# Patient Record
Sex: Female | Born: 1980 | Race: White | Hispanic: No | Marital: Married | Smoking: Former smoker
Health system: Southern US, Community
[De-identification: ages and names within clinical notes are randomized; demographics above are authoritative.]

## PROBLEM LIST (undated history)

## (undated) DIAGNOSIS — J4540 Moderate persistent asthma, uncomplicated: Secondary | ICD-10-CM

## (undated) DIAGNOSIS — J069 Acute upper respiratory infection, unspecified: Secondary | ICD-10-CM

## (undated) DIAGNOSIS — I10 Essential (primary) hypertension: Secondary | ICD-10-CM

## (undated) DIAGNOSIS — G4730 Sleep apnea, unspecified: Secondary | ICD-10-CM

## (undated) DIAGNOSIS — O039 Complete or unspecified spontaneous abortion without complication: Secondary | ICD-10-CM

## (undated) DIAGNOSIS — D649 Anemia, unspecified: Secondary | ICD-10-CM

## (undated) DIAGNOSIS — K529 Noninfective gastroenteritis and colitis, unspecified: Secondary | ICD-10-CM

## (undated) HISTORY — DX: Moderate persistent asthma, uncomplicated: J45.40

## (undated) HISTORY — DX: Acute upper respiratory infection, unspecified: J06.9

## (undated) HISTORY — DX: Complete or unspecified spontaneous abortion without complication: O03.9

---

## 2002-07-15 HISTORY — PX: COLONOSCOPY: SHX174

## 2016-02-02 ENCOUNTER — Emergency Department (HOSPITAL_COMMUNITY)
Admission: EM | Admit: 2016-02-02 | Discharge: 2016-02-02 | Disposition: A | Discharge status: Home / Self Care | Payer: 59 | Attending: Emergency Medicine | Admitting: Emergency Medicine

## 2016-02-02 ENCOUNTER — Encounter (HOSPITAL_COMMUNITY): Payer: Self-pay | Admitting: *Deleted

## 2016-02-02 ENCOUNTER — Emergency Department (HOSPITAL_COMMUNITY): Payer: 59

## 2016-02-02 DIAGNOSIS — Z87891 Personal history of nicotine dependence: Secondary | ICD-10-CM | POA: Diagnosis not present

## 2016-02-02 DIAGNOSIS — R110 Nausea: Secondary | ICD-10-CM

## 2016-02-02 DIAGNOSIS — R1013 Epigastric pain: Secondary | ICD-10-CM | POA: Insufficient documentation

## 2016-02-02 DIAGNOSIS — R1110 Vomiting, unspecified: Secondary | ICD-10-CM | POA: Diagnosis not present

## 2016-02-02 HISTORY — DX: Noninfective gastroenteritis and colitis, unspecified: K52.9

## 2016-02-02 LAB — COMPREHENSIVE METABOLIC PANEL
ALK PHOS: 76 U/L (ref 38–126)
ALT: 12 U/L — AB (ref 14–54)
AST: 19 U/L (ref 15–41)
Albumin: 3.9 g/dL (ref 3.5–5.0)
Anion gap: 11 (ref 5–15)
BUN: 6 mg/dL (ref 6–20)
CHLORIDE: 106 mmol/L (ref 101–111)
CO2: 21 mmol/L — AB (ref 22–32)
Calcium: 9.3 mg/dL (ref 8.9–10.3)
Creatinine, Ser: 0.73 mg/dL (ref 0.44–1.00)
GFR calc Af Amer: >60 mL/min (ref >60)
GFR calc non Af Amer: >60 mL/min (ref >60)
GLUCOSE: 103 mg/dL — AB (ref 65–99)
Potassium: 3.8 mmol/L (ref 3.5–5.1)
SODIUM: 138 mmol/L (ref 135–145)
Total Bilirubin: 0.6 mg/dL (ref 0.3–1.2)
Total Protein: 7.8 g/dL (ref 6.5–8.1)

## 2016-02-02 LAB — URINALYSIS, ROUTINE W REFLEX MICROSCOPIC
BILIRUBIN URINE: NEGATIVE
GLUCOSE, UA: NEGATIVE mg/dL
HGB URINE DIPSTICK: NEGATIVE
Ketones, ur: NEGATIVE mg/dL
Leukocytes, UA: NEGATIVE
Nitrite: NEGATIVE
PROTEIN: NEGATIVE mg/dL
Specific Gravity, Urine: 1.025 (ref 1.005–1.030)
pH: 5 (ref 5.0–8.0)

## 2016-02-02 LAB — LIPASE, BLOOD: LIPASE: 24 U/L (ref 11–51)

## 2016-02-02 LAB — CBC
HCT: 40.3 % (ref 36.0–46.0)
Hemoglobin: 13.6 g/dL (ref 12.0–15.0)
MCH: 27.3 pg (ref 26.0–34.0)
MCHC: 33.7 g/dL (ref 30.0–36.0)
MCV: 80.9 fL (ref 78.0–100.0)
PLATELETS: 359 10*3/uL (ref 150–400)
RBC: 4.98 MIL/uL (ref 3.87–5.11)
RDW: 14.1 % (ref 11.5–15.5)
WBC: 12.8 10*3/uL — ABNORMAL HIGH (ref 4.0–10.5)

## 2016-02-02 LAB — URINALYSIS, MICROSCOPIC (REFLEX): WBC, UA: NONE SEEN WBC/hpf (ref 0–5)

## 2016-02-02 MED ORDER — FAMOTIDINE 20 MG PO TABS
20.0000 mg | ORAL_TABLET | Freq: Once | ORAL | Status: AC
  Administered 2016-02-02: 20 mg via ORAL
  Filled 2016-02-02: qty 1

## 2016-02-02 MED ORDER — ONDANSETRON 4 MG PO TBDP: 4.0000 mg | ORAL_TABLET | ORAL | 0 refills | Status: DC | PRN

## 2016-02-02 MED ORDER — FAMOTIDINE 20 MG PO TABS: 20.0000 mg | ORAL_TABLET | Freq: Two times a day (BID) | ORAL | 0 refills | Status: DC

## 2016-02-02 MED ORDER — GI COCKTAIL ~~LOC~~
30.0000 mL | Freq: Once | ORAL | Status: AC
  Administered 2016-02-02: 30 mL via ORAL
  Filled 2016-02-02: qty 30

## 2016-02-02 MED FILL — ONDANSETRON ODT 4 MG TABLET: 4 | 4 days supply | Qty: 20 | Fill #0

## 2016-02-02 MED FILL — FAMOTIDINE 20 MG TABLET: 20 | 15 days supply | Qty: 30 | Fill #0

## 2016-02-02 NOTE — ED Triage Notes (Signed)
While at work in [place] pt report at midnight feeling gassy & belching that smelt & tasted like feces, pt then vomited x 1, pt reports having a small BM this morning, pt c/o bil upper abd cramping & bloated feeling, pt c/o pain radiating to R back, pt hx of colitis, ambulatory, A&O x4

## 2016-02-02 NOTE — ED Provider Notes (Signed)
[facility] DEPT Provider Note   CSN: [id number] Arrival date & time: 02/02/16  [id number]     History   Chief Complaint Chief Complaint  Patient presents with  . Abdominal Pain    HPI Molly [name] is a 36 y.o. female.  HPI At approximately midnight patient started to get a bloated and uncomfortable sensation in the upper abdomen and epigastrium. She reports that she was belching and that the smell and taste was that of feces. Patient became concerned for possible obstruction. She did go on to have one episode of vomiting. She reports she had just had some Coca-Cola so it was kind of brownish in appearance. She had a small bowel movement this morning. Patient reports at this time the pain has abated considerably relative to what it had been during the night. Pain was radiating into her back. Patient has a distant history of colitis but not ulcerative or Crohn's. No fever. No GU symptoms. Past Medical History:  Diagnosis Date  . Colitis     There are no active problems to display for this patient.   Past Surgical History:  Procedure Laterality Date  . CESAREAN SECTION      OB History    No data available       Home Medications    Prior to Admission medications   Medication Sig Start Date End Date Taking? Authorizing Provider  Ascorbic Acid (VITAMIN C) 100 MG tablet Take 100 mg by mouth daily.   Yes Historical Provider, MD  ELDERBERRY PO Take 1 tablet by mouth daily.   Yes Historical Provider, MD  ibuprofen (ADVIL,MOTRIN) 200 MG tablet Take 200 mg by mouth every 6 (six) hours as needed for moderate pain.   Yes Historical Provider, MD  Multiple Vitamin (MULTIVITAMIN) tablet Take 1 tablet by mouth daily.   Yes Historical Provider, MD  ondansetron (ZOFRAN) 4 MG tablet Take 4 mg by mouth every 8 (eight) hours as needed for nausea or vomiting.   Yes Historical Provider, MD  famotidine (PEPCID) 20 MG tablet Take 1 tablet (20 mg total) by mouth 2 (two) times daily. 02/02/16   Charlesetta Shanks, MD  ondansetron (ZOFRAN ODT) 4 MG disintegrating tablet Take 1 tablet (4 mg total) by mouth every 4 (four) hours as needed for nausea or vomiting. 02/02/16   Charlesetta Shanks, MD    Family History No family history on file.  Social History Social History  Substance Use Topics  . Smoking status: Former Smoker    Types: Cigarettes    Quit date: 02/15/2011  . Smokeless tobacco: Never Used  . Alcohol use 0.6 oz/week    1 Cans of beer per week     Allergies   Lavender oil   Review of Systems Review of Systems 10 Systems reviewed and are negative for acute change except as noted in the HPI.   Physical Exam Updated Vital Signs BP 106/84   Pulse 78   Temp 98 F (36.7 C) (Oral)   Resp 16   Ht 5\' 6"  (1.676 m)   Wt 250 lb (113.4 kg)   LMP 01/12/2016 (Exact Date)   SpO2 98%   BMI 40.35 kg/m   Physical Exam  Constitutional: She is oriented to person, place, and time. She appears well-developed and well-nourished. No distress.  HENT:  Head: Normocephalic and atraumatic.  Eyes: Conjunctivae and EOM are normal.  Neck: Neck supple.  Cardiovascular: Normal rate and regular rhythm.   No murmur heard. Pulmonary/Chest: Effort normal and breath sounds  normal. No respiratory distress.  Abdominal: Soft. She exhibits no distension. There is tenderness. There is no guarding.  Mild epigastric tenderness without guarding. Lower abdomen is nontender.  Musculoskeletal: She exhibits no edema or tenderness.  Neurological: She is alert and oriented to person, place, and time. She exhibits normal muscle tone. Coordination normal.  Skin: Skin is warm and dry.  Psychiatric: She has a normal mood and affect.  Nursing note and vitals reviewed.    ED Treatments / Results  Labs (all labs ordered are listed, but only abnormal results are displayed) Labs Reviewed  COMPREHENSIVE METABOLIC PANEL - Abnormal; Notable for the following:       Result Value   CO2 21 (*)    Glucose, Bld 103  (*)    ALT 12 (*)    All other components within normal limits  CBC - Abnormal; Notable for the following:    WBC 12.8 (*)    All other components within normal limits  URINALYSIS, ROUTINE W REFLEX MICROSCOPIC - Abnormal; Notable for the following:    APPearance TURBID (*)    All other components within normal limits  URINALYSIS, MICROSCOPIC (REFLEX) - Abnormal; Notable for the following:    Bacteria, UA RARE (*)    Squamous Epithelial / LPF 0-5 (*)    All other components within normal limits  LIPASE, BLOOD    EKG  EKG Interpretation None       Radiology Dg Abd Acute W/chest  Result Date: 02/02/2016 CLINICAL DATA:  Acute epigastric abdominal pain, nausea. EXAM: DG ABDOMEN ACUTE W/ 1V CHEST COMPARISON:  None. FINDINGS: There is no evidence of dilated bowel loops or free intraperitoneal air. Phleboliths are noted in the pelvis. Heart size and mediastinal contours are within normal limits. Both lungs are clear. IMPRESSION: There is no evidence of bowel obstruction or ileus. No acute cardiopulmonary disease. Electronically Signed   By: Marijo Conception, M.D.   On: 02/02/2016 10:06    Procedures Procedures (including critical care time)  Medications Ordered in ED Medications  gi cocktail (Maalox,Lidocaine,Donnatal) (30 mLs Oral Given 02/02/16 0946)  famotidine (PEPCID) tablet 20 mg (20 mg Oral Given 02/02/16 0946)     Initial Impression / Assessment and Plan / ED Course  I have reviewed the triage vital signs and the nursing notes.  Pertinent labs & imaging results that were available during my care of the patient were reviewed by me and considered in my medical decision making (see chart for details).  Clinical Course      Final Clinical Impressions(s) / ED Diagnoses   Final diagnoses:  Epigastric pain  Nausea  Pain and nausea have improved significantly. Acute abdominal series shows no signs of obstructive pattern. At this time, abdominal pain precautions are reviewed.  Patient will continue Pepcid for the next week and has Zofran to use when necessary. Return precautions are reviewed.  New Prescriptions New Prescriptions   FAMOTIDINE (PEPCID) 20 MG TABLET    Take 1 tablet (20 mg total) by mouth 2 (two) times daily.   ONDANSETRON (ZOFRAN ODT) 4 MG DISINTEGRATING TABLET    Take 1 tablet (4 mg total) by mouth every 4 (four) hours as needed for nausea or vomiting.     Charlesetta Shanks, MD 02/02/16 1025

## 2016-04-22 DIAGNOSIS — J019 Acute sinusitis, unspecified: Secondary | ICD-10-CM | POA: Diagnosis not present

## 2016-05-04 ENCOUNTER — Encounter (HOSPITAL_COMMUNITY): Payer: Self-pay | Admitting: Emergency Medicine

## 2016-05-04 ENCOUNTER — Emergency Department (HOSPITAL_COMMUNITY)
Admission: EM | Admit: 2016-05-04 | Discharge: 2016-05-04 | Disposition: A | Discharge status: Home / Self Care | Payer: 59 | Attending: Emergency Medicine | Admitting: Emergency Medicine

## 2016-05-04 DIAGNOSIS — Z87891 Personal history of nicotine dependence: Secondary | ICD-10-CM | POA: Diagnosis not present

## 2016-05-04 DIAGNOSIS — R112 Nausea with vomiting, unspecified: Secondary | ICD-10-CM | POA: Diagnosis not present

## 2016-05-04 DIAGNOSIS — R197 Diarrhea, unspecified: Secondary | ICD-10-CM | POA: Insufficient documentation

## 2016-05-04 HISTORY — DX: Anemia, unspecified: D64.9

## 2016-05-04 LAB — CBC
HEMATOCRIT: 42.3 % (ref 36.0–46.0)
HEMOGLOBIN: 13.7 g/dL (ref 12.0–15.0)
MCH: 26.2 pg (ref 26.0–34.0)
MCHC: 32.4 g/dL (ref 30.0–36.0)
MCV: 80.9 fL (ref 78.0–100.0)
Platelets: 366 10*3/uL (ref 150–400)
RBC: 5.23 MIL/uL — ABNORMAL HIGH (ref 3.87–5.11)
RDW: 14.2 % (ref 11.5–15.5)
WBC: 14.3 10*3/uL — ABNORMAL HIGH (ref 4.0–10.5)

## 2016-05-04 LAB — URINALYSIS, ROUTINE W REFLEX MICROSCOPIC
Bilirubin Urine: NEGATIVE
GLUCOSE, UA: NEGATIVE mg/dL
Hgb urine dipstick: NEGATIVE
KETONES UR: NEGATIVE mg/dL
LEUKOCYTES UA: NEGATIVE
NITRITE: NEGATIVE
PH: 5 (ref 5.0–8.0)
Protein, ur: NEGATIVE mg/dL
SPECIFIC GRAVITY, URINE: 1.023 (ref 1.005–1.030)

## 2016-05-04 LAB — COMPREHENSIVE METABOLIC PANEL
ALBUMIN: 3.9 g/dL (ref 3.5–5.0)
ALT: 16 U/L (ref 14–54)
AST: 19 U/L (ref 15–41)
Alkaline Phosphatase: 79 U/L (ref 38–126)
Anion gap: 14 (ref 5–15)
BILIRUBIN TOTAL: 0.7 mg/dL (ref 0.3–1.2)
BUN: 5 mg/dL — AB (ref 6–20)
CHLORIDE: 106 mmol/L (ref 101–111)
CO2: 18 mmol/L — ABNORMAL LOW (ref 22–32)
CREATININE: 0.67 mg/dL (ref 0.44–1.00)
Calcium: 9.5 mg/dL (ref 8.9–10.3)
GFR calc Af Amer: >60 mL/min (ref >60)
GFR calc non Af Amer: >60 mL/min (ref >60)
GLUCOSE: 109 mg/dL — AB (ref 65–99)
POTASSIUM: 4.1 mmol/L (ref 3.5–5.1)
Sodium: 138 mmol/L (ref 135–145)
TOTAL PROTEIN: 7.4 g/dL (ref 6.5–8.1)

## 2016-05-04 LAB — LIPASE, BLOOD: Lipase: 19 U/L (ref 11–51)

## 2016-05-04 LAB — POC URINE PREG, ED: Preg Test, Ur: NEGATIVE

## 2016-05-04 MED ORDER — ONDANSETRON HCL 4 MG/2ML IJ SOLN
4.0000 mg | Freq: Once | INTRAMUSCULAR | Status: AC
  Administered 2016-05-04: 4 mg via INTRAVENOUS
  Filled 2016-05-04: qty 2

## 2016-05-04 MED ORDER — ONDANSETRON 4 MG PO TBDP: 4.0000 mg | ORAL_TABLET | Freq: Three times a day (TID) | ORAL | 0 refills | Status: DC | PRN

## 2016-05-04 MED ORDER — SODIUM CHLORIDE 0.9 % IV BOLUS (SEPSIS)
1000.0000 mL | Freq: Once | INTRAVENOUS | Status: AC
  Administered 2016-05-04: 1000 mL via INTRAVENOUS

## 2016-05-04 NOTE — ED Triage Notes (Signed)
Started having n/v/d around 2:30 today.  Vomited 3 times and had 6-7 diarrhea stools so far.  C/O abdominal cramping.

## 2016-05-04 NOTE — ED Provider Notes (Signed)
[facility] DEPT Provider Note   CSN: [id number] Arrival date & time: 05/04/16  1916     History   Chief Complaint Chief Complaint  Patient presents with  . Nausea  . Emesis    HPI Molly [name] is a 36 y.o. female.  36 year old obese female who presents with acute onset of nausea vomiting and diarrhea slight abdominal crampy pain denies any fever or dysuria. Patient has history of anemia during pregnancy and colitis. She's had no issues with her colitis in quite a while denies any blood or mucus in her stool. She works in the medical field/hospital.      Past Medical History:  Diagnosis Date  . Anemia   . Colitis     There are no active problems to display for this patient.   Past Surgical History:  Procedure Laterality Date  . CESAREAN SECTION      OB History    No data available       Home Medications    Prior to Admission medications   Medication Sig Start Date End Date Taking? Authorizing Provider  Bioflavonoid Products (VITAMIN C) CHEW Chew 2-3 tablets by mouth daily.   Yes Historical Provider, MD  brompheniramine-pseudoephedrine-DM 30-2-10 MG/5ML syrup Take 30 mLs by mouth every 4 (four) hours as needed (cough).  04/22/16  Yes Historical Provider, MD  cetirizine (ZYRTEC) 10 MG tablet Take 10 mg by mouth daily as needed (seasonal allergies).   Yes Historical Provider, MD  ELDERBERRY PO Take 1 tablet by mouth daily.   Yes Historical Provider, MD  ibuprofen (ADVIL,MOTRIN) 200 MG tablet Take 600 mg by mouth every 6 (six) hours as needed for moderate pain.    Yes Historical Provider, MD  Multiple Vitamin (MULTIVITAMIN WITH MINERALS) TABS tablet Take 1 tablet by mouth daily.   Yes Historical Provider, MD  ondansetron (ZOFRAN ODT) 4 MG disintegrating tablet Take 1 tablet (4 mg total) by mouth every 4 (four) hours as needed for nausea or vomiting. 02/02/16  Yes Charlesetta Shanks, MD  RaNITidine HCl (ZANTAC PO) Take 1 tablet by mouth daily as needed (acid reflux).    Yes Historical Provider, MD  famotidine (PEPCID) 20 MG tablet Take 1 tablet (20 mg total) by mouth 2 (two) times daily. Patient not taking: Reported on 05/04/2016 02/02/16   Charlesetta Shanks, MD  ondansetron (ZOFRAN ODT) 4 MG disintegrating tablet Take 1 tablet (4 mg total) by mouth every 8 (eight) hours as needed for nausea or vomiting. 05/04/16   Junius Creamer, NP    Family History No family history on file.  Social History Social History  Substance Use Topics  . Smoking status: Former Smoker    Types: Cigarettes    Quit date: 02/15/2011  . Smokeless tobacco: Never Used  . Alcohol use 0.6 oz/week    1 Cans of beer per week     Allergies   Other and Lavender oil   Review of Systems Review of Systems  Constitutional: Negative for chills and fever.  Respiratory: Negative for cough and shortness of breath.   Gastrointestinal: Positive for abdominal pain, diarrhea, nausea and vomiting.  Genitourinary: Negative for dysuria.  All other systems reviewed and are negative.    Physical Exam Updated Vital Signs BP 112/72   Pulse 92   Temp 99 F (37.2 C) (Oral)   Resp 16   Ht 5\' 6"  (1.676 m)   Wt 113.4 kg   LMP 04/03/2016 (Approximate)   SpO2 99%   BMI 40.35 kg/m  Physical Exam  Constitutional: She appears well-developed.  HENT:  Head: Normocephalic.  Eyes: Pupils are equal, round, and reactive to light.  Neck: Normal range of motion.  Cardiovascular: Normal rate.   Pulmonary/Chest: Effort normal.  Abdominal: Soft. She exhibits no distension. There is no tenderness.  Neurological: She is alert.  Skin: Skin is warm and dry.  Vitals reviewed.    ED Treatments / Results  Labs (all labs ordered are listed, but only abnormal results are displayed) Labs Reviewed  COMPREHENSIVE METABOLIC PANEL - Abnormal; Notable for the following:       Result Value   CO2 18 (*)    Glucose, Bld 109 (*)    BUN 5 (*)    All other components within normal limits  CBC - Abnormal; Notable  for the following:    WBC 14.3 (*)    RBC 5.23 (*)    All other components within normal limits  URINALYSIS, ROUTINE W REFLEX MICROSCOPIC - Abnormal; Notable for the following:    APPearance HAZY (*)    All other components within normal limits  LIPASE, BLOOD  POC URINE PREG, ED    EKG  EKG Interpretation None       Radiology No results found.  Procedures Procedures (including critical care time)  Medications Ordered in ED Medications  sodium chloride 0.9 % bolus 1,000 mL (0 mLs Intravenous Stopped 05/04/16 2150)  ondansetron (ZOFRAN) injection 4 mg (4 mg Intravenous Given 05/04/16 2052)     Initial Impression / Assessment and Plan / ED Course  I have reviewed the triage vital signs and the nursing notes.  Pertinent labs & imaging results that were available during my care of the patient were reviewed by me and considered in my medical decision making (see chart for details).      Labs have been reviewed other than a slightly elevated white count of 14.3 within parameters she'll be given fluids and Zofran are reassessed Patient is tolerating fluids she'll be given a prescription for Zofran and 1 day off from work  Final Clinical Impressions(s) / ED Diagnoses   Final diagnoses:  Nausea vomiting and diarrhea    New Prescriptions New Prescriptions   ONDANSETRON (ZOFRAN ODT) 4 MG DISINTEGRATING TABLET    Take 1 tablet (4 mg total) by mouth every 8 (eight) hours as needed for nausea or vomiting.     Junius Creamer, NP 05/04/16 1700    Tanna Furry, MD 05/11/16 [phone]

## 2016-05-04 NOTE — ED Notes (Signed)
NS IV bolus infusing , IV site intact , unable to give urine specimen at this time , denies pain , respirations unlabored.

## 2016-05-04 NOTE — Discharge Instructions (Signed)
They were evaluated and treated for nausea vomiting and diarrhea and her labs are all within normal parameters her urine test shows no sign of infection nor U pregnant at this time given a prescription for Zofran that he continues for any further episodes of nausea and vomiting also been given dietary guidelines to help with food choices to relieve her diarrhea.

## 2017-01-29 ENCOUNTER — Ambulatory Visit (INDEPENDENT_AMBULATORY_CARE_PROVIDER_SITE_OTHER): Payer: No Typology Code available for payment source | Admitting: Family Medicine

## 2017-01-29 ENCOUNTER — Encounter: Payer: Self-pay | Admitting: Family Medicine

## 2017-01-29 VITALS — BP 136/85 | HR 82 | Temp 97.6°F | Ht 66.0 in | Wt 267.0 lb

## 2017-01-29 DIAGNOSIS — Z13220 Encounter for screening for lipoid disorders: Secondary | ICD-10-CM | POA: Diagnosis not present

## 2017-01-29 DIAGNOSIS — Z131 Encounter for screening for diabetes mellitus: Secondary | ICD-10-CM | POA: Diagnosis not present

## 2017-01-29 DIAGNOSIS — Z9189 Other specified personal risk factors, not elsewhere classified: Secondary | ICD-10-CM

## 2017-01-29 NOTE — Progress Notes (Signed)
BP 136/85   Pulse 82   Temp 97.6 F (36.4 C) (Oral)   Ht _0  (1.676 m)   Wt 267 lb (121.1 kg)   BMI 43.09 kg/m    Subjective:    Patient ID: Molly [name], female    DOB: 05-03-1980, 37 y.o.   MRN: [id number]  HPI: Molly [name] is a 37 y.o. female presenting on 01/29/2017 for Establish Care   HPI Fertility issues Patient is coming in as a new patient to discuss fertility issues.  Her and her husband have been trying to get pregnant or at least not trying to not get pregnant.  This is been going on over the past couple years since her last child was born 3 years ago.  She says she has been having regular menstrual cycles and they have not been heavy or excessive.  She denies any abdominal pain or pelvic pain.  She is also coming in today for labs because she has not had them in a while.  Relevant past medical, surgical, family and social history reviewed and updated as indicated. Interim medical history since our last visit reviewed. Allergies and medications reviewed and updated.  Review of Systems  Constitutional: Negative for chills and fever.  Eyes: Negative for visual disturbance.  Respiratory: Negative for chest tightness and shortness of breath.   Cardiovascular: Negative for chest pain and leg swelling.  Gastrointestinal: Negative for abdominal pain.  Genitourinary: Negative for difficulty urinating, dysuria, menstrual problem, vaginal bleeding, vaginal discharge and vaginal pain.  Musculoskeletal: Negative for back pain and gait problem.  Skin: Negative for rash.  Neurological: Negative for light-headedness and headaches.  Psychiatric/Behavioral: Negative for agitation and behavioral problems.  All other systems reviewed and are negative.   Per HPI unless specifically indicated above  Social History   Socioeconomic History  . Marital status: Married    Spouse name: Not on file  . Number of children: Not on file  . Years of education: Not on file  . Highest  education level: Not on file  Social Needs  . Financial resource strain: Not on file  . Food insecurity - worry: Not on file  . Food insecurity - inability: Not on file  . Transportation needs - medical: Not on file  . Transportation needs - non-medical: Not on file  Occupational History  . Not on file  Tobacco Use  . Smoking status: Former Smoker    Types: Cigarettes    Last attempt to quit: 02/15/2011    Years since quitting: 5.9  . Smokeless tobacco: Never Used  Substance and Sexual Activity  . Alcohol use: Yes    Alcohol/week: 0.6 oz    Types: 1 Cans of beer per week    Comment: occasional  . Drug use: No  . Sexual activity: Yes    Birth control/protection: None    Comment: married 6 years, trying to get pregnant  Other Topics Concern  . Not on file  Social History Narrative  . Not on file    Past Surgical History:  Procedure Laterality Date  . CESAREAN SECTION      Family History  Problem Relation Age of Onset  . Diabetes Mother   . Hypertension Mother   . Hyperlipidemia Mother   . Diabetes Maternal Grandmother   . Heart disease Maternal Grandmother   . Cancer Maternal Grandmother        sarcoma in arm  . Kidney disease Maternal Grandmother   . Heart disease Maternal Grandfather  congestive heart failure  . Kidney disease Maternal Grandfather   . Cancer Maternal Grandfather        bladder  . Hypertension Maternal Grandfather   . Heart disease Paternal Grandmother   . Heart disease Paternal Grandfather   . COPD Paternal Grandfather   . Mental illness Paternal Grandfather        PTSD    Allergies as of 01/29/2017      Reactions   Other Anaphylaxis   Reaction to hay (something in [place] pig's cage)   Lavender Oil Hives      Medication List        Accurate as of 01/29/17  3:53 PM. Always use your most recent med list.          cetirizine 10 MG tablet Commonly known as:  ZYRTEC Take 10 mg by mouth daily as needed (seasonal allergies).     ELDERBERRY PO Take 1 tablet by mouth daily.   famotidine 20 MG tablet Commonly known as:  PEPCID Take 1 tablet (20 mg total) by mouth 2 (two) times daily.   ibuprofen 200 MG tablet Commonly known as:  ADVIL,MOTRIN Take 600 mg by mouth every 6 (six) hours as needed for moderate pain.   Melatonin 5 MG Tabs Take by mouth as needed.   multivitamin with minerals Tabs tablet Take 1 tablet by mouth daily.   Vitamin C Chew Chew 2-3 tablets by mouth daily.   ZANTAC PO Take 1 tablet by mouth daily as needed (acid reflux).          Objective:    BP 136/85   Pulse 82   Temp 97.6 F (36.4 C) (Oral)   Ht _0  (1.676 m)   Wt 267 lb (121.1 kg)   BMI 43.09 kg/m   Wt Readings from Last 3 Encounters:  01/29/17 267 lb (121.1 kg)  05/04/16 250 lb (113.4 kg)  02/02/16 250 lb (113.4 kg)    Physical Exam  Constitutional: She is oriented to person, place, and time. She appears well-developed and well-nourished. No distress.  HENT:  Right Ear: External ear normal.  Left Ear: External ear normal.  Mouth/Throat: Oropharynx is clear and moist. No oropharyngeal exudate.  Eyes: Conjunctivae are normal.  Neck: Neck supple. No thyromegaly present.  Cardiovascular: Normal rate, regular rhythm, normal heart sounds and intact distal pulses.  No murmur heard. Pulmonary/Chest: Effort normal and breath sounds normal. No respiratory distress. She has no wheezes. She has no rales.  Abdominal: Soft. Bowel sounds are normal. She exhibits no distension. There is no tenderness. There is no rebound and no guarding.  Musculoskeletal: Normal range of motion. She exhibits no edema or tenderness.  Lymphadenopathy:    She has no cervical adenopathy.  Neurological: She is alert and oriented to person, place, and time. Coordination normal.  Skin: Skin is warm and dry. No rash noted. She is not diaphoretic.  Psychiatric: She has a normal mood and affect. Her behavior is normal.  Nursing note and vitals  reviewed.       Assessment & Plan:   Problem List Items Addressed This Visit    None    Visit Diagnoses    At risk for fertility problems    -  Primary   Has been trying to get pregnant for 2 years, has one previous pregnancy and one previous miscarriage   Relevant Orders   CBC with Differential/Platelet   TSH   Testosterone,Free and Total   Estrogens, Total   Lipid  screening       Relevant Orders   Lipid panel   Diabetes mellitus screening       Relevant Orders   CMP14+EGFR       Follow up plan: Return in about 3 months (around 04/29/2017), or if symptoms worsen or fail to improve, for Recheck on fertility problems and well woman exam and Pap.  Caryl Pina, MD [facility] 01/29/2017, 3:53 PM

## 2017-01-31 LAB — CBC WITH DIFFERENTIAL/PLATELET
BASOS: 0 %
Basophils Absolute: 0 10*3/uL (ref 0.0–0.2)
EOS (ABSOLUTE): 0.2 10*3/uL (ref 0.0–0.4)
EOS: 2 %
HEMATOCRIT: 40.1 % (ref 34.0–46.6)
Hemoglobin: 13.4 g/dL (ref 11.1–15.9)
IMMATURE GRANULOCYTES: 0 %
Immature Grans (Abs): 0 10*3/uL (ref 0.0–0.1)
LYMPHS ABS: 2.6 10*3/uL (ref 0.7–3.1)
Lymphs: 25 %
MCH: 27.3 pg (ref 26.6–33.0)
MCHC: 33.4 g/dL (ref 31.5–35.7)
MCV: 82 fL (ref 79–97)
MONOS ABS: 0.6 10*3/uL (ref 0.1–0.9)
Monocytes: 6 %
NEUTROS PCT: 67 %
Neutrophils Absolute: 7.1 10*3/uL — ABNORMAL HIGH (ref 1.4–7.0)
Platelets: 428 10*3/uL — ABNORMAL HIGH (ref 150–379)
RBC: 4.9 x10E6/uL (ref 3.77–5.28)
RDW: 14.5 % (ref 12.3–15.4)
WBC: 10.6 10*3/uL (ref 3.4–10.8)

## 2017-01-31 LAB — CMP14+EGFR
A/G RATIO: 1.4 (ref 1.2–2.2)
ALT: 10 IU/L (ref 0–32)
AST: 11 IU/L (ref 0–40)
Albumin: 4.2 g/dL (ref 3.5–5.5)
Alkaline Phosphatase: 87 IU/L (ref 39–117)
BUN / CREAT RATIO: 13 (ref 9–23)
BUN: 8 mg/dL (ref 6–20)
Bilirubin Total: 0.2 mg/dL (ref 0.0–1.2)
CALCIUM: 9.4 mg/dL (ref 8.7–10.2)
CO2: 25 mmol/L (ref 20–29)
Chloride: 104 mmol/L (ref 96–106)
Creatinine, Ser: 0.63 mg/dL (ref 0.57–1.00)
GFR, EST AFRICAN AMERICAN: 134 mL/min/{1.73_m2} (ref >59)
GFR, EST NON AFRICAN AMERICAN: 117 mL/min/{1.73_m2} (ref >59)
GLOBULIN, TOTAL: 3 g/dL (ref 1.5–4.5)
Glucose: 93 mg/dL (ref 65–99)
Potassium: 4.3 mmol/L (ref 3.5–5.2)
SODIUM: 141 mmol/L (ref 134–144)
Total Protein: 7.2 g/dL (ref 6.0–8.5)

## 2017-01-31 LAB — LIPID PANEL
CHOLESTEROL TOTAL: 131 mg/dL (ref 100–199)
Chol/HDL Ratio: 3.2 ratio (ref 0.0–4.4)
HDL: 41 mg/dL (ref >39)
LDL Calculated: 74 mg/dL (ref 0–99)
Triglycerides: 81 mg/dL (ref 0–149)
VLDL CHOLESTEROL CAL: 16 mg/dL (ref 5–40)

## 2017-01-31 LAB — TESTOSTERONE,FREE AND TOTAL
Testosterone, Free: 0.8 pg/mL (ref 0.0–4.2)
Testosterone: 18 ng/dL (ref 8–48)

## 2017-01-31 LAB — TSH: TSH: 2.47 u[IU]/mL (ref 0.450–4.500)

## 2017-01-31 LAB — ESTROGENS, TOTAL: Estrogen: 196 pg/mL

## 2017-02-16 ENCOUNTER — Telehealth: Payer: No Typology Code available for payment source | Admitting: Family

## 2017-02-16 DIAGNOSIS — J111 Influenza due to unidentified influenza virus with other respiratory manifestations: Secondary | ICD-10-CM | POA: Diagnosis not present

## 2017-02-16 MED ORDER — OSELTAMIVIR PHOSPHATE 75 MG PO CAPS: 75.0000 mg | ORAL_CAPSULE | Freq: Two times a day (BID) | ORAL | 0 refills | Status: DC

## 2017-02-16 NOTE — Progress Notes (Signed)
Thank you for the details you included in the comment boxes. Those details are very helpful in determining the best course of treatment for you and help us to provide the best care.  E visit for Flu like symptoms   We are sorry that you are not feeling well.  Here is how we plan to help! Based on what you have shared with me it looks like you may have a respiratory virus that may be influenza.  Influenza or "the flu" is   an infection caused by a respiratory virus. The flu virus is highly contagious and persons who did not receive their yearly flu vaccination may "catch" the flu from close contact.  We have anti-viral medications to treat the viruses that cause this infection. They are not a "cure" and only shorten the course of the infection. These prescriptions are most effective when they are given within the first 2 days of "flu" symptoms. Antiviral medication are indicated if you have a high risk of complications from the flu. You should  also consider an antiviral medication if you are in close contact with someone who is at risk. These medications can help patients avoid complications from the flu  but have side effects that you should know. Possible side effects from Tamiflu or oseltamivir include nausea, vomiting, diarrhea, dizziness, headaches, eye redness, sleep problems or other respiratory symptoms. You should not take Tamiflu if you have an allergy to oseltamivir or any to the ingredients in Tamiflu.  Based upon your symptoms and potential risk factors I have prescribed Oseltamivir (Tamiflu).  It has been sent to your designated pharmacy.  You will take one 75 mg capsule orally twice a day for the next 5 days.  ANYONE WHO HAS FLU SYMPTOMS SHOULD: . Stay home. The flu is highly contagious and going out or to work exposes others! . Be sure to drink plenty of fluids. Water is fine as well as fruit juices, sodas and electrolyte beverages. You may want to stay away from caffeine or alcohol.  If you are nauseated, try taking small sips of liquids. How do you know if you are getting enough fluid? Your urine should be a pale yellow or almost colorless. . Get rest. . Taking a steamy shower or using a humidifier may help nasal congestion and ease sore throat pain. Using a saline nasal spray works much the same way. . Cough drops, hard candies and sore throat lozenges may ease your cough. . Line up a caregiver. Have someone check on you regularly.   GET HELP RIGHT AWAY IF: . You cannot keep down liquids or your medications. . You become short of breath . Your fell like you are going to pass out or loose consciousness. . Your symptoms persist after you have completed your treatment plan MAKE SURE YOU   Understand these instructions.  Will watch your condition.  Will get help right away if you are not doing well or get worse.  Your e-visit answers were reviewed by a board certified advanced clinical practitioner to complete your personal care plan.  Depending on the condition, your plan could have included both over the counter or prescription medications.  If there is a problem please reply  once you have received a response from your provider.  Your safety is important to us.  If you have drug allergies check your prescription carefully.    You can use MyChart to ask questions about today's visit, request a non-urgent call back, or ask for   a work or school excuse for 24 hours related to this e-Visit. If it has been greater than 24 hours you will need to follow up with your provider, or enter a new e-Visit to address those concerns.  You will get an e-mail in the next two days asking about your experience.  I hope that your e-visit has been valuable and will speed your recovery. Thank you for using e-visits.   

## 2017-04-30 ENCOUNTER — Ambulatory Visit (INDEPENDENT_AMBULATORY_CARE_PROVIDER_SITE_OTHER): Payer: No Typology Code available for payment source | Admitting: Family Medicine

## 2017-04-30 ENCOUNTER — Encounter: Payer: Self-pay | Admitting: Family Medicine

## 2017-04-30 VITALS — BP 131/81 | HR 96 | Temp 98.6°F | Ht 66.0 in | Wt 261.0 lb

## 2017-04-30 DIAGNOSIS — Z23 Encounter for immunization: Secondary | ICD-10-CM

## 2017-04-30 DIAGNOSIS — D229 Melanocytic nevi, unspecified: Secondary | ICD-10-CM

## 2017-04-30 DIAGNOSIS — Z01419 Encounter for gynecological examination (general) (routine) without abnormal findings: Secondary | ICD-10-CM

## 2017-04-30 NOTE — Addendum Note (Signed)
Addended by: Michaela Corner on: 04/30/2017 11:41 AM   Modules accepted: Orders

## 2017-04-30 NOTE — Progress Notes (Signed)
BP 131/81   Pulse 96   Temp 98.6 F (37 C) (Oral)   Ht 5' 6"  (1.676 m)   Wt 261 lb (118.4 kg)   BMI 42.13 kg/m    Subjective:    Patient ID: Molly [name], female    DOB: [birth year], 37 y.o.   MRN: [id number]  HPI: Molly [name] is a 37 y.o. female presenting on 04/30/2017 for Gynecologic Exam and Mole on back   HPI Well woman exam and gynecological exam Patient is coming in for routine gynecological exam and Pap smear.  She is currently trying to get pregnant has been trying for the past 3 months following schedule.  She denies any issues with her breasts except there are dense.  She denies any vaginal discharge or issues or bleeding.  She has been having cycles every month and has been trying to follow them and having intercourse between 7 and 14 days.  She says her cycles have been lasting closer to 30 days so she will increase it now to 7-21 days but still in for the peak around 14 days.  She does have one mole on her left upper back near her axilla that catches on her bra bleeds sometimes and she wanted to get it checked out.  Relevant past medical, surgical, family and social history reviewed and updated as indicated. Interim medical history since our last visit reviewed. Allergies and medications reviewed and updated.  Review of Systems  Constitutional: Negative for chills and fever.  HENT: Negative for ear pain and tinnitus.   Eyes: Negative for pain.  Respiratory: Negative for cough, shortness of breath and wheezing.   Cardiovascular: Negative for chest pain, palpitations and leg swelling.  Gastrointestinal: Negative for abdominal pain, blood in stool, constipation and diarrhea.  Genitourinary: Negative for dysuria, hematuria and menstrual problem.  Musculoskeletal: Negative for back pain and myalgias.  Skin: Negative for rash.  Neurological: Negative for dizziness, weakness and headaches.  Psychiatric/Behavioral: Negative for suicidal ideas.    Per HPI unless  specifically indicated above   Allergies as of 04/30/2017      Reactions   Other Anaphylaxis   Reaction to hay (something in [place] pig's cage)   Lavender Oil Hives      Medication List        Accurate as of 04/30/17 11:03 AM. Always use your most recent med list.          cetirizine 10 MG tablet Commonly known as:  ZYRTEC Take 10 mg by mouth daily as needed (seasonal allergies).   ELDERBERRY PO Take 1 tablet by mouth daily.   ibuprofen 200 MG tablet Commonly known as:  ADVIL,MOTRIN Take 600 mg by mouth every 6 (six) hours as needed for moderate pain.   Melatonin 5 MG Tabs Take by mouth as needed.   multivitamin with minerals Tabs tablet Take 1 tablet by mouth daily.   Vitamin C Chew Chew 2-3 tablets by mouth daily.   ZANTAC PO Take 1 tablet by mouth daily as needed (acid reflux).          Objective:    BP 131/81   Pulse 96   Temp 98.6 F (37 C) (Oral)   Ht 5' 6"  (1.676 m)   Wt 261 lb (118.4 kg)   BMI 42.13 kg/m   Wt Readings from Last 3 Encounters:  04/30/17 261 lb (118.4 kg)  01/29/17 267 lb (121.1 kg)  05/04/16 250 lb (113.4 kg)    Physical Exam  Constitutional: She  is oriented to person, place, and time. She appears well-developed and well-nourished. No distress.  Eyes: Conjunctivae are normal.  Neck: Neck supple. No thyromegaly present.  Cardiovascular: Normal rate, regular rhythm, normal heart sounds and intact distal pulses.  No murmur heard. Pulmonary/Chest: Effort normal and breath sounds normal. No respiratory distress. She has no wheezes. She has no rales. Right breast exhibits no inverted nipple, no mass, no nipple discharge, no skin change and no tenderness. Left breast exhibits no inverted nipple, no mass, no nipple discharge, no skin change and no tenderness. No breast swelling, tenderness, discharge or bleeding. Breasts are symmetrical.  She has dense breast tissue bilaterally but no palpable nodules  Abdominal: Soft. Bowel sounds are  normal. She exhibits no distension. There is no tenderness. There is no rebound and no guarding.  Genitourinary: Vagina normal and uterus normal. Pelvic exam was performed with patient supine. There is no rash or lesion on the right labia. There is no rash or lesion on the left labia. Uterus is not deviated, not enlarged, not fixed and not tender. Cervix exhibits no motion tenderness, no discharge and no friability. Right adnexum displays no mass and no tenderness. Left adnexum displays no mass and no tenderness.  Musculoskeletal: Normal range of motion. She exhibits no edema or tenderness.  Lymphadenopathy:    She has no cervical adenopathy.    She has no axillary adenopathy.  Neurological: She is alert and oriented to person, place, and time. Coordination normal.  Skin: Skin is warm and dry. No rash noted. She is not diaphoretic.  Psychiatric: She has a normal mood and affect. Her behavior is normal.  Nursing note and vitals reviewed.   Results for orders placed or performed in visit on 01/29/17  Estrogens, Total  Result Value Ref Range   Estrogen 196 pg/mL  Testosterone,Free and Total  Result Value Ref Range   Testosterone 18 8 - 48 ng/dL   Testosterone, Free 0.8 0.0 - 4.2 pg/mL  TSH  Result Value Ref Range   TSH 2.470 0.450 - 4.500 uIU/mL  Lipid panel  Result Value Ref Range   Cholesterol, Total 131 100 - 199 mg/dL   Triglycerides 81 0 - 149 mg/dL   HDL 41 >39 mg/dL   VLDL Cholesterol Cal 16 5 - 40 mg/dL   LDL Calculated 74 0 - 99 mg/dL   Chol/HDL Ratio 3.2 0.0 - 4.4 ratio  CBC with Differential/Platelet  Result Value Ref Range   WBC 10.6 3.4 - 10.8 x10E3/uL   RBC 4.90 3.77 - 5.28 x10E6/uL   Hemoglobin 13.4 11.1 - 15.9 g/dL   Hematocrit 40.1 34.0 - 46.6 %   MCV 82 79 - 97 fL   MCH 27.3 26.6 - 33.0 pg   MCHC 33.4 31.5 - 35.7 g/dL   RDW 14.5 12.3 - 15.4 %   Platelets 428 (H) 150 - 379 x10E3/uL   Neutrophils 67 Not Estab. %   Lymphs 25 Not Estab. %   Monocytes 6 Not  Estab. %   Eos 2 Not Estab. %   Basos 0 Not Estab. %   Neutrophils Absolute 7.1 (H) 1.4 - 7.0 x10E3/uL   Lymphocytes Absolute 2.6 0.7 - 3.1 x10E3/uL   Monocytes Absolute 0.6 0.1 - 0.9 x10E3/uL   EOS (ABSOLUTE) 0.2 0.0 - 0.4 x10E3/uL   Basophils Absolute 0.0 0.0 - 0.2 x10E3/uL   Immature Granulocytes 0 Not Estab. %   Immature Grans (Abs) 0.0 0.0 - 0.1 x10E3/uL  CMP14+EGFR  Result Value Ref  Range   Glucose 93 65 - 99 mg/dL   BUN 8 6 - 20 mg/dL   Creatinine, Ser 0.63 0.57 - 1.00 mg/dL   GFR calc non Af Amer 117 >59 mL/min/1.73   GFR calc Af Amer 134 >59 mL/min/1.73   BUN/Creatinine Ratio 13 9 - 23   Sodium 141 134 - 144 mmol/L   Potassium 4.3 3.5 - 5.2 mmol/L   Chloride 104 96 - 106 mmol/L   CO2 25 20 - 29 mmol/L   Calcium 9.4 8.7 - 10.2 mg/dL   Total Protein 7.2 6.0 - 8.5 g/dL   Albumin 4.2 3.5 - 5.5 g/dL   Globulin, Total 3.0 1.5 - 4.5 g/dL   Albumin/Globulin Ratio 1.4 1.2 - 2.2   Bilirubin Total 0.2 0.0 - 1.2 mg/dL   Alkaline Phosphatase 87 39 - 117 IU/L   AST 11 0 - 40 IU/L   ALT 10 0 - 32 IU/L      Assessment & Plan:   Problem List Items Addressed This Visit    None    Visit Diagnoses    Well woman exam with routine gynecological exam    -  Primary   Relevant Orders   Pap IG, rfx HPV all pth   Atypical mole       Mole has become irritated and catches on her bra and will bleed sometimes       Follow up plan: Return in about 3 months (around 07/30/2017), or if symptoms worsen or fail to improve, for Return in 3 months for mole removal and fertility check.  Counseling provided for all of the vaccine components No orders of the defined types were placed in this encounter.   Caryl Pina, MD [facility] 04/30/2017, 11:03 AM

## 2017-05-02 ENCOUNTER — Encounter: Payer: Self-pay | Admitting: *Deleted

## 2017-05-02 LAB — PAP IG, RFX HPV ALL PTH: PAP SMEAR COMMENT: 0

## 2017-05-07 ENCOUNTER — Ambulatory Visit (INDEPENDENT_AMBULATORY_CARE_PROVIDER_SITE_OTHER): Payer: No Typology Code available for payment source | Admitting: Family Medicine

## 2017-05-07 ENCOUNTER — Encounter: Payer: Self-pay | Admitting: Family Medicine

## 2017-05-07 VITALS — BP 127/87 | HR 90 | Temp 98.0°F | Ht 66.0 in | Wt 261.0 lb

## 2017-05-07 DIAGNOSIS — D229 Melanocytic nevi, unspecified: Secondary | ICD-10-CM

## 2017-05-07 DIAGNOSIS — L918 Other hypertrophic disorders of the skin: Secondary | ICD-10-CM

## 2017-05-07 NOTE — Progress Notes (Signed)
BP 127/87   Pulse 90   Temp 98 F (36.7 C) (Oral)   Ht 5\' 6"  (1.676 m)   Wt 261 lb (118.4 kg)   BMI 42.13 kg/m    Subjective:    Patient ID: Molly [name], female    DOB: [birth year], 37 y.o.   MRN: [id number]  HPI: Molly [name] is a 37 y.o. female presenting on 05/07/2017 for Mole removal   HPI Moles and skin tag removal Patient has a mole on left upper back near left axilla.  The mole has been increasing in size and gets caught on her bra and snags and then bleeds sometimes.  She also has skin tags under both axilla that she would like to get removed because they are irritated.  Relevant past medical, surgical, family and social history reviewed and updated as indicated. Interim medical history since our last visit reviewed. Allergies and medications reviewed and updated.  Review of Systems  Constitutional: Negative for chills and fever.  Respiratory: Negative for chest tightness and shortness of breath.   Cardiovascular: Negative for chest pain and leg swelling.  Skin: Negative for rash.  Psychiatric/Behavioral: Negative for agitation and behavioral problems.  All other systems reviewed and are negative.   Per HPI unless specifically indicated above   Allergies as of 05/07/2017      Reactions   Other Anaphylaxis   Reaction to hay (something in [place] pig's cage)   Lavender Oil Hives      Medication List        Accurate as of 05/07/17 11:11 AM. Always use your most recent med list.          cetirizine 10 MG tablet Commonly known as:  ZYRTEC Take 10 mg by mouth daily as needed (seasonal allergies).   ELDERBERRY PO Take 1 tablet by mouth daily.   ibuprofen 200 MG tablet Commonly known as:  ADVIL,MOTRIN Take 600 mg by mouth every 6 (six) hours as needed for moderate pain.   Melatonin 5 MG Tabs Take by mouth as needed.   multivitamin with minerals Tabs tablet Take 1 tablet by mouth daily.   Vitamin C Chew Chew 2-3 tablets by mouth daily.   ZANTAC  PO Take 1 tablet by mouth daily as needed (acid reflux).          Objective:    BP 127/87   Pulse 90   Temp 98 F (36.7 C) (Oral)   Ht 5\' 6"  (1.676 m)   Wt 261 lb (118.4 kg)   BMI 42.13 kg/m   Wt Readings from Last 3 Encounters:  05/07/17 261 lb (118.4 kg)  04/30/17 261 lb (118.4 kg)  01/29/17 267 lb (121.1 kg)    Physical Exam  Constitutional: She is oriented to person, place, and time. She appears well-developed and well-nourished. No distress.  Eyes: Conjunctivae are normal.  Musculoskeletal: Normal range of motion.  Neurological: She is alert and oriented to person, place, and time. Coordination normal.  Skin: Skin is warm and dry. Lesion (3 small skin tags) noted. No rash noted. She is not diaphoretic.     Psychiatric: She has a normal mood and affect. Her behavior is normal.  Nursing note and vitals reviewed.   Skin tag removal: Using forceps and scissors excised 3skin tag. Used silver nitrate sticks for hemostasis and pressure dressing with topical antibiotic. Patient tolerated well and had minimal bleeding.  Skin lesion removal: % lidocaine with epinephrine was used for local anesthesia, 62mL.  Topical Betadine used  for cleansing.  Shave excision was made and silver nitrate used for hemostasis and topical antibiotic was used and then it was covered by 4 x 4 and tape told in place. Procedure was tolerated well     Assessment & Plan:   Problem List Items Addressed This Visit    None    Visit Diagnoses    Atypical mole    -  Primary   Left posterior axilla, raised 0.2 x 0.2 x 0.2 cm lesion, patient says has changed and grown in the past year   Relevant Orders   Pathology (Completed)   Skin tag       Removed 1 skin tag from right axilla and 2 from left       Follow up plan: Return if symptoms worsen or fail to improve.  Counseling provided for all of the vaccine components No orders of the defined types were placed in this encounter.   Caryl Pina,  MD [facility] 05/07/2017, 11:11 AM

## 2017-05-09 LAB — PATHOLOGY

## 2017-06-09 ENCOUNTER — Encounter: Payer: Self-pay | Admitting: Family Medicine

## 2017-06-17 ENCOUNTER — Encounter: Payer: Self-pay | Admitting: Family Medicine

## 2017-06-17 ENCOUNTER — Ambulatory Visit (INDEPENDENT_AMBULATORY_CARE_PROVIDER_SITE_OTHER): Payer: No Typology Code available for payment source | Admitting: Family Medicine

## 2017-06-17 DIAGNOSIS — F411 Generalized anxiety disorder: Secondary | ICD-10-CM

## 2017-06-17 MED ORDER — BUPROPION HCL ER (XL) 150 MG PO TB24: 150.0000 mg | ORAL_TABLET | Freq: Every day | ORAL | 1 refills | Status: DC

## 2017-06-17 NOTE — Progress Notes (Signed)
BP 132/90 (BP Location: Left Arm)   Pulse 85   Temp (!) 97.4 F (36.3 C) (Oral)   Ht 5\' 6"  (1.676 m)   Wt 266 lb (120.7 kg)   LMP 06/03/2017   BMI 42.93 kg/m    Subjective:    Patient ID: Molly [name], female    DOB: 1980-09-23, 37 y.o.   MRN: [id number]  HPI: Molly [name] is a 37 y.o. female presenting on 06/17/2017 for Stress (stress - caring for someone else - mentally drained )   HPI Anxiety and stress Patient is coming in today to be seen for anxiety and stress.  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  She says is been building up to the point where she has been irritable and snapping at her own child and she does not want to be like that would be that person.  She denies any major depression or suicidal ideations or thoughts of hurting herself.  She just does not want to have the mood swings and the irritability like she is having currently.  She says she actually looks forward to the stresses of work because it gets her out of home and not having to deal with the stresses at home currently. Depression screen [facility] 2/9 06/17/2017 05/07/2017 04/30/2017 01/29/2017  Decreased Interest 0 0 0 0  Down, Depressed, Hopeless 1 0 - 0  PHQ - 2 Score 1 0 0 0     Relevant past medical, surgical, family and social history reviewed and updated as indicated. Interim medical history since our last visit reviewed. Allergies and medications reviewed and updated.  Review of Systems  Constitutional: Negative for chills and fever.  Eyes: Negative for visual disturbance.  Respiratory: Negative for chest tightness and shortness of breath.   Cardiovascular: Negative for chest pain and leg swelling.  Musculoskeletal: Negative for back pain and gait problem.  Skin: Negative for rash.  Neurological: Negative for light-headedness and headaches.  Psychiatric/Behavioral: Negative for  agitation, behavioral problems, dysphoric mood, self-injury, sleep disturbance and suicidal ideas. The patient is nervous/anxious.   All other systems reviewed and are negative.   Per HPI unless specifically indicated above   Allergies as of 06/17/2017      Reactions   Other Anaphylaxis   Reaction to hay (something in [place] pig's cage)   Lavender Oil Hives      Medication List        Accurate as of 06/17/17  1:34 PM. Always use your most recent med list.          buPROPion 150 MG 24 hr tablet Commonly known as:  WELLBUTRIN XL Take 1 tablet (150 mg total) by mouth daily.   cetirizine 10 MG tablet Commonly known as:  ZYRTEC Take 10 mg by mouth daily as needed (seasonal allergies).   ELDERBERRY PO Take 1 tablet by mouth daily.   ibuprofen 200 MG tablet Commonly known as:  ADVIL,MOTRIN Take 600 mg by mouth every 6 (six) hours as needed for moderate pain.   Melatonin 5 MG Tabs Take by mouth as needed.   multivitamin with minerals Tabs tablet Take 1 tablet by mouth daily.   Vitamin C Chew Chew 2-3 tablets by mouth daily.   ZANTAC PO Take 1 tablet by mouth daily as needed (acid reflux).  Objective:    BP 132/90 (BP Location: Left Arm)   Pulse 85   Temp (!) 97.4 F (36.3 C) (Oral)   Ht 5\' 6"  (1.676 m)   Wt 266 lb (120.7 kg)   LMP 06/03/2017   BMI 42.93 kg/m   Wt Readings from Last 3 Encounters:  06/17/17 266 lb (120.7 kg)  05/07/17 261 lb (118.4 kg)  04/30/17 261 lb (118.4 kg)    Physical Exam  Constitutional: She is oriented to person, place, and time. She appears well-developed and well-nourished. No distress.  Eyes: Conjunctivae are normal.  Neck: Neck supple. No thyromegaly present.  Cardiovascular: Normal rate, regular rhythm, normal heart sounds and intact distal pulses.  No murmur heard. Pulmonary/Chest: Effort normal and breath sounds normal. No respiratory distress. She has no wheezes.  Lymphadenopathy:    She has no cervical  adenopathy.  Neurological: She is alert and oriented to person, place, and time. Coordination normal.  Skin: Skin is warm and dry. No rash noted. She is not diaphoretic.  Psychiatric: Her behavior is normal. Her mood appears anxious. She exhibits a depressed mood. She expresses no suicidal ideation. She expresses no suicidal plans.  Nursing note and vitals reviewed.       Assessment & Plan:   Problem List Items Addressed This Visit      Other   Generalized anxiety disorder   Relevant Medications   buPROPion (WELLBUTRIN XL) 150 MG 24 hr tablet     Start Wellbutrin and recommended for the patient to do at least 6 months on it, will see back in 4 weeks to see how she is doing.  Follow up plan: Return in about 1 month (around 07/15/2017), or if symptoms worsen or fail to improve, for Recheck anxiety.  Counseling provided for all of the vaccine components No orders of the defined types were placed in this encounter.   Caryl Pina, MD [facility] 06/17/2017, 1:34 PM

## 2017-07-05 ENCOUNTER — Ambulatory Visit (INDEPENDENT_AMBULATORY_CARE_PROVIDER_SITE_OTHER): Payer: No Typology Code available for payment source | Admitting: Family Medicine

## 2017-07-05 ENCOUNTER — Encounter: Payer: Self-pay | Admitting: Family Medicine

## 2017-07-05 VITALS — BP 123/89 | HR 79 | Temp 97.1°F | Ht 66.0 in | Wt 268.2 lb

## 2017-07-05 DIAGNOSIS — H00022 Hordeolum internum right lower eyelid: Secondary | ICD-10-CM | POA: Diagnosis not present

## 2017-07-05 MED ORDER — ERYTHROMYCIN 5 MG/GM OP OINT: 1.0000 "application " | TOPICAL_OINTMENT | Freq: Three times a day (TID) | OPHTHALMIC | 0 refills | Status: AC

## 2017-07-05 NOTE — Patient Instructions (Addendum)
Hordeolum A stye is a bump on your eyelid caused by a bacterial infection. A stye can form inside the eyelid (internal stye) or outside the eyelid (external stye). An internal stye may be caused by an infected oil-producing gland inside your eyelid. An external stye may be caused by an infection at the base of your eyelash (hair follicle). Styes are very common. Anyone can get them at any age. They usually occur in just one eye, but you may have more than one in either eye. What are the causes? The infection is almost always caused by bacteria called Staphylococcus aureus. This is a common type of bacteria that lives on your skin. What increases the risk? You may be at higher risk for a stye if you have had one before. You may also be at higher risk if you have:  Diabetes.  Long-term illness.  Long-term eye redness.  A skin condition called seborrhea.  High fat levels in your blood (lipids).  What are the signs or symptoms? Eyelid pain is the most common symptom of a stye. Internal styes are more painful than external styes. Other signs and symptoms may include:  Painful swelling of your eyelid.  A scratchy feeling in your eye.  Tearing and redness of your eye.  Pus draining from the stye.  How is this diagnosed? Your health care provider may be able to diagnose a stye just by examining your eye. The health care provider may also check to make sure:  You do not have a fever or other signs of a more serious infection.  The infection has not spread to other parts of your eye or areas around your eye.  How is this treated? Most styes will clear up in a few days without treatment. In some cases, you may need to use antibiotic drops or ointment to prevent infection. Your health care provider may have to drain the stye surgically if your stye is:  Large.  Causing a lot of pain.  Interfering with your vision.  This can be done using a thin blade or a needle. Follow these  instructions at home:  Take medicines only as directed by your health care provider.  Apply a clean, warm compress to your eye for 10 minutes, 4 times a day.  Do not wear contact lenses or eye makeup until your stye has healed.  Do not try to pop or drain the stye. Contact a health care provider if:  You have chills or a fever.  Your stye does not go away after several days.  Your stye affects your vision.  Your eyeball becomes swollen, red, or painful. This information is not intended to replace advice given to you by your health care provider. Make sure you discuss any questions you have with your health care provider. Document Released: 10/17/2004 Document Revised: 09/03/2015 Document Reviewed: 04/23/2013 Elsevier Interactive Patient Education  [employer].

## 2017-07-05 NOTE — Progress Notes (Signed)
Subjective: CC: Eye swollen PCP: Dettinger, Fransisca Kaufmann, MD POE:UMPNTIR Macbeth is a 37 y.o. female presenting to clinic today for:  1. Eye swollen Patient reports that she had onset of right lateral lower lid swelling and discomfort on Wednesday.  She notes he started using warm compresses but the following day which became more painful.  She increased the frequency of warm compresses and this seemed to improve yesterday.  However, when she woke up this morning she had a similar lesion along the medial aspect of the right lower leg.  She describes this as painful.  Denies any conjunctival redness.  No ocular discharge.  No pain with extraocular movement.  No visual disturbance.  Denies frequent styes or chalazion formation.  She is a noncontact lens user but does wear eye make-up.  No recent trauma to the eye.   ROS: Per HPI  Allergies  Allergen Reactions  . Other Anaphylaxis    Reaction to hay (something in [place] pig's cage)  . Lavender Oil Hives   Past Medical History:  Diagnosis Date  . Anemia   . Colitis   . Colitis   . Miscarriage     Current Outpatient Medications:  .  Bioflavonoid Products (VITAMIN C) CHEW, Chew 2-3 tablets by mouth daily., Disp: , Rfl:  .  buPROPion (WELLBUTRIN XL) 150 MG 24 hr tablet, Take 1 tablet (150 mg total) by mouth daily., Disp: 30 tablet, Rfl: 1 .  cetirizine (ZYRTEC) 10 MG tablet, Take 10 mg by mouth daily as needed (seasonal allergies)., Disp: , Rfl:  .  ELDERBERRY PO, Take 1 tablet by mouth daily., Disp: , Rfl:  .  ibuprofen (ADVIL,MOTRIN) 200 MG tablet, Take 600 mg by mouth every 6 (six) hours as needed for moderate pain. , Disp: , Rfl:  .  Melatonin 5 MG TABS, Take by mouth as needed., Disp: , Rfl:  .  Multiple Vitamin (MULTIVITAMIN WITH MINERALS) TABS tablet, Take 1 tablet by mouth daily., Disp: , Rfl:  .  RaNITidine HCl (ZANTAC PO), Take 1 tablet by mouth daily as needed (acid reflux)., Disp: , Rfl:  Social History   Socioeconomic History    . Marital status: Married    Spouse name: Not on file  . Number of children: Not on file  . Years of education: Not on file  . Highest education level: Not on file  Occupational History  . Not on file  Social Needs  . Financial resource strain: Not on file  . Food insecurity:    Worry: Not on file    Inability: Not on file  . Transportation needs:    Medical: Not on file    Non-medical: Not on file  Tobacco Use  . Smoking status: Former Smoker    Types: Cigarettes    Last attempt to quit: 02/15/2011    Years since quitting: 6.3  . Smokeless tobacco: Never Used  Substance and Sexual Activity  . Alcohol use: Yes    Alcohol/week: 0.6 oz    Types: 1 Cans of beer per week    Comment: occasional  . Drug use: No  . Sexual activity: Yes    Birth control/protection: None    Comment: married 6 years, trying to get pregnant  Lifestyle  . Physical activity:    Days per week: Not on file    Minutes per session: Not on file  . Stress: Not on file  Relationships  . Social connections:    Talks on phone: Not on file  Gets together: Not on file    Attends religious service: Not on file    Active member of club or organization: Not on file    Attends meetings of clubs or organizations: Not on file    Relationship status: Not on file  . Intimate partner violence:    Fear of current or ex partner: Not on file    Emotionally abused: Not on file    Physically abused: Not on file    Forced sexual activity: Not on file  Other Topics Concern  . Not on file  Social History Narrative  . Not on file   Family History  Problem Relation Age of Onset  . Diabetes Mother   . Hypertension Mother   . Hyperlipidemia Mother   . Diabetes Maternal Grandmother   . Heart disease Maternal Grandmother   . Cancer Maternal Grandmother        sarcoma in arm  . Kidney disease Maternal Grandmother   . Heart disease Maternal Grandfather        congestive heart failure  . Kidney disease Maternal  Grandfather   . Cancer Maternal Grandfather        bladder  . Hypertension Maternal Grandfather   . Heart disease Paternal Grandmother   . Heart disease Paternal Grandfather   . COPD Paternal Grandfather   . Mental illness Paternal Grandfather        PTSD    Objective: Office vital signs reviewed. There were no vitals taken for this visit.  Physical Examination:  General: Awake, alert, well nourished, nontoxic, No acute distress HEENT: Normal    Neck: No masses palpated. No lymphadenopathy    Eyes: PERRLA, extraocular membranes intact, no pain with extraocular movement.  Sclera white; no ocular discharge.  She has 2 small, tender, mildly erythematous swellings along the medial aspect of the lower right lid and the lateral aspect of the lower right lid.  No appreciable discharge.  No tenderness to palpation to the supra or infraorbital spaces.  Visual acuity: L 20/20; R 20/20; B 20/20  Assessment/ Plan: 37 y.o. female   1. Hordeolum internum of right lower eyelid Given associated discomfort more likely to be an internal hordeolum than chalazion.  Her physical exam was remarkable for 2 small tender mildly erythematous soft tissue swelling along the lower lid.  There is no appreciable purulent discharge or involvement of the sclera.  Her neurologic exam was unremarkable, with normal visual acuity.  I have prescribed her erythromycin ointment to apply 3 times a day for the next 7 days.  Instructions for use were demonstrated and discussed.  Home care instructions discussed.  Continue warm compresses frequently.  If symptoms are worsening or she develops any red flag signs, patient to seek immediate medical attention.  She was good understanding will follow-up as needed.    Meds ordered this encounter  Medications  . erythromycin [facility]) ophthalmic ointment    Sig: Place 1 application into the right eye 3 (three) times daily for 7 days.    Dispense:  3.5 g    Refill:  0      Laekyn Rayos Windell Moulding, DO [place] [phone]

## 2017-07-15 ENCOUNTER — Other Ambulatory Visit: Payer: Self-pay | Admitting: Family Medicine

## 2017-07-15 DIAGNOSIS — F411 Generalized anxiety disorder: Secondary | ICD-10-CM

## 2017-07-16 MED FILL — BUPROPION HCL XL 150 MG TAB: 150 | 30 days supply | Qty: 30 | Fill #0

## 2017-07-31 ENCOUNTER — Encounter: Payer: Self-pay | Admitting: Family Medicine

## 2017-07-31 ENCOUNTER — Ambulatory Visit (INDEPENDENT_AMBULATORY_CARE_PROVIDER_SITE_OTHER): Payer: No Typology Code available for payment source | Admitting: Family Medicine

## 2017-07-31 VITALS — BP 113/73 | HR 80 | Temp 97.6°F | Ht 66.0 in | Wt 267.0 lb

## 2017-07-31 DIAGNOSIS — F411 Generalized anxiety disorder: Secondary | ICD-10-CM

## 2017-07-31 MED ORDER — TRAZODONE HCL 50 MG PO TABS: 25.0000 mg | ORAL_TABLET | Freq: Every evening | ORAL | 3 refills | Status: DC | PRN

## 2017-07-31 MED ORDER — BUPROPION HCL ER (XL) 150 MG PO TB24: 150.0000 mg | ORAL_TABLET | Freq: Every day | ORAL | 3 refills | Status: DC

## 2017-07-31 MED FILL — traZODone HCL 50 MG TABS: 50 | 90 days supply | Qty: 90 | Fill #0

## 2017-07-31 NOTE — Progress Notes (Signed)
BP 113/73   Pulse 80   Temp 97.6 F (36.4 C) (Oral)   Ht 5\' 6"  (1.676 m)   Wt 267 lb (121.1 kg)   BMI 43.09 kg/m    Subjective:    Patient ID: Molly [name], female    DOB: [birth year], 37 y.o.   MRN: [id number]  HPI: Molly [name] is a 37 y.o. female presenting on 07/31/2017 for Anxiety follow up (has noticed Wellbutrin has caused some insomnia but feels as if she feels better)   HPI Anxiety Patient is coming in for anxiety recheck.  She says mood wise she is doing very well and denies any major issues with that.  She is very happy with the Wellbutrin except for it does keep her up a little bit more at night than she used to have issues with.  She denies any suicidal ideations or thoughts of hurting herself.  She says her work is going a lot better and she does not feel as stressed or overwhelmed as she had previously.  She is overall pretty happy  Relevant past medical, surgical, family and social history reviewed and updated as indicated. Interim medical history since our last visit reviewed. Allergies and medications reviewed and updated.  Review of Systems  Constitutional: Negative for chills and fever.  Eyes: Negative for visual disturbance.  Respiratory: Negative for chest tightness and shortness of breath.   Cardiovascular: Negative for chest pain and leg swelling.  Musculoskeletal: Negative for back pain and gait problem.  Skin: Negative for rash.  Neurological: Negative for light-headedness and headaches.  Psychiatric/Behavioral: Positive for sleep disturbance. Negative for agitation, behavioral problems, decreased concentration, dysphoric mood, self-injury and suicidal ideas. The patient is nervous/anxious.   All other systems reviewed and are negative.   Per HPI unless specifically indicated above   Allergies as of 07/31/2017      Reactions   Other Anaphylaxis   Reaction to hay (something in [place] pig's cage)   Lavender Oil Hives      Medication List        Accurate as of 07/31/17 10:39 AM. Always use your most recent med list.          buPROPion 150 MG 24 hr tablet Commonly known as:  WELLBUTRIN XL Take 1 tablet (150 mg total) by mouth daily.   cetirizine 10 MG tablet Commonly known as:  ZYRTEC Take 10 mg by mouth daily as needed (seasonal allergies).   ELDERBERRY PO Take 1 tablet by mouth daily.   ibuprofen 200 MG tablet Commonly known as:  ADVIL,MOTRIN Take 600 mg by mouth every 6 (six) hours as needed for moderate pain.   Melatonin 5 MG Tabs Take by mouth as needed.   multivitamin with minerals Tabs tablet Take 1 tablet by mouth daily.   traZODone 50 MG tablet Commonly known as:  DESYREL Take 0.5-1 tablets (25-50 mg total) by mouth at bedtime as needed for sleep.   Vitamin C Chew Chew 2-3 tablets by mouth daily.   ZANTAC PO Take 1 tablet by mouth daily as needed (acid reflux).          Objective:    BP 113/73   Pulse 80   Temp 97.6 F (36.4 C) (Oral)   Ht 5\' 6"  (1.676 m)   Wt 267 lb (121.1 kg)   BMI 43.09 kg/m   Wt Readings from Last 3 Encounters:  07/31/17 267 lb (121.1 kg)  07/05/17 268 lb 3.2 oz (121.7 kg)  06/17/17 266 lb (120.7  kg)    Physical Exam  Constitutional: She is oriented to person, place, and time. She appears well-developed and well-nourished. No distress.  Eyes: Conjunctivae are normal.  Cardiovascular: Normal rate, regular rhythm, normal heart sounds and intact distal pulses.  No murmur heard. Pulmonary/Chest: Effort normal and breath sounds normal. No respiratory distress. She has no wheezes.  Neurological: She is alert and oriented to person, place, and time. Coordination normal.  Skin: Skin is warm and dry. No rash noted. She is not diaphoretic.  Psychiatric: Her behavior is normal. Her mood appears anxious. She does not exhibit a depressed mood. She expresses no suicidal ideation. She expresses no suicidal plans.  Nursing note and vitals reviewed.       Assessment & Plan:    Problem List Items Addressed This Visit      Other   Generalized anxiety disorder - Primary   Relevant Medications   buPROPion (WELLBUTRIN XL) 150 MG 24 hr tablet   traZODone (DESYREL) 50 MG tablet     Continue current medication, will add trazodone because of sleep help.  Follow up plan: Return in about 6 months (around 01/31/2018), or if symptoms worsen or fail to improve, for Anxiety recheck.  Counseling provided for all of the vaccine components No orders of the defined types were placed in this encounter.   Caryl Pina, MD [facility] 07/31/2017, 10:39 AM

## 2017-08-14 MED FILL — buPROPion HCL ER (XL) 150 M: 150 | 90 days supply | Qty: 90 | Fill #0

## 2017-08-23 ENCOUNTER — Ambulatory Visit (INDEPENDENT_AMBULATORY_CARE_PROVIDER_SITE_OTHER): Payer: No Typology Code available for payment source | Admitting: Family Medicine

## 2017-08-23 ENCOUNTER — Encounter: Payer: Self-pay | Admitting: Family Medicine

## 2017-08-23 VITALS — BP 151/112 | HR 81 | Temp 97.2°F | Ht 66.0 in | Wt 262.0 lb

## 2017-08-23 DIAGNOSIS — J40 Bronchitis, not specified as acute or chronic: Secondary | ICD-10-CM | POA: Diagnosis not present

## 2017-08-23 MED ORDER — ALBUTEROL SULFATE 108 (90 BASE) MCG/ACT IN AEPB: 1.0000 | INHALATION_SPRAY | Freq: Four times a day (QID) | RESPIRATORY_TRACT | 0 refills | Status: DC | PRN

## 2017-08-23 MED ORDER — AZITHROMYCIN 250 MG PO TABS: ORAL_TABLET | ORAL | 0 refills | Status: DC

## 2017-08-23 NOTE — Progress Notes (Signed)
BP (!) 151/112 (BP Location: Right Wrist)   Pulse 81   Temp (!) 97.2 F (36.2 C) (Oral)   Ht 5\' 6"  (1.676 m)   Wt 262 lb (118.8 kg)   SpO2 97%   BMI 42.29 kg/m    Subjective:    Patient ID: Molly [name], female    DOB: [birth year], 37 y.o.   MRN: [id number]  HPI: Molly [name] is a 37 y.o. female presenting on 08/23/2017 for Cough (wheezing )   HPI Cough and congestion and wheezing Patient comes in complaining of cough and congestion is been going on for 1 week but then yesterday she started wheezing along with it.  She had been using Mucinex and Robitussin to try and help but does not feel like they have been helping and then she got worse when she started having the wheezing and increased coughing yesterday.  The cough also became productive for the first time yesterday.  She denies any fevers or chills or shortness of breath.  She said that her son was ill last week before she got this but he is better now but she just cannot seem to clear this.  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  Relevant past medical, surgical, family and social history reviewed and updated as indicated. Interim medical history since our last visit reviewed. Allergies and medications reviewed and updated.  Review of Systems  Constitutional: Negative for chills and fever.  HENT: Positive for congestion, postnasal drip, rhinorrhea, sinus pressure, sneezing and sore throat. Negative for ear discharge and ear pain.   Eyes: Negative for pain, redness and visual disturbance.  Respiratory: Positive for cough and wheezing. Negative for chest tightness and shortness of breath.   Cardiovascular: Negative for chest pain and leg swelling.  Genitourinary: Negative for difficulty urinating and dysuria.  Musculoskeletal: Negative for back pain and gait problem.  Skin: Negative for rash.  Neurological: Negative for light-headedness and headaches.  Psychiatric/Behavioral: Negative for  agitation and behavioral problems.  All other systems reviewed and are negative.   Per HPI unless specifically indicated above   Allergies as of 08/23/2017      Reactions   Other Anaphylaxis   Reaction to hay (something in [place] pig's cage)   Lavender Oil Hives      Medication List        Accurate as of 08/23/17 10:26 AM. Always use your most recent med list.          Albuterol Sulfate 108 (90 Base) MCG/ACT Aepb Commonly known as:  PROAIR RESPICLICK Inhale 1 puff into the lungs every 6 (six) hours as needed.   azithromycin 250 MG tablet Commonly known as:  ZITHROMAX Take 2 the first day and then one each day after.   buPROPion 150 MG 24 hr tablet Commonly known as:  WELLBUTRIN XL Take 1 tablet (150 mg total) by mouth daily.   cetirizine 10 MG tablet Commonly known as:  ZYRTEC Take 10 mg by mouth daily as needed (seasonal allergies).   ELDERBERRY PO Take 1 tablet by mouth daily.   ibuprofen 200 MG tablet Commonly known as:  ADVIL,MOTRIN Take 600 mg by mouth every 6 (six) hours as needed for moderate pain.   Melatonin 5 MG Tabs Take by mouth as needed.   multivitamin with minerals Tabs tablet Take 1 tablet by mouth daily.   traZODone 50 MG tablet Commonly known as:  DESYREL Take 0.5-1 tablets (25-50 mg total) by mouth at  bedtime as needed for sleep.   Vitamin C Chew Chew 2-3 tablets by mouth daily.   ZANTAC PO Take 1 tablet by mouth daily as needed (acid reflux).          Objective:    BP (!) 151/112 (BP Location: Right Wrist)   Pulse 81   Temp (!) 97.2 F (36.2 C) (Oral)   Ht 5\' 6"  (1.676 m)   Wt 262 lb (118.8 kg)   SpO2 97%   BMI 42.29 kg/m   Wt Readings from Last 3 Encounters:  08/23/17 262 lb (118.8 kg)  07/31/17 267 lb (121.1 kg)  07/05/17 268 lb 3.2 oz (121.7 kg)    Physical Exam  Constitutional: She is oriented to person, place, and time. She appears well-developed and well-nourished. No distress.  HENT:  Right Ear: Tympanic  membrane, external ear and ear canal normal.  Left Ear: Tympanic membrane, external ear and ear canal normal.  Nose: Mucosal edema and rhinorrhea present. No epistaxis. Right sinus exhibits no maxillary sinus tenderness and no frontal sinus tenderness. Left sinus exhibits no maxillary sinus tenderness and no frontal sinus tenderness.  Mouth/Throat: Uvula is midline and mucous membranes are normal. Posterior oropharyngeal edema and posterior oropharyngeal erythema present. No oropharyngeal exudate or tonsillar abscesses.  Eyes: Conjunctivae are normal.  Cardiovascular: Normal rate, regular rhythm, normal heart sounds and intact distal pulses.  No murmur heard. Pulmonary/Chest: Effort normal. No respiratory distress. She has no wheezes. She has no rales.  Musculoskeletal: Normal range of motion. She exhibits no edema or tenderness.  Neurological: She is alert and oriented to person, place, and time. Coordination normal.  Skin: Skin is warm and dry. No rash noted. She is not diaphoretic.  Psychiatric: She has a normal mood and affect. Her behavior is normal.  Vitals reviewed.     Assessment & Plan:   Problem List Items Addressed This Visit    None    Visit Diagnoses    Bronchitis    -  Primary   Relevant Medications   azithromycin (ZITHROMAX) 250 MG tablet   Albuterol Sulfate (PROAIR RESPICLICK) 277 (90 Base) MCG/ACT AEPB      Follow up plan: Return if symptoms worsen or fail to improve.  Counseling provided for all of the vaccine components No orders of the defined types were placed in this encounter.   Caryl Pina, MD [facility] 08/23/2017, 10:26 AM

## 2017-08-25 ENCOUNTER — Ambulatory Visit: Payer: No Typology Code available for payment source | Admitting: Nutrition

## 2017-09-02 ENCOUNTER — Encounter: Payer: No Typology Code available for payment source | Attending: Family Medicine | Admitting: Nutrition

## 2017-09-02 ENCOUNTER — Encounter: Payer: Self-pay | Admitting: Nutrition

## 2017-09-02 VITALS — Ht 67.0 in | Wt 264.0 lb

## 2017-09-02 DIAGNOSIS — E669 Obesity, unspecified: Secondary | ICD-10-CM

## 2017-09-02 NOTE — Progress Notes (Signed)
Medical Nutrition Therapy:  Appt start time: 1400 end time:  4287.   Assessment:  Primary concerns today: Overweight. Lives with her husband and son. She and her husband coook and shop. 2-3 times per day. 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 Eats 50% of meals. At home.  Has talked to MD about weight loss medications. Wt has been stables for the last 6 yrs.  Has  Cut out sweet tea and drinking more water. Wants to lose weight and prevent diabetes. Caring for her grandmother and under some stress right now. Stressful eater.  Works weekends. Engaged  to making lifestyle and behavior  changes Current diet is excessive in calories and inconsistent in balanced meals. Not exercising.  Wt Readings from Last 3 Encounters:  09/02/17 264 lb (119.7 kg)  08/23/17 262 lb (118.8 kg)  07/31/17 267 lb (121.1 kg)   Ht Readings from Last 3 Encounters:  09/02/17 5\' 7"  (1.702 m)  08/23/17 5\' 6"  (1.676 m)  07/31/17 5\' 6"  (1.676 m)   Body mass index is 41.35 kg/m. CMP Latest Ref Rng & Units 01/29/2017 05/04/2016 02/02/2016  Glucose 65 - 99 mg/dL 93 109(H) 103(H)  BUN 6 - 20 mg/dL 8 5(L) 6  Creatinine 0.57 - 1.00 mg/dL 0.63 0.67 0.73  Sodium 134 - 144 mmol/L 141 138 138  Potassium 3.5 - 5.2 mmol/L 4.3 4.1 3.8  Chloride 96 - 106 mmol/L 104 106 106  CO2 20 - 29 mmol/L 25 18(L) 21(L)  Calcium 8.7 - 10.2 mg/dL 9.4 9.5 9.3  Total Protein 6.0 - 8.5 g/dL 7.2 7.4 7.8  Total Bilirubin 0.0 - 1.2 mg/dL 0.2 0.7 0.6  Alkaline Phos 39 - 117 IU/L 87 79 76  AST 0 - 40 IU/L 11 19 19   ALT 0 - 32 IU/L 10 16 12(L)   Lipid Panel     Component Value Date/Time   CHOL 131 01/29/2017 1602   TRIG 81 01/29/2017 1602   HDL 41 01/29/2017 1602   CHOLHDL 3.2 01/29/2017 1602   LDLCALC 74 01/29/2017 1602      Preferred Learning Style:   No preference indicated   Learning Readiness:   Ready  Change in progress   MEDICATIONS:   DIETARY INTAKE:  24-hr recall:  B ( AM): skipped Snk ( AM):  L ( PM): chicken nuggets and baked potatoe  and lemonade Snk ( PM):  D ( PM): Chicken,  Rice, asparagus,  water Snk ( PM): chocolate ice cream Beverages: water, sweet tea  Usual physical activity: ADL  Estimated energy needs: 1400  calories 158 g carbohydrates 105 g protein 39 g fat  Progress Towards Goal(s):  In progress.   Nutritional Diagnosis:  NB-1.1 Food and nutrition-related knowledge deficit As related to Obesity.  As evidenced by BMI .    Intervention:  Nutrition and weight loss education provided on My Plate, CHO counting, meal planning, portion sizes, timing of meals, avoiding snacks between meals s, taking medications as prescribed, benefits of exercising 30 minutes per day and prevention of. DM. . Goals 1. Follow MY Plate 2. Eat more at home and plan meals better. 3.  Increase fresh fruits and vegetables 4. Don't skip meals. 5. Lose 1 lb per week   Teaching Method Utilized:  Visual Auditory Hands on  Handouts given during visit include:  The Plate Method   Weight Loss TIps   Barriers to learning/adherence to lifestyle change: none  Demonstrated degree of understanding via:  Teach Back   Monitoring/Evaluation:  Dietary intake, exercise,  meal planning, and body weight in 1 week(s).

## 2017-09-02 NOTE — Patient Instructions (Addendum)
Goals 1. Follow MY Plate 2. Eat more at home and plan meals better. 3.  Increase fresh fruits and vegetables 4. Don't skip meals. 5. Lose 1 lb per week

## 2017-09-03 ENCOUNTER — Encounter: Payer: Self-pay | Admitting: Family Medicine

## 2017-09-09 ENCOUNTER — Encounter: Payer: No Typology Code available for payment source | Attending: Family Medicine | Admitting: Nutrition

## 2017-09-09 ENCOUNTER — Encounter: Payer: Self-pay | Admitting: Nutrition

## 2017-09-09 VITALS — Ht 67.0 in | Wt 264.0 lb

## 2017-09-09 DIAGNOSIS — E669 Obesity, unspecified: Secondary | ICD-10-CM

## 2017-09-09 NOTE — Patient Instructions (Addendum)
Goals 1 Prep meals better 2. Increase more fresh fruits and veggies. 3. Try to get  8 hrs of sleep  nightly. 4 Lose 1-2 lbs per week.

## 2017-09-09 NOTE — Progress Notes (Signed)
  Medical Nutrition Therapy:  Appt start time: 1400 end time:  7846.   Assessment:  Primary concerns today: Second Visit.  Overweight.   Hasn't been able to make changes due to some family issues with her grandmother. No weight loss. Is more mindful about foods now but hasn't been able to grocery stop and prep meals for the week for better food choices. Not exercising yet but working on a plan to incorporate it into her daily schedule.    Wt Readings from Last 3 Encounters:  09/09/17 264 lb (119.7 kg)  09/02/17 264 lb (119.7 kg)  08/23/17 262 lb (118.8 kg)   Ht Readings from Last 3 Encounters:  09/09/17 5\' 7"  (1.702 m)  09/02/17 5\' 7"  (1.702 m)  08/23/17 5\' 6"  (1.676 m)   Body mass index is 41.35 kg/m. CMP Latest Ref Rng & Units 01/29/2017 05/04/2016 02/02/2016  Glucose 65 - 99 mg/dL 93 109(H) 103(H)  BUN 6 - 20 mg/dL 8 5(L) 6  Creatinine 0.57 - 1.00 mg/dL 0.63 0.67 0.73  Sodium 134 - 144 mmol/L 141 138 138  Potassium 3.5 - 5.2 mmol/L 4.3 4.1 3.8  Chloride 96 - 106 mmol/L 104 106 106  CO2 20 - 29 mmol/L 25 18(L) 21(L)  Calcium 8.7 - 10.2 mg/dL 9.4 9.5 9.3  Total Protein 6.0 - 8.5 g/dL 7.2 7.4 7.8  Total Bilirubin 0.0 - 1.2 mg/dL 0.2 0.7 0.6  Alkaline Phos 39 - 117 IU/L 87 79 76  AST 0 - 40 IU/L 11 19 19   ALT 0 - 32 IU/L 10 16 12(L)   Lipid Panel     Component Value Date/Time   CHOL 131 01/29/2017 1602   TRIG 81 01/29/2017 1602   HDL 41 01/29/2017 1602   CHOLHDL 3.2 01/29/2017 1602   LDLCALC 74 01/29/2017 1602      Preferred Learning Style:   No preference indicated   Learning Readiness:   Ready  Change in progress   MEDICATIONS:   DIETARY INTAKE:  24-hr recall:  B ( AM): Yogurt or eggs and toast Snk ( AM):  L ( PM):meat and some veggies or eating out. Snk ( PM):  D ( PM): Chicken,  Rice, asparagus,  water Snk ( PM): Beverages: water, sweet tea  Usual physical activity: ADL  Estimated energy needs: 1400  calories 158 g carbohydrates 105 g  protein 39 g fat  Progress Towards Goal(s):  In progress.   Nutritional Diagnosis:  NB-1.1 Food and nutrition-related knowledge deficit As related to Obesity.  As evidenced by BMI .    Intervention:  Nutrition and weight loss education provided on My Plate, CHO counting, meal planning, portion sizes, timing of meals, avoiding snacks between meals s, taking medications as prescribed, benefits of exercising 30 minutes per day and prevention of. DM.Education: Emotional eating, weight loss tips . Goals 1 Prep meals better 2. Increase more fresh fruits and veggies. 3. Try to get  8 hrs of sleep  nightly. 4 Lose 1-2 lbs per week.   Teaching Method Utilized:  Visual Auditory Hands on  Handouts given during visit include:  The Plate Method   Weight Loss TIps   Barriers to learning/adherence to lifestyle change: none  Demonstrated degree of understanding via:  Teach Back   Monitoring/Evaluation:  Dietary intake, exercise, meal planning, and body weight in 1 week(s).

## 2017-09-16 ENCOUNTER — Encounter: Payer: Self-pay | Admitting: Nutrition

## 2017-09-16 ENCOUNTER — Encounter: Payer: No Typology Code available for payment source | Attending: Family Medicine | Admitting: Nutrition

## 2017-09-16 NOTE — Progress Notes (Signed)
  Medical Nutrition Therapy:  Appt start time: 1500 end time:  1515  Assessment:  Primary concerns today: Third Visit.  Overweight.   Hasn't been able to make changes due to some family issues with her grandmother. Planned meals a little better this week but still not on schedule. More mindful. Working on eating slower and more balanced meals. No weight loss. Going grocery shopping soon. Getting better sleep now she feels better rested to start cooking and meal prepping. Wants to keep a food journal for increased awareness. Not exercising yet but working on a plan to incorporate it into her daily schedule. Contempling changes to make fr weight loss and improved health. Wants to deal with emotional eating.  Wt Readings from Last 3 Encounters:  09/16/17 264 lb 12.8 oz (120.1 kg)  09/09/17 264 lb (119.7 kg)  09/02/17 264 lb (119.7 kg)   Ht Readings from Last 3 Encounters:  09/16/17 5\' 7"  (1.702 m)  09/09/17 5\' 7"  (1.702 m)  09/02/17 5\' 7"  (1.702 m)   Body mass index is 41.47 kg/m. CMP Latest Ref Rng & Units 01/29/2017 05/04/2016 02/02/2016  Glucose 65 - 99 mg/dL 93 109(H) 103(H)  BUN 6 - 20 mg/dL 8 5(L) 6  Creatinine 0.57 - 1.00 mg/dL 0.63 0.67 0.73  Sodium 134 - 144 mmol/L 141 138 138  Potassium 3.5 - 5.2 mmol/L 4.3 4.1 3.8  Chloride 96 - 106 mmol/L 104 106 106  CO2 20 - 29 mmol/L 25 18(L) 21(L)  Calcium 8.7 - 10.2 mg/dL 9.4 9.5 9.3  Total Protein 6.0 - 8.5 g/dL 7.2 7.4 7.8  Total Bilirubin 0.0 - 1.2 mg/dL 0.2 0.7 0.6  Alkaline Phos 39 - 117 IU/L 87 79 76  AST 0 - 40 IU/L 11 19 19   ALT 0 - 32 IU/L 10 16 12(L)   Lipid Panel     Component Value Date/Time   CHOL 131 01/29/2017 1602   TRIG 81 01/29/2017 1602   HDL 41 01/29/2017 1602   CHOLHDL 3.2 01/29/2017 1602   LDLCALC 74 01/29/2017 1602      Preferred Learning Style:   No preference indicated   Learning Readiness:   Ready  Change in progress   MEDICATIONS:   DIETARY INTAKE:  24-hr recall:  B ( AM): Yogurt or  eggs and toast Snk ( AM):  L ( PM):meat and some veggies or eating out. Snk ( PM):  D ( PM): Chicken,  Rice, asparagus,  water Snk ( PM): Beverages: water, sweet tea  Usual physical activity: ADL  Estimated energy needs: 1400  calories 158 g carbohydrates 105 g protein 39 g fat  Progress Towards Goal(s):  In progress.   Nutritional Diagnosis:  NB-1.1 Food and nutrition-related knowledge deficit As related to Obesity.  As evidenced by BMI .    Intervention:  Nutrition and weight loss education provided on My Plate, CHO counting, meal planning, portion sizes, timing of meals, avoiding snacks between meals s, taking medications as prescribed, benefits of exercising 30 minutes per day and prevention of. DM.Education: Emotional eating, weight loss tips . Goals 1. Work on [employer] for meals. Lose 2-3 lbs per month.   Teaching Method Utilized:  Visual Auditory Hands on  Handouts given during visit include:  The Plate Method   Weight Loss TIps   Barriers to learning/adherence to lifestyle change: none  Demonstrated degree of understanding via:  Teach Back   Monitoring/Evaluation:  Dietary intake, exercise, meal planning, and body weight in PRN

## 2017-09-16 NOTE — Patient Instructions (Addendum)
Goals 1. Work on [employer] for meals. Lose 2-3 lbs per month.

## 2017-10-15 ENCOUNTER — Encounter: Payer: Self-pay | Admitting: Family

## 2017-10-15 ENCOUNTER — Ambulatory Visit (INDEPENDENT_AMBULATORY_CARE_PROVIDER_SITE_OTHER): Payer: No Typology Code available for payment source | Admitting: Family

## 2017-10-15 VITALS — BP 119/89 | HR 98 | Temp 97.3°F | Ht 67.0 in | Wt 266.6 lb

## 2017-10-15 DIAGNOSIS — J209 Acute bronchitis, unspecified: Secondary | ICD-10-CM

## 2017-10-15 MED ORDER — BENZONATATE 200 MG PO CAPS: 200.0000 mg | ORAL_CAPSULE | Freq: Three times a day (TID) | ORAL | 1 refills | Status: DC | PRN

## 2017-10-15 MED ORDER — HYDROCODONE-HOMATROPINE 5-1.5 MG/5ML PO SYRP: 5.0000 mL | ORAL_SOLUTION | Freq: Three times a day (TID) | ORAL | 0 refills | Status: DC | PRN

## 2017-10-15 MED ORDER — PREDNISONE 10 MG (21) PO TBPK: ORAL_TABLET | ORAL | 0 refills | Status: DC

## 2017-10-15 NOTE — Patient Instructions (Signed)

## 2017-10-15 NOTE — Progress Notes (Signed)
   Subjective:    Patient ID: Molly [name], female    DOB: [birth year], 37 y.o.   MRN: [id number]  Chief Complaint  Patient presents with  . Cough    worse at night    Cough  This is a recurrent problem. The current episode started more than 1 month ago. The problem has been waxing and waning. The problem occurs every few minutes. The cough is non-productive. Associated symptoms include chills, ear congestion, headaches, nasal congestion, postnasal drip, rhinorrhea and shortness of breath. Pertinent negatives include no ear pain, fever, myalgias, sore throat or wheezing. The symptoms are aggravated by pollens. She has tried rest (albuterol inhaler and zpak) for the symptoms. The treatment provided mild relief.      Review of Systems  Constitutional: Positive for chills. Negative for fever.  HENT: Positive for postnasal drip and rhinorrhea. Negative for ear pain and sore throat.   Respiratory: Positive for cough and shortness of breath. Negative for wheezing.   Musculoskeletal: Negative for myalgias.  Neurological: Positive for headaches.  All other systems reviewed and are negative.      Objective:   Physical Exam  Constitutional: She is oriented to person, place, and time. She appears well-developed and well-nourished. No distress.  HENT:  Head: Normocephalic and atraumatic.  Right Ear: External ear normal.  Left Ear: External ear normal. Tympanic membrane is erythematous (mildly).  Nose: Mucosal edema and rhinorrhea present.  Mouth/Throat: Posterior oropharyngeal erythema present.  Eyes: Pupils are equal, round, and reactive to light.  Neck: Normal range of motion. Neck supple. No thyromegaly present.  Cardiovascular: Normal rate, regular rhythm, normal heart sounds and intact distal pulses.  No murmur heard. Pulmonary/Chest: Effort normal and breath sounds normal. No respiratory distress. She has no wheezes.  Intermittent nonproductive cough  Abdominal: Soft. Bowel sounds  are normal. She exhibits no distension. There is no tenderness.  Musculoskeletal: Normal range of motion. She exhibits no edema or tenderness.  Neurological: She is alert and oriented to person, place, and time. She has normal reflexes. No cranial nerve deficit.  Skin: Skin is warm and dry.  Psychiatric: She has a normal mood and affect. Her behavior is normal. Judgment and thought content normal.  Vitals reviewed.    BP 119/89   Pulse 98   Temp (!) 97.3 F (36.3 C) (Oral)   Ht 5\' 7"  (1.702 m)   Wt 266 lb 9.6 oz (120.9 kg)   BMI 41.76 kg/m      Assessment & Plan:  Molly [name] comes in today with chief complaint of Cough (worse at night)   Diagnosis and orders addressed:  1. Acute bronchitis, unspecified organism - Take meds as prescribed - Use a cool mist humidifier  -Use saline nose sprays frequently -Force fluids -For any cough or congestion  Use plain Mucinex- regular strength or max strength is fine -For fever or aces or pains- take tylenol or ibuprofen. -Throat lozenges if help -New toothbrush in 3 days -RTO if symptoms worsen or do not improve  - predniSONE (STERAPRED UNI-PAK 21 TAB) 10 MG (21) TBPK tablet; Use as directed  Dispense: 21 tablet; Refill: 0 - HYDROcodone-homatropine (HYCODAN) 5-1.5 MG/5ML syrup; Take 5 mLs by mouth every 8 (eight) hours as needed for cough.  Dispense: 120 mL; Refill: 0 - benzonatate (TESSALON) 200 MG capsule; Take 1 capsule (200 mg total) by mouth 3 (three) times daily as needed.  Dispense: 30 capsule; Refill: [place], FNP

## 2017-10-29 ENCOUNTER — Encounter: Payer: Self-pay | Admitting: Family Medicine

## 2017-10-30 ENCOUNTER — Other Ambulatory Visit: Payer: Self-pay | Admitting: Family

## 2017-10-30 MED ORDER — DOXYCYCLINE HYCLATE 100 MG PO TABS: 100.0000 mg | ORAL_TABLET | Freq: Two times a day (BID) | ORAL | 0 refills | Status: DC

## 2017-11-07 ENCOUNTER — Encounter: Payer: Self-pay | Admitting: Family Medicine

## 2017-11-07 ENCOUNTER — Encounter: Payer: Self-pay | Admitting: *Deleted

## 2017-11-14 ENCOUNTER — Ambulatory Visit (HOSPITAL_COMMUNITY)
Admission: RE | Admit: 2017-11-14 | Discharge: 2017-11-14 | Disposition: A | Discharge status: Home / Self Care | Payer: No Typology Code available for payment source | Source: Ambulatory Visit | Attending: Nurse Practitioner | Admitting: Nurse Practitioner

## 2017-11-14 ENCOUNTER — Encounter: Payer: Self-pay | Admitting: Family Medicine

## 2017-11-14 ENCOUNTER — Other Ambulatory Visit: Payer: Self-pay | Admitting: Nurse Practitioner

## 2017-11-14 DIAGNOSIS — R059 Cough, unspecified: Secondary | ICD-10-CM

## 2017-11-14 DIAGNOSIS — R05 Cough: Secondary | ICD-10-CM

## 2017-11-14 NOTE — Telephone Encounter (Signed)
MMM, this is a pt of Dettinger. 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 Can you order for her???

## 2017-11-19 ENCOUNTER — Encounter: Payer: Self-pay | Admitting: Family Medicine

## 2017-11-19 ENCOUNTER — Ambulatory Visit (INDEPENDENT_AMBULATORY_CARE_PROVIDER_SITE_OTHER): Payer: No Typology Code available for payment source | Admitting: Family Medicine

## 2017-11-19 VITALS — BP 125/91 | HR 89 | Temp 97.4°F | Ht 67.0 in | Wt 266.4 lb

## 2017-11-19 DIAGNOSIS — J0110 Acute frontal sinusitis, unspecified: Secondary | ICD-10-CM | POA: Diagnosis not present

## 2017-11-19 LAB — VERITOR FLU A/B WAIVED
Influenza A: NEGATIVE
Influenza B: NEGATIVE

## 2017-11-19 MED ORDER — AZITHROMYCIN 250 MG PO TABS: ORAL_TABLET | ORAL | 0 refills | Status: DC

## 2017-11-19 MED FILL — buPROPion HCL ER (XL) 150 M: 150 | 90 days supply | Qty: 90 | Fill #1

## 2017-11-19 NOTE — Progress Notes (Signed)
BP (!) 125/91   Pulse 89   Temp (!) 97.4 F (36.3 C) (Oral)   Ht 5\' 7"  (1.702 m)   Wt 266 lb 6.4 oz (120.8 kg)   SpO2 95%   BMI 41.72 kg/m    Subjective:    Patient ID: Molly [name], female    DOB: 08/17/1980, 37 y.o.   MRN: [id number]  HPI: Molly [name] is a 37 y.o. female presenting on 11/19/2017 for Cough (x 2 days); Nasal Congestion; Headache; Generalized Body Aches; and Chills   HPI Cough and nasal congestion and headache some body aches that is been going on for 2 days.  Patient said she started with a cough but then everything has progressed into the congestion and body aches.  She denies any true fevers or shortness of breath or wheezing.  She is mostly had a lot of sinus congestion and ear pressure and drainage and sore throat and then the body aches today.  Relevant past medical, surgical, family and social history reviewed and updated as indicated. Interim medical history since our last visit reviewed. Allergies and medications reviewed and updated.  Review of Systems  Constitutional: Positive for chills. Negative for fever.  HENT: Positive for congestion, postnasal drip, rhinorrhea, sinus pressure, sneezing and sore throat. Negative for ear discharge and ear pain.   Eyes: Negative for visual disturbance.  Respiratory: Negative for chest tightness and shortness of breath.   Cardiovascular: Negative for chest pain and leg swelling.  Musculoskeletal: Negative for back pain and gait problem.  Skin: Negative for color change and rash.  Neurological: Negative for light-headedness and headaches.  Psychiatric/Behavioral: Negative for agitation and behavioral problems.  All other systems reviewed and are negative.   Per HPI unless specifically indicated above   Allergies as of 11/19/2017      Reactions   Other Anaphylaxis   Reaction to hay (something in [place] pig's cage)   Lavender Oil Hives      Medication List        Accurate as of 11/19/17 11:12 AM. Always  use your most recent med list.          Albuterol Sulfate 108 (90 Base) MCG/ACT Aepb Inhale 1 puff into the lungs every 6 (six) hours as needed.   azithromycin 250 MG tablet Commonly known as:  ZITHROMAX Take 2 the first day and then one each day after.   buPROPion 150 MG 24 hr tablet Commonly known as:  WELLBUTRIN XL Take 1 tablet (150 mg total) by mouth daily.   cetirizine 10 MG tablet Commonly known as:  ZYRTEC Take 10 mg by mouth daily as needed (seasonal allergies).   ELDERBERRY PO Take 1 tablet by mouth daily.   Melatonin 5 MG Tabs Take by mouth as needed.   multivitamin with minerals Tabs tablet Take 1 tablet by mouth daily.   traZODone 50 MG tablet Commonly known as:  DESYREL Take 0.5-1 tablets (25-50 mg total) by mouth at bedtime as needed for sleep.   Vitamin C Chew Chew 2-3 tablets by mouth daily.   ZANTAC PO Take 1 tablet by mouth daily as needed (acid reflux).          Objective:    BP (!) 125/91   Pulse 89   Temp (!) 97.4 F (36.3 C) (Oral)   Ht 5\' 7"  (1.702 m)   Wt 266 lb 6.4 oz (120.8 kg)   SpO2 95%   BMI 41.72 kg/m   Wt Readings from Last 3 Encounters:  11/19/17 266 lb 6.4 oz (120.8 kg)  10/15/17 266 lb 9.6 oz (120.9 kg)  09/16/17 264 lb 12.8 oz (120.1 kg)    Physical Exam  Constitutional: She is oriented to person, place, and time. She appears well-developed and well-nourished. No distress.  HENT:  Right Ear: Tympanic membrane, external ear and ear canal normal.  Left Ear: Tympanic membrane, external ear and ear canal normal.  Nose: Mucosal edema and rhinorrhea present. No epistaxis. Right sinus exhibits frontal sinus tenderness. Right sinus exhibits no maxillary sinus tenderness. Left sinus exhibits frontal sinus tenderness. Left sinus exhibits no maxillary sinus tenderness.  Mouth/Throat: Uvula is midline and mucous membranes are normal. Posterior oropharyngeal edema and posterior oropharyngeal erythema present. No oropharyngeal  exudate or tonsillar abscesses.  Eyes: Conjunctivae are normal.  Cardiovascular: Normal rate, regular rhythm, normal heart sounds and intact distal pulses.  No murmur heard. Pulmonary/Chest: Effort normal and breath sounds normal. No respiratory distress. She has no wheezes. She has no rales.  Musculoskeletal: Normal range of motion. She exhibits no edema or tenderness.  Neurological: She is alert and oriented to person, place, and time. Coordination normal.  Skin: Skin is warm and dry. No rash noted. She is not diaphoretic.  Psychiatric: She has a normal mood and affect. Her behavior is normal.  Vitals reviewed.       Assessment & Plan:   Problem List Items Addressed This Visit    None    Visit Diagnoses    Acute frontal sinusitis, recurrence not specified    -  Primary   Relevant Medications   azithromycin (ZITHROMAX) 250 MG tablet   Other Relevant Orders   Veritor Flu A/B Waived      Patient took Flonase and Mucinex and Tylenol Sinus and cold among other things yesterday to try to improve this but she is coming today because she is still having significant pressure and chills and aches...   Recommended continue the Mucinex and Flonase and nasal saline washes and if things continue to worsen and not improve then go pick up azithromycin Follow up plan: Return if symptoms worsen or fail to improve.  Counseling provided for all of the vaccine components Orders Placed This Encounter  Procedures  . Veritor Flu A/B [place], MD [facility] 11/19/2017, 11:12 AM

## 2017-11-24 ENCOUNTER — Encounter: Payer: Self-pay | Admitting: Family Medicine

## 2017-11-24 DIAGNOSIS — J329 Chronic sinusitis, unspecified: Secondary | ICD-10-CM

## 2017-11-24 MED ORDER — PREDNISONE 20 MG PO TABS: ORAL_TABLET | ORAL | 0 refills | Status: DC

## 2017-12-31 ENCOUNTER — Encounter: Payer: Self-pay | Admitting: Allergy & Immunology

## 2017-12-31 ENCOUNTER — Ambulatory Visit: Payer: No Typology Code available for payment source | Admitting: Allergy & Immunology

## 2017-12-31 VITALS — BP 122/80 | HR 91 | Temp 98.0°F | Resp 20 | Ht 66.1 in | Wt 266.4 lb

## 2017-12-31 DIAGNOSIS — J301 Allergic rhinitis due to pollen: Secondary | ICD-10-CM | POA: Diagnosis not present

## 2017-12-31 DIAGNOSIS — R05 Cough: Secondary | ICD-10-CM | POA: Diagnosis not present

## 2017-12-31 DIAGNOSIS — R059 Cough, unspecified: Secondary | ICD-10-CM

## 2017-12-31 DIAGNOSIS — J4540 Moderate persistent asthma, uncomplicated: Secondary | ICD-10-CM | POA: Diagnosis not present

## 2017-12-31 HISTORY — DX: Moderate persistent asthma, uncomplicated: J45.40

## 2017-12-31 MED ORDER — CARBINOXAMINE MALEATE 6 MG PO TABS: 6.0000 mg | ORAL_TABLET | Freq: Four times a day (QID) | ORAL | 5 refills | Status: DC | PRN

## 2017-12-31 MED ORDER — BUDESONIDE-FORMOTEROL FUMARATE 160-4.5 MCG/ACT IN AERO: 2.0000 | INHALATION_SPRAY | Freq: Two times a day (BID) | RESPIRATORY_TRACT | 5 refills | Status: DC

## 2017-12-31 MED ORDER — AZELASTINE-FLUTICASONE 137-50 MCG/ACT NA SUSP: 2.0000 | NASAL | 5 refills | Status: DC

## 2017-12-31 NOTE — Patient Instructions (Addendum)
1. Moderate persistent asthma, uncomplicated - Lung testing looked normal, but it did improve slightly with the nebulizer treatment. - This is suggestive of asthma, but not definite for it.  - I would like to put you on a somewhat prolonged prednisone burst to see if this can help: two tablets twice daily for four days, one tablet twice daily for four days, one tablet daily for four days, THEN STOP - We are also going to put you on Symbicort two puffs twice daily to see if this helps. - Spacer sample and demonstration provided. - Daily controller medication(s): Symbicort 160/4.14mcg two puffs twice daily with spacer - Prior to physical activity: ProAir 2 puffs 10-15 minutes before physical activity. - Rescue medications: ProAir 4 puffs every 4-6 hours as needed - Asthma control goals:  * Full participation in all desired activities (may need albuterol before activity) * Albuterol use two time or less a week on average (not counting use with activity) * Cough interfering with sleep two time or less a month * Oral steroids no more than once a year * No hospitalizations  2. Chronic rhinitis - Testing today showed: trees and grasses - Copy of test results provided, but it would likely have been more if you had not take cetirizine last night.  - We will get some blood work to confirm this.  - We will call you in 1-2 weeks with the results of the testing.  - Avoidance measures provided. - Start taking: Ryvent (carbinoxamine) 6mg  tablet 3-4 times daily as needed and Dymista (fluticasone/azelastine) two sprays per nostril 1-2 times daily as needed - You can use an extra dose of the antihistamine, if needed, for breakthrough symptoms.  - Consider nasal saline rinses 1-2 times daily to remove allergens from the nasal cavities as well as help with mucous clearance (this is especially helpful to do before the nasal sprays are given) - Consider allergy shots as a means of long-term control. - Allergy  shots "re-train" and "reset" the immune system to ignore environmental allergens and decrease the resulting immune response to those allergens (sneezing, itchy watery eyes, runny nose, nasal congestion, etc).    - Allergy shots improve symptoms in 75-85% of patients.  - We can discuss more at the next appointment if the medications are not working for you.  3. Return in about 4 weeks (around 01/28/2018).   Please inform us of any Emergency Department visits, hospitalizations, or changes in symptoms. Call us before going to the ED for breathing or allergy symptoms since we might be able to fit you in for a sick visit. Feel free to contact us anytime with any questions, problems, or concerns.  It was a pleasure to meet you and your family today!  Websites that have reliable patient information: 1. American Academy of Asthma, Allergy, and Immunology: www.aaaai.org 2. Food Allergy Research and Education (FARE): foodallergy.org 3. Mothers of Asthmatics: http://www.asthmacommunitynetwork.org 4. American College of Allergy, Asthma, and Immunology: MonthlyElectricBill.co.uk   Make sure you are registered to vote! If you have moved or changed any of your contact information, you will need to get this updated before voting!    Reducing Pollen Exposure  The American Academy of Allergy, Asthma and Immunology suggests the following steps to reduce your exposure to pollen during allergy seasons.    1. Do not hang sheets or clothing out to dry; pollen may collect on these items. 2. Do not mow lawns or spend time around freshly cut grass; mowing stirs up  pollen. 3. Keep windows closed at night.  Keep car windows closed while driving. 4. Minimize morning activities outdoors, a time when pollen counts are usually at their highest. 5. Stay indoors as much as possible when pollen counts or humidity is high and on windy days when pollen tends to remain in the air longer. 6. Use air conditioning when possible.  Many air  conditioners have filters that trap the pollen spores. 7. Use a HEPA room air filter to remove pollen form the indoor air you breathe.

## 2017-12-31 NOTE — Progress Notes (Signed)
NEW PATIENT  Date of Service/Encounter:  12/31/17  Referring provider: Dettinger, Fransisca Kaufmann, MD   Assessment:   Moderate persistent asthma, uncomplicated  Allergic rhinitis due to pollen  Cough   [name] is a delightful 37 y.o. female presenting for an evaluation of a cough for nearly five months. Spirometry is normal, but there is some improvement with a nebulizer treatment. She did have a couple of sensitizations on her skin testing, that is despite the use of antihistamines within 24 hours of testing (which we found out about after the testing was placed). In any case, we are going to get some labs to look for environmental allergies. We are also going to aggressively treat the her postnasal drip and start a combined ICS/LABA. We will see her back in four weeks to see how she is doing.    Plan/Recommendations:   21. Moderate persistent asthma, uncomplicated - Lung testing looked normal, but it did improve slightly with the nebulizer treatment. - This is suggestive of asthma, but not definite for it.  - I would like to put you on a somewhat prolonged prednisone burst to see if this can help: two tablets twice daily for four days, one tablet twice daily for four days, one tablet daily for four days, THEN STOP - We are also going to put you on Symbicort two puffs twice daily to see if this helps. - Spacer sample and demonstration provided. - Daily controller medication(s): Symbicort 160/4.65mcg two puffs twice daily with spacer - Prior to physical activity: ProAir 2 puffs 10-15 minutes before physical activity. - Rescue medications: ProAir 4 puffs every 4-6 hours as needed - Asthma control goals:  * Full participation in all desired activities (may need albuterol before activity) * Albuterol use two time or less a week on average (not counting use with activity) * Cough interfering with sleep two time or less a month * Oral steroids no more than once a year * No  hospitalizations  2. Chronic rhinitis - Testing today showed: trees and grasses - Copy of test results provided, but it would likely have been more if you had not take cetirizine last night.  - We will get some blood work to confirm this.  - We will call you in 1-2 weeks with the results of the testing.  - Avoidance measures provided. - Start taking: Ryvent (carbinoxamine) 6mg  tablet 3-4 times daily as needed and Dymista (fluticasone/azelastine) two sprays per nostril 1-2 times daily as needed - You can use an extra dose of the antihistamine, if needed, for breakthrough symptoms.  - Consider nasal saline rinses 1-2 times daily to remove allergens from the nasal cavities as well as help with mucous clearance (this is especially helpful to do before the nasal sprays are given) - Consider allergy shots as a means of long-term control. - Allergy shots "re-train" and "reset" the immune system to ignore environmental allergens and decrease the resulting immune response to those allergens (sneezing, itchy watery eyes, runny nose, nasal congestion, etc).    - Allergy shots improve symptoms in 75-85% of patients.  - We can discuss more at the next appointment if the medications are not working for you.  3. Return in about 4 weeks (around 01/28/2018).  Subjective:   Molly [name] is a 37 y.o. female presenting today for evaluation of  Chief Complaint  Patient presents with  . Cough    Since end of July bronchitis, sinus infections     Molly [name] has  a history of the following: Patient Active Problem List   Diagnosis Date Noted  . Moderate persistent asthma, uncomplicated 91/63/8466  . Non-seasonal allergic rhinitis due to pollen 12/31/2017  . Generalized anxiety disorder 06/17/2017    History obtained from: chart review and patient.  Molly [name] was referred by Dettinger, Fransisca Kaufmann, MD.     Molly [name] is a 37 y.o. female presenting for an evaluation of a cough. She started having problems in  July with intense coughing. She was diagnosed with bronchitis. She was given azithromycin which she completed. Then the cough persisted. She took a round of prednisone and then it continued to "Bounce" between the chest and the head. Then it would consolidate in her chest. She did have a normal CXR in October 2019.   She has in total done two rounds of antibiotics and one round of steroids. The steroids did help but she thinks that it was a Band-Aid. She did get a ProAir RespiClick which she does not think is working very well at all. Her cough is non productive at this point and she gets "into fits". She has heard wheezing and expiratory stridor (she has a stethoscope).   Of note, she did get on Wellbutrin in May 2019. She has a grandmother who was recently diagnosed with metastatic sarcoma. This has been very stressful for her. There is one new exposure including a rabbit which the family adopted around Easter 2019.   She did have allergies as a child and was on medications. Otherwise, there is no history of other atopic diseases, including food allergies, drug allergies, stinging insect allergies, eczema or urticaria. There is no significant infectious history. Vaccinations are up to date.    Past Medical History: Patient Active Problem List   Diagnosis Date Noted  . Moderate persistent asthma, uncomplicated 59/93/5701  . Non-seasonal allergic rhinitis due to pollen 12/31/2017  . Generalized anxiety disorder 06/17/2017    Medication List:  Allergies as of 12/31/2017      Reactions   Other Anaphylaxis   Reaction to hay (something in [place] pig's cage)   Lavender Oil Hives      Medication List        Accurate as of 12/31/17  8:42 PM. Always use your most recent med list.          Albuterol Sulfate 108 (90 Base) MCG/ACT Aepb Inhale 1 puff into the lungs every 6 (six) hours as needed.   Azelastine-Fluticasone 137-50 MCG/ACT Susp Place 2 sprays into both nostrils 1 day or 1 dose.    budesonide-formoterol 160-4.5 MCG/ACT inhaler Commonly known as:  SYMBICORT Inhale 2 puffs into the lungs 2 (two) times daily.   buPROPion 150 MG 24 hr tablet Commonly known as:  WELLBUTRIN XL Take 1 tablet (150 mg total) by mouth daily.   Carbinoxamine Maleate 6 MG Tabs Take 6 mg by mouth 4 (four) times daily as needed (3-4 times daily PRN).   cetirizine 10 MG tablet Commonly known as:  ZYRTEC Take 10 mg by mouth daily as needed (seasonal allergies).   ELDERBERRY PO Take 1 tablet by mouth daily.   Melatonin 5 MG Tabs Take by mouth as needed.   multivitamin with minerals Tabs tablet Take 1 tablet by mouth daily.   traZODone 50 MG tablet Commonly known as:  DESYREL Take 0.5-1 tablets (25-50 mg total) by mouth at bedtime as needed for sleep.   Vitamin C Chew Chew 2-3 tablets by mouth daily.   ZANTAC PO Take 1  tablet by mouth daily as needed (acid reflux).       Birth History: non-contributory  Developmental History: non-contributory.   Past Surgical History: Past Surgical History:  Procedure Laterality Date  . CESAREAN SECTION       Family History: Family History  Problem Relation Age of Onset  . Diabetes Mother   . Hypertension Mother   . Hyperlipidemia Mother   . Allergic rhinitis Mother   . Diabetes Maternal Grandmother   . Heart disease Maternal Grandmother   . Cancer Maternal Grandmother        sarcoma in arm  . Kidney disease Maternal Grandmother   . Allergic rhinitis Maternal Grandmother   . Heart disease Maternal Grandfather        congestive heart failure  . Kidney disease Maternal Grandfather   . Cancer Maternal Grandfather        bladder  . Hypertension Maternal Grandfather   . Heart disease Paternal Grandmother   . Heart disease Paternal Grandfather   . COPD Paternal Grandfather   . Mental illness Paternal Grandfather        PTSD  . Asthma Neg Hx   . Eczema Neg Hx   . Urticaria Neg Hx      Social History: Maria lives at home  with her family. She lives in a house that is 36 years old. There is hardwood throughout the home. They have electric heating and central cooling. There are dogs, cats, and rabbits in the home. There are dust mite coverings on the bedding and dust mite covers on the pillows. There are no tobacco exposure in the home. 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      Review of Systems: a 14-point review of systems is pertinent for what is mentioned in HPI.  Otherwise, all other systems were negative. Constitutional: negative other than that listed in the HPI Eyes: negative other than that listed in the HPI Ears, nose, mouth, throat, and face: negative other than that listed in the HPI Respiratory: negative other than that listed in the HPI Cardiovascular: negative other than that listed in the HPI Gastrointestinal: negative other than that listed in the HPI Genitourinary: negative other than that listed in the HPI Integument: negative other than that listed in the HPI Hematologic: negative other than that listed in the HPI Musculoskeletal: negative other than that listed in the HPI Neurological: negative other than that listed in the HPI Allergy/Immunologic: negative other than that listed in the HPI    Objective:   Blood pressure 122/80, pulse 91, temperature 98 F (36.7 C), temperature source Oral, resp. rate 20, height 5' 6.1" (1.679 m), weight 266 lb 6.4 oz (120.8 kg), SpO2 98 %. Body mass index is 42.87 kg/m.   Physical Exam:  General: Alert, interactive, in no acute distress. Bubbly and pleasant.  Eyes: No conjunctival injection bilaterally, no discharge on the right, no discharge on the left and no Horner-Trantas dots present. PERRL bilaterally. EOMI without pain. No photophobia.  Ears: Right TM pearly gray with normal light reflex, Left TM pearly gray with normal light reflex, Right TM intact without perforation  and Left TM intact without perforation.  Nose/Throat: External nose within normal limits and septum midline. Turbinates edematous and pale with clear discharge. Posterior oropharynx erythematous without cobblestoning in the posterior oropharynx. Tonsils 2+ without exudates.  Tongue without thrush. Neck: Supple without thyromegaly. Trachea midline. Adenopathy: no enlarged lymph  nodes appreciated in the anterior cervical, occipital, axillary, epitrochlear, inguinal, or popliteal regions. Lungs: Clear to auscultation without wheezing, rhonchi or rales. No increased work of breathing. CV: Normal S1/S2. No murmurs. Capillary refill <2 seconds.  Abdomen: Nondistended, nontender. No guarding or rebound tenderness. Bowel sounds present in all fields and hyperactive  Skin: Warm and dry, without lesions or rashes. Extremities:  No clubbing, cyanosis or edema. Neuro:   Grossly intact. No focal deficits appreciated. Responsive to questions.  Diagnostic studies:   Spirometry: results normal (FEV1: 2.90/98%, FVC: 3.92/104%, FEV1/FVC: 74%).    Spirometry consistent with normal pattern. Albuterol nebulizer treatment given in clinic with improvement in FEV1, but not significant per ATS criteria. There was a 100% improvement in the FEF 25-75%. There was air trapping with the FVC but it normalized with the nebulizer treatment.   Allergy Studies:   Airborne Adult Perc - 03-Jan-2018 1703    Time Antigen Placed  1600    Allergen Manufacturer  Lavella Hammock    Location  Back    Number of Test  60    1. Control-Buffer 50% Glycerol  Negative    2. Control-Histamine 1 mg/ml  2+    3. Albumin saline  Negative    4. [place]  Negative    5. [place]  Negative    6. [place]  Negative    7. [place] Blue  Negative    8. Meadow Fescue  Negative    9. Perennial Rye  Negative    10. Sweet Vernal  Negative    11. Timothy  Negative    12. Cocklebur  Negative    13. Burweed Marshelder  Negative    14. Ragweed, short  2+    15.  Ragweed, Giant  Negative    16. Plantain,  English  Negative    17. Lamb's Quarters  Negative    18. Sheep Sorrell  Negative    19. Rough Pigweed  Negative    20. Marsh Elder, Rough  Negative    21. Mugwort, Common  Negative    22. Ash mix  Negative    23. Birch mix  Negative    24. Beech American  Negative    25. Box, Elder  Negative    26. Cedar, red  Negative    27. Cottonwood, [place]  Negative    28. Elm mix  Negative    29. Hickory mix  2+    30. Maple mix  Negative    31. Oak, [place] mix  Negative    32. Pecan Pollen  2+    33. Pine mix  Negative    34. Sycamore Eastern  Negative    35. [place], Black Pollen  Negative    36. Alternaria alternata  Negative    37. Cladosporium Herbarum  Negative    38. Aspergillus mix  Negative    39. Penicillium mix  Negative    40. Bipolaris sorokiniana (Helminthosporium)  Negative    41. Drechslera spicifera (Curvularia)  Negative    42. Mucor plumbeus  Negative    43. Fusarium moniliforme  Negative    44. Aureobasidium pullulans (pullulara)  Negative    45. Rhizopus oryzae  Negative    46. Botrytis cinera  Negative    47. Epicoccum nigrum  Negative    48. Phoma betae  Negative    49. Candida Albicans  Negative    50. Trichophyton mentagrophytes  Negative    51. Mite, D Farinae  5,000 AU/ml  Negative  52. Mite, D Pteronyssinus  5,000 AU/ml  Negative    53. Cat Hair 10,000 BAU/ml  Negative    54.  Dog Epithelia  Negative    55. Mixed Feathers  Negative    56. Horse Epithelia  Negative    57. Cockroach, German  Negative    58. Mouse  Negative    59. Tobacco Leaf  Negative    Other  Negative   Rabbitt       Allergy testing results were read and interpreted by myself, documented by clinical staff.       Salvatore Marvel, MD [facility]

## 2018-01-01 NOTE — Addendum Note (Signed)
Addended by: Lytle Michaels A on: 01/01/2018 08:07 AM   Modules accepted: Orders

## 2018-01-02 ENCOUNTER — Telehealth: Payer: Self-pay

## 2018-01-02 MED ORDER — FLUTICASONE PROPIONATE 50 MCG/ACT NA SUSP: 2.0000 | Freq: Every day | NASAL | 3 refills | Status: DC

## 2018-01-02 MED ORDER — AZELASTINE HCL 0.1 % NA SOLN: 2.0000 | Freq: Every day | NASAL | 3 refills | Status: DC

## 2018-01-02 NOTE — Telephone Encounter (Signed)
Spoke with patient and made her aware that Dymista was not covered under insurance and that we would cal lin Astelin nasal spray and flonase nasal spray to make up Dymista.  Patient voiced understanding.

## 2018-01-04 LAB — IGE+ALLERGENS ZONE 2(30)
Alternaria Alternata IgE: <0.1 kU/L
Amer Sycamore IgE Qn: <0.1 kU/L
Aspergillus Fumigatus IgE: <0.1 kU/L
Bahia Grass IgE: <0.1 kU/L
Bermuda Grass IgE: <0.1 kU/L
Cat Dander IgE: <0.1 kU/L
Cedar, Mountain IgE: <0.1 kU/L
Cladosporium Herbarum IgE: <0.1 kU/L
Cockroach, American IgE: 0.13 kU/L — AB
Common Silver Birch IgE: <0.1 kU/L
D Farinae IgE: 2.5 kU/L — AB
D Pteronyssinus IgE: 2.93 kU/L — AB
Dog Dander IgE: 0.59 kU/L — AB
Hickory, White IgE: 1.45 kU/L — AB
IgE (Immunoglobulin E), Serum: 52 IU/mL (ref 6–495)
Johnson Grass IgE: <0.1 kU/L
Maple/Box Elder IgE: <0.1 kU/L
Mucor Racemosus IgE: <0.1 kU/L
Mugwort IgE Qn: <0.1 kU/L
Nettle IgE: <0.1 kU/L
Oak, White IgE: <0.1 kU/L
Penicillium Chrysogen IgE: <0.1 kU/L
Ragweed, Short IgE: 0.88 kU/L — AB
Sheep Sorrel IgE Qn: <0.1 kU/L
Stemphylium Herbarum IgE: <0.1 kU/L
Sweet gum IgE RAST Ql: <0.1 kU/L
White Mulberry IgE: <0.1 kU/L

## 2018-01-04 LAB — E082-IGE RABBIT EPITHELIA: Rabbit Epithelia IgE: 0.16 kU/L — AB

## 2018-01-12 ENCOUNTER — Encounter: Payer: Self-pay | Admitting: Family Medicine

## 2018-01-12 ENCOUNTER — Ambulatory Visit (INDEPENDENT_AMBULATORY_CARE_PROVIDER_SITE_OTHER): Payer: No Typology Code available for payment source | Admitting: Family Medicine

## 2018-01-12 VITALS — BP 127/84 | HR 102 | Temp 97.6°F | Ht 66.0 in | Wt 269.0 lb

## 2018-01-12 DIAGNOSIS — K644 Residual hemorrhoidal skin tags: Secondary | ICD-10-CM

## 2018-01-12 MED ORDER — HYDROCORTISONE ACETATE 25 MG RE SUPP: 25.0000 mg | Freq: Two times a day (BID) | RECTAL | 0 refills | Status: AC

## 2018-01-12 NOTE — Progress Notes (Signed)
Subjective:    Patient ID: Molly [name], female    DOB: [birth year], 37 y.o.   MRN: [id number]  Chief Complaint:  Hemorrhoids (has had sent birth of son but has never had a flare up that lasted this long)   HPI: Molly [name] is a 37 y.o. female presenting on 01/12/2018 for Hemorrhoids (has had sent birth of son but has never had a flare up that lasted this long)   1. External hemorrhoid   Pt presents today with complaints of a hemorrhoid. Pt states she developed the hemorrhoid when she was pregnant. States she went on a long distant trip Wednesday. States the hemorrhoid started bothering her after the trip on Wednesday. States it has been painful and pruritic. States she has been using ice and topical lidocaine with some relief of the pain. States her symptoms are improving slowly, but are still present. Pt denies rectal bleeding or constipation.    Relevant past medical, surgical, family, and social history reviewed and updated as indicated.  Allergies and medications reviewed and updated.   Past Medical History:  Diagnosis Date  . Anemia   . Colitis   . Colitis   . Miscarriage   . Moderate persistent asthma, uncomplicated 11/94/1740  . Recurrent upper respiratory infection (URI)     Past Surgical History:  Procedure Laterality Date  . CESAREAN SECTION      Social History   Socioeconomic History  . Marital status: Married    Spouse name: Not on file  . Number of children: Not on file  . Years of education: Not on file  . Highest education level: Not on file  Occupational History  . Not on file  Social Needs  . Financial resource strain: Not on file  . Food insecurity:    Worry: Not on file    Inability: Not on file  . Transportation needs:    Medical: Not on file    Non-medical: Not on file  Tobacco Use  . Smoking status: Former Smoker    Types: Cigarettes    Last attempt to quit: 02/15/2011    Years since quitting: 6.9  . Smokeless tobacco: Never Used    Substance and Sexual Activity  . Alcohol use: Yes    Alcohol/week: 1.0 standard drinks    Types: 1 Cans of beer per week    Comment: occasional  . Drug use: No  . Sexual activity: Yes    Birth control/protection: None    Comment: married 6 years, trying to get pregnant  Lifestyle  . Physical activity:    Days per week: Not on file    Minutes per session: Not on file  . Stress: Not on file  Relationships  . Social connections:    Talks on phone: Not on file    Gets together: Not on file    Attends religious service: Not on file    Active member of club or organization: Not on file    Attends meetings of clubs or organizations: Not on file    Relationship status: Not on file  . Intimate partner violence:    Fear of current or ex partner: Not on file    Emotionally abused: Not on file    Physically abused: Not on file    Forced sexual activity: Not on file  Other Topics Concern  . Not on file  Social History Narrative  . Not on file    Outpatient Encounter Medications as of 01/12/2018  Medication  Sig  . Albuterol Sulfate (PROAIR RESPICLICK) 778 (90 Base) MCG/ACT AEPB Inhale 1 puff into the lungs every 6 (six) hours as needed.  Marland Kitchen azelastine (ASTELIN) 0.1 % nasal spray Place 2 sprays into both nostrils daily. Use in each nostril as directed  . Azelastine-Fluticasone (DYMISTA) 137-50 MCG/ACT SUSP Place 2 sprays into both nostrils 1 day or 1 dose.  Marland Kitchen Bioflavonoid Products (VITAMIN C) CHEW Chew 2-3 tablets by mouth daily.  . budesonide-formoterol (SYMBICORT) 160-4.5 MCG/ACT inhaler Inhale 2 puffs into the lungs 2 (two) times daily.  Marland Kitchen buPROPion (WELLBUTRIN XL) 150 MG 24 hr tablet Take 1 tablet (150 mg total) by mouth daily.  . Carbinoxamine Maleate (RYVENT) 6 MG TABS Take 6 mg by mouth 4 (four) times daily as needed (3-4 times daily PRN).  Marland Kitchen cetirizine (ZYRTEC) 10 MG tablet Take 10 mg by mouth daily as needed (seasonal allergies).  Marland Kitchen ELDERBERRY PO Take 1 tablet by mouth daily.   . fluticasone (FLONASE) 50 MCG/ACT nasal spray Place 2 sprays into both nostrils daily.  . Melatonin 5 MG TABS Take by mouth as needed.  . Multiple Vitamin (MULTIVITAMIN WITH MINERALS) TABS tablet Take 1 tablet by mouth daily.  . RaNITidine HCl (ZANTAC PO) Take 1 tablet by mouth daily as needed (acid reflux).  . traZODone (DESYREL) 50 MG tablet Take 0.5-1 tablets (25-50 mg total) by mouth at bedtime as needed for sleep.  . hydrocortisone (ANUSOL-HC) 25 MG suppository Place 1 suppository (25 mg total) rectally 2 (two) times daily for 7 days.   No facility-administered encounter medications on file as of 01/12/2018.     Allergies  Allergen Reactions  . Other Anaphylaxis    Reaction to hay (something in [place] pig's cage)  . Lavender Oil Hives    Review of Systems  Constitutional: Negative for chills, fatigue and fever.  Cardiovascular: Negative for chest pain and palpitations.  Gastrointestinal: Positive for rectal pain (hemorrhoid). Negative for abdominal pain, anal bleeding, blood in stool, constipation, diarrhea and vomiting.  Neurological: Negative for weakness.  All other systems reviewed and are negative.       Objective:    BP 127/84   Pulse (!) 102   Temp 97.6 F (36.4 C) (Oral)   Ht 5\' 6"  (1.676 m)   Wt 269 lb (122 kg)   BMI 43.42 kg/m    Wt Readings from Last 3 Encounters:  01/12/18 269 lb (122 kg)  12/31/17 266 lb 6.4 oz (120.8 kg)  11/19/17 266 lb 6.4 oz (120.8 kg)    Physical Exam Vitals signs and nursing note reviewed.  Constitutional:      Appearance: Normal appearance. She is obese.  HENT:     Head: Normocephalic and atraumatic.  Cardiovascular:     Rate and Rhythm: Normal rate and regular rhythm.  Pulmonary:     Effort: Pulmonary effort is normal.     Breath sounds: Normal breath sounds.  Abdominal:     General: There is no distension.     Tenderness: There is no abdominal tenderness.  Genitourinary:    Rectum: Tenderness and external  hemorrhoid (not thrombosed, tender to touch) present. No anal fissure or internal hemorrhoid. Normal anal tone.  Skin:    General: Skin is warm and dry.     Capillary Refill: Capillary refill takes less than 2 seconds.  Neurological:     General: No focal deficit present.     Mental Status: She is alert and oriented to person, place, and time.  Psychiatric:  Mood and Affect: Mood normal.        Behavior: Behavior normal.        Thought Content: Thought content normal.        Judgment: Judgment normal.     Results for orders placed or performed in visit on 12/31/17  Allergens Zone 2  Result Value Ref Range   Class Description Comment    IgE (Immunoglobulin E), Serum 52 6 - 495 IU/mL   D Pteronyssinus IgE 2.93 (A) Class III kU/L   D Farinae IgE 2.50 (A) Class III kU/L   Cat Dander IgE <0.10 Class 0 kU/L   Dog Dander IgE 0.59 (A) Class II kU/L   [place] Grass IgE <0.10 Class 0 kU/L   Timothy Grass IgE <0.10 Class 0 kU/L   [place] Grass IgE <0.10 Class 0 kU/L   [place] Grass IgE <0.10 Class 0 kU/L   Cockroach, American IgE 0.13 (A) Class 0/I kU/L   Penicillium Chrysogen IgE <0.10 Class 0 kU/L   Cladosporium Herbarum IgE <0.10 Class 0 kU/L   Aspergillus Fumigatus IgE <0.10 Class 0 kU/L   Mucor Racemosus IgE <0.10 Class 0 kU/L   Alternaria Alternata IgE <0.10 Class 0 kU/L   Stemphylium Herbarum IgE <0.10 Class 0 kU/L   Common Silver Wendee Copp IgE <0.10 Class 0 kU/L   Oak, White IgE <0.10 Class 0 kU/L   Elm, American IgE <0.10 Class 0 kU/L   Maple/Box Elder IgE <0.10 Class 0 kU/L   Hickory, White IgE 1.45 (A) Class III kU/L   Amer Sycamore IgE Qn <0.10 Class 0 kU/L   White Mulberry IgE <0.10 Class 0 kU/L   Sweet gum IgE RAST Ql <0.10 Class 0 kU/L   Cedar, [place] IgE <0.10 Class 0 kU/L   Ragweed, Short IgE 0.88 (A) Class II kU/L   Mugwort IgE Qn <0.10 Class 0 kU/L   Plantain, English IgE <0.10 Class 0 kU/L   Pigweed, Rough IgE <0.10 Class 0 kU/L   Sheep Sorrel IgE Qn <0.10  Class 0 kU/L   Nettle IgE <0.10 Class 0 kU/L  Rabbit Epithelia IgE  Result Value Ref Range   Rabbit Epithelia IgE 0.16 (A) Class 0/I kU/L       Pertinent labs & imaging results that were available during my care of the patient were reviewed by me and considered in my medical decision making.  Assessment & Plan:  Caitlin was seen today for hemorrhoids.  Diagnoses and all orders for this visit:  External hemorrhoid Increase water and fiber intake to keep stools soft. May add a stool softener if needed. Continue topical lidocaine as needed. Medications as prescribed. Report any new or worsening symptoms.  -     hydrocortisone (ANUSOL-HC) 25 MG suppository; Place 1 suppository (25 mg total) rectally 2 (two) times daily for 7 days.     Continue all other maintenance medications.  Follow up plan: Return if symptoms worsen or fail to improve.  Educational handout given for hemorrhoids  The above assessment and management plan was discussed with the patient. The patient verbalized understanding of and has agreed to the management plan. Patient is aware to call the clinic if symptoms persist or worsen. Patient is aware when to return to the clinic for a follow-up visit. Patient educated on when it is appropriate to go to the emergency department.   Monia Pouch, FNP-C [facility] [phone]

## 2018-01-12 NOTE — Patient Instructions (Signed)
Hemorrhoids Hemorrhoids are swollen veins that may develop:  In the butt (rectum). These are called internal hemorrhoids.  Around the opening of the butt (anus). These are called external hemorrhoids. Hemorrhoids can cause pain, itching, or bleeding. Most of the time, they do not cause serious problems. They usually get better with diet changes, lifestyle changes, and other home treatments. What are the causes? This condition may be caused by:  Having trouble pooping (constipation).  Pushing hard (straining) to poop.  Watery poop (diarrhea).  Pregnancy.  Being very overweight (obese).  Sitting for long periods of time.  Heavy lifting or other activity that causes you to strain.  Anal sex.  Riding a bike for a long period of time. What are the signs or symptoms? Symptoms of this condition include:  Pain.  Itching or soreness in the butt.  Bleeding from the butt.  Leaking poop.  Swelling in the area.  One or more lumps around the opening of your butt. How is this diagnosed? A doctor can often diagnose this condition by looking at the affected area. The doctor may also:  Do an exam that involves feeling the area with a gloved hand (digital rectal exam).  Examine the area inside your butt using a small tube (anoscope).  Order blood tests. This may be done if you have lost a lot of blood.  Have you get a test that involves looking inside the colon using a flexible tube with a camera on the end (sigmoidoscopy or colonoscopy). How is this treated? This condition can usually be treated at home. Your doctor may tell you to change what you eat, make lifestyle changes, or try home treatments. If these do not help, procedures can be done to remove the hemorrhoids or make them smaller. These may involve:  Placing rubber bands at the base of the hemorrhoids to cut off their blood supply.  Injecting medicine into the hemorrhoids to shrink them.  Shining a type of light  energy onto the hemorrhoids to cause them to fall off.  Doing surgery to remove the hemorrhoids or cut off their blood supply. Follow these instructions at home: Eating and drinking   Eat foods that have a lot of fiber in them. These include whole grains, beans, nuts, fruits, and vegetables.  Ask your doctor about taking products that have added fiber (fibersupplements).  Reduce the amount of fat in your diet. You can do this by: ? Eating low-fat dairy products. ? Eating less red meat. ? Avoiding processed foods.  Drink enough fluid to keep your pee (urine) pale yellow. Managing pain and swelling   Take a warm-water bath (sitz bath) for 20 minutes to ease pain. Do this 3-4 times a day. You may do this in a bathtub or using a portable sitz bath that fits over the toilet.  If told, put ice on the painful area. It may be helpful to use ice between your warm baths. ? Put ice in a plastic bag. ? Place a towel between your skin and the bag. ? Leave the ice on for 20 minutes, 2-3 times a day. General instructions  Take over-the-counter and prescription medicines only as told by your doctor. ? Medicated creams and medicines may be used as told.  Exercise often. Ask your doctor how much and what kind of exercise is best for you.  Go to the bathroom when you have the urge to poop. Do not wait.  Avoid pushing too hard when you poop.  Keep your   butt dry and clean. Use wet toilet paper or moist towelettes after pooping.  Do not sit on the toilet for a long time.  Keep all follow-up visits as told by your doctor. This is important. Contact a doctor if you:  Have pain and swelling that do not get better with treatment or medicine.  Have trouble pooping.  Cannot poop.  Have pain or swelling outside the area of the hemorrhoids. Get help right away if you have:  Bleeding that will not stop. Summary  Hemorrhoids are swollen veins in the butt or around the opening of the butt.   They can cause pain, itching, or bleeding.  Eat foods that have a lot of fiber in them. These include whole grains, beans, nuts, fruits, and vegetables.  Take a warm-water bath (sitz bath) for 20 minutes to ease pain. Do this 3-4 times a day. This information is not intended to replace advice given to you by your health care provider. Make sure you discuss any questions you have with your health care provider. Document Released: 10/17/2007 Document Revised: 05/29/2017 Document Reviewed: 05/29/2017 Elsevier Interactive Patient Education  2019 Elsevier Inc.  

## 2018-01-23 ENCOUNTER — Other Ambulatory Visit: Payer: Self-pay | Admitting: *Deleted

## 2018-01-23 MED ORDER — BUDESONIDE-FORMOTEROL FUMARATE 160-4.5 MCG/ACT IN AERO: 2.0000 | INHALATION_SPRAY | Freq: Two times a day (BID) | RESPIRATORY_TRACT | 5 refills | Status: DC

## 2018-01-23 MED FILL — traZODone HCL 50 MG TABS: 50 | 90 days supply | Qty: 90 | Fill #1

## 2018-01-28 ENCOUNTER — Ambulatory Visit: Payer: No Typology Code available for payment source | Admitting: Allergy & Immunology

## 2018-01-28 ENCOUNTER — Encounter: Payer: Self-pay | Admitting: Allergy & Immunology

## 2018-01-28 VITALS — BP 124/70 | HR 76 | Resp 20

## 2018-01-28 DIAGNOSIS — J301 Allergic rhinitis due to pollen: Secondary | ICD-10-CM

## 2018-01-28 DIAGNOSIS — J4540 Moderate persistent asthma, uncomplicated: Secondary | ICD-10-CM | POA: Diagnosis not present

## 2018-01-28 MED FILL — FLUTICASONE PROP 50 MCG SPR: 50 | 30 days supply | Qty: 16 | Fill #0

## 2018-01-28 MED FILL — AZELASTINE HCL 137 MCG SPRY: 0.1 | 50 days supply | Qty: 30 | Fill #0

## 2018-01-28 NOTE — Progress Notes (Signed)
FOLLOW UP  Date of Service/Encounter:  01/28/18   Assessment:   Moderate persistent asthma, uncomplicated  Allergic rhinitis due to pollen  Cough   Molly [name] is doing much better on her current medication regimen.  Her Symbicort seems to be doing the trick, and I think it will do a lot better when she is able to take it 2 puffs twice daily.  Her allergies are controlled as long as she is on all the medications, but she is interested in starting allergen immunotherapy.  She will check with her insurance company regarding coverage.  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  As with all of our send out vials, which she will get her first injection of each vial in our office.  Plan/Recommendations:   1. Moderate persistent asthma, uncomplicated - Lung testing looked excellent today.   - Copay card for Symbicort provided.  - Daily controller medication(s): Symbicort 160/4.19mcg two puffs twice daily with spacer - Prior to physical activity: ProAir 2 puffs 10-15 minutes before physical activity. - Rescue medications: ProAir 4 puffs every 4-6 hours as needed - Asthma control goals:  * Full participation in all desired activities (may need albuterol before activity) * Albuterol use two time or less a week on average (not counting use with activity) * Cough interfering with sleep two time or less a month * Oral steroids no more than once a year * No hospitalizations  2. Chronic rhinitis (dust mite, cockroach, grasses, trees, and ragweed) - Stop the Ryvent since it was not covered and start Xyzal 5mg  daily (samples provided) - Continue taking: Dymista (fluticasone/azelastine) two sprays per nostril 1-2 times daily as needed (we are going to separate these to get it covered) - You can use an extra dose of the antihistamine, if needed, for breakthrough symptoms.  - Consider nasal saline rinses 1-2 times daily to remove allergens  from the nasal cavities as well as help with mucous clearance (this is especially helpful to do before the nasal sprays are given) - Consider allergy shots as a means of long-term control. - Information on allergy shots provided.   3. Return in about 6 months (around 07/29/2018).  Subjective:   Molly [name] is a 38 y.o. female presenting today for follow up of  Chief Complaint  Patient presents with  . Asthma    asthma has improved some. her insurance denied Dymista & Ryvent. Symbicort was was very expensive, she would like to know if there is a cheaper med. since stopping samples she has began to worsen again.     Molly [name] has a history of the following: Patient Active Problem List   Diagnosis Date Noted  . Moderate persistent asthma, uncomplicated 65/68/1275  . Non-seasonal allergic rhinitis due to pollen 12/31/2017  . Generalized anxiety disorder 06/17/2017    History obtained from: chart review and patient.  Molly [name] Primary Care Provider is Dettinger, Fransisca Kaufmann, MD.     Molly [name] is a 38 y.o. female presenting for a follow up visit.  She was last seen in December 2019 as a new patient.  At that time, her lung testing looked normal.  There was some improvement with a nebulizer treatment.  I did put her on a prolonged burst of prednisone to see if this helped her symptoms.  We also started her on Symbicort 160/4.5 mcg 2 puffs twice daily with pro-air as needed.  She did have testing that  was positive to trees and grasses, but since she had taken cetirizine the night before we did get lab work to confirm.  We started her on carbinoxamine 6 mg 3-4 times daily as needed as well as Dymista 2 sprays per nostril up to twice daily as needed.  Labs did show a sensitization to dust mite, cockroach, trees, and ragweed as well as rabbit.  Since the last visit, she has done well. She did complete the prednisone and the samples that we provided. The Ryvent and the Dymista were not covered,  but the Dymista was separated. She has been doing well on this regimen.  Asthma/Respiratory Symptom History: She is using her Symbicort, but she has been rationing it 2 puffs in the morning since her original prescription was $130 per month.  She is willing to pay $130 for a 67-month supply, but not for a month supply.  Apparently, the pharmacy we sent it to - [facility] - did not have access to the co-pay card.  In any case, she feels markedly better with the use of the Symbicort.  Her activities of daily living have been much more manageable.  ACT score is 22, indicating excellent asthma control.  She has not required any prednisone since last visit.  Allergic Rhinitis Symptom History: Overall, her symptoms are controlled as long as she is on all of her medications.  She does want to discuss allergy shots today.  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  She has not needed antibiotics since last visit.  Otherwise, there have been no changes to her past medical history, surgical history, family history, or social history.    Review of Systems: a 14-point review of systems is pertinent for what is mentioned in HPI.  Otherwise, all other systems were negative.  Constitutional: negative other than that listed in the HPI Eyes: negative other than that listed in the HPI Ears, nose, mouth, throat, and face: negative other than that listed in the HPI Respiratory: negative other than that listed in the HPI Cardiovascular: negative other than that listed in the HPI Gastrointestinal: negative other than that listed in the HPI Genitourinary: negative other than that listed in the HPI Integument: negative other than that listed in the HPI Hematologic: negative other than that listed in the HPI Musculoskeletal: negative other than that listed in the HPI Neurological: negative other than that  listed in the HPI Allergy/Immunologic: negative other than that listed in the HPI    Objective:   Blood pressure 124/70, pulse 76, resp. rate 20, SpO2 97 %. There is no height or weight on file to calculate BMI.   Physical Exam:  General: Alert, interactive, in no acute distress. Very pleasant and adorable.  Eyes: No conjunctival injection bilaterally, no discharge on the right, no discharge on the left and no Horner-Trantas dots present. PERRL bilaterally. EOMI without pain. No photophobia.  Ears: Right TM pearly gray with normal light reflex, Left TM pearly gray with normal light reflex, Right TM intact without perforation and Left TM intact without perforation.  Nose/Throat: External nose within normal limits and septum midline. Turbinates minimally edematous without discharge. Posterior oropharynx mildly erythematous without cobblestoning in the posterior oropharynx. Tonsils 2+ without exudates.  Tongue without thrush. Lungs: Clear to auscultation without wheezing, rhonchi or rales. No increased work of breathing. CV: Normal S1/S2. No murmurs.  Capillary refill <2 seconds.  Skin: Warm and dry, without lesions or rashes. Neuro:   Grossly intact. No focal deficits appreciated. Responsive to questions.  Diagnostic studies:   Spirometry: results normal (FEV1: 3.25/110%, FVC: 3.52/94%, FEV1/FVC: 92%).    Spirometry consistent with normal pattern.   Allergy Studies: none       Salvatore Marvel, MD  [facility] [place] [facility] [place]

## 2018-01-28 NOTE — Patient Instructions (Addendum)
1. Moderate persistent asthma, uncomplicated - Lung testing looked excellent today.   - Copay card for Symbicort provided.  - Daily controller medication(s): Symbicort 160/4.52mcg two puffs twice daily with spacer - Prior to physical activity: ProAir 2 puffs 10-15 minutes before physical activity. - Rescue medications: ProAir 4 puffs every 4-6 hours as needed - Asthma control goals:  * Full participation in all desired activities (may need albuterol before activity) * Albuterol use two time or less a week on average (not counting use with activity) * Cough interfering with sleep two time or less a month * Oral steroids no more than once a year * No hospitalizations  2. Chronic rhinitis (dust mite, cockroach, grasses, trees, and ragweed) - Stop the Ryvent since it was not covered and start Xyzal 5mg  daily (samples provided) - Continue taking: Dymista (fluticasone/azelastine) two sprays per nostril 1-2 times daily as needed (we are going to separate these to get it covered) - You can use an extra dose of the antihistamine, if needed, for breakthrough symptoms.  - Consider nasal saline rinses 1-2 times daily to remove allergens from the nasal cavities as well as help with mucous clearance (this is especially helpful to do before the nasal sprays are given) - Consider allergy shots as a means of long-term control. - Information on allergy shots provided.   3. Return in about 6 months (around 07/29/2018).   Please inform us of any Emergency Department visits, hospitalizations, or changes in symptoms. Call us before going to the ED for breathing or allergy symptoms since we might be able to fit you in for a sick visit. Feel free to contact us anytime with any questions, problems, or concerns.  It was a pleasure to see you and your family again today!  Websites that have reliable patient information: 1. American Academy of Asthma, Allergy, and Immunology: www.aaaai.org 2. Food Allergy Research  and Education (FARE): foodallergy.org 3. Mothers of Asthmatics: http://www.asthmacommunitynetwork.org 4. American College of Allergy, Asthma, and Immunology: MonthlyElectricBill.co.uk   Make sure you are registered to vote! If you have moved or changed any of your contact information, you will need to get this updated before voting!    Voter ID laws are going into effect for the General Election in November 2020! Be prepared! Check out [email or web address].[email or web address] for more details.

## 2018-02-02 ENCOUNTER — Encounter: Payer: Self-pay | Admitting: Family Medicine

## 2018-02-02 ENCOUNTER — Ambulatory Visit (INDEPENDENT_AMBULATORY_CARE_PROVIDER_SITE_OTHER): Payer: No Typology Code available for payment source | Admitting: Family Medicine

## 2018-02-02 VITALS — BP 133/90 | HR 84 | Temp 97.2°F | Ht 66.0 in | Wt 274.4 lb

## 2018-02-02 DIAGNOSIS — Z13220 Encounter for screening for lipoid disorders: Secondary | ICD-10-CM | POA: Diagnosis not present

## 2018-02-02 DIAGNOSIS — R0683 Snoring: Secondary | ICD-10-CM | POA: Diagnosis not present

## 2018-02-02 DIAGNOSIS — Z131 Encounter for screening for diabetes mellitus: Secondary | ICD-10-CM | POA: Diagnosis not present

## 2018-02-02 DIAGNOSIS — F411 Generalized anxiety disorder: Secondary | ICD-10-CM | POA: Diagnosis not present

## 2018-02-02 MED ORDER — TRAZODONE HCL 50 MG PO TABS: 25.0000 mg | ORAL_TABLET | Freq: Every evening | ORAL | 3 refills | Status: DC | PRN

## 2018-02-02 MED ORDER — ALBUTEROL SULFATE 108 (90 BASE) MCG/ACT IN AEPB: 1.0000 | INHALATION_SPRAY | Freq: Four times a day (QID) | RESPIRATORY_TRACT | 0 refills | Status: DC | PRN

## 2018-02-02 MED ORDER — LIRAGLUTIDE -WEIGHT MANAGEMENT 18 MG/3ML ~~LOC~~ SOPN: 3.0000 mg | PEN_INJECTOR | Freq: Every day | SUBCUTANEOUS | 2 refills | Status: DC

## 2018-02-02 MED ORDER — BUPROPION HCL ER (XL) 150 MG PO TB24: 150.0000 mg | ORAL_TABLET | Freq: Every day | ORAL | 3 refills | Status: DC

## 2018-02-02 NOTE — Progress Notes (Signed)
BP 133/90   Pulse 84   Temp (!) 97.2 F (36.2 C) (Oral)   Ht _0  (1.676 m)   Wt 274 lb 6.4 oz (124.5 kg)   BMI 44.29 kg/m    Subjective:    Patient ID: Molly [name], female    DOB: 19-Apr-1980, 38 y.o.   MRN: [id number]  HPI: Molly [name] is a 38 y.o. female presenting on 02/02/2018 for Anxiety (6 month follow up )   HPI Anxiety Patient is coming in to discuss anxiety for recheck of anxiety.  She has been taking Wellbutrin 150 mg and feels like it still doing very well for her and she is very happy.  She has had some trying times recently with some loss in the family but she has been doing very well with this and denies it being a major issue.  She also wants a refill for trazodone which she uses occasionally for sleep but not every night and it has been working when she needs it. Depression screen [facility] 2/9 02/02/2018 01/12/2018 11/19/2017 10/15/2017 09/02/2017  Decreased Interest 0 0 0 0 0  Down, Depressed, Hopeless 0 0 0 0 0  PHQ - 2 Score 0 0 0 0 0    Patient has been focusing on her weight and has been having some issues with that and has been concerned with that.  She is trying to do some dietary and lifestyle changes that she is working on.  She will continue to try and make lifestyle modifications to improve her weight and overall health.  Patient does admit that she is been having some issues with snoring.  Her husband has sleep apnea and she is concerned that she might also have some.  She does admit to nonreassuring sleep.  She does have some hypersomnolence during the day but not extensively but would like to be checked for this.  She does complain of some fatigue at times.  Relevant past medical, surgical, family and social history reviewed and updated as indicated. Interim medical history since our last visit reviewed. Allergies and medications reviewed and updated.  Review of Systems  Constitutional: Positive for fatigue. Negative for chills and fever.  Eyes: Negative  for visual disturbance.  Respiratory: Negative for chest tightness and shortness of breath.   Cardiovascular: Negative for chest pain and leg swelling.  Genitourinary: Negative for difficulty urinating and dysuria.  Musculoskeletal: Negative for back pain and gait problem.  Skin: Negative for rash.  Neurological: Negative for dizziness, light-headedness and headaches.  Psychiatric/Behavioral: Positive for sleep disturbance. Negative for agitation, behavioral problems, dysphoric mood, self-injury and suicidal ideas. The patient is nervous/anxious.   All other systems reviewed and are negative.   Per HPI unless specifically indicated above   Allergies as of 02/02/2018      Reactions   Other Anaphylaxis   Reaction to hay (something in [place] pig's cage)   Lavender Oil Hives      Medication List       Accurate as of February 02, 2018 11:59 PM. Always use your most recent med list.        Albuterol Sulfate 108 (90 Base) MCG/ACT Aepb Commonly known as:  PROAIR RESPICLICK Inhale 1 puff into the lungs every 6 (six) hours as needed.   azelastine 0.1 % nasal spray Commonly known as:  ASTELIN Place 2 sprays into both nostrils daily. Use in each nostril as directed   budesonide-formoterol 160-4.5 MCG/ACT inhaler Commonly known as:  SYMBICORT Inhale 2 puffs into  the lungs 2 (two) times daily.   buPROPion 150 MG 24 hr tablet Commonly known as:  WELLBUTRIN XL Take 1 tablet (150 mg total) by mouth daily.   cetirizine 10 MG tablet Commonly known as:  ZYRTEC Take 10 mg by mouth daily as needed (seasonal allergies).   ELDERBERRY PO Take 1 tablet by mouth daily.   esomeprazole 20 MG capsule Commonly known as:  NEXIUM Take 20 mg by mouth daily at 12 noon.   fluticasone 50 MCG/ACT nasal spray Commonly known as:  FLONASE Place 2 sprays into both nostrils daily.   Liraglutide -Weight Management 18 MG/3ML Sopn Commonly known as:  SAXENDA Inject 3 mg into the skin daily at 3 pm for  30 days.   Melatonin 5 MG Tabs Take by mouth as needed.   multivitamin with minerals Tabs tablet Take 1 tablet by mouth daily.   traZODone 50 MG tablet Commonly known as:  DESYREL Take 0.5-1 tablets (25-50 mg total) by mouth at bedtime as needed for sleep.   Vitamin C Chew Chew 2-3 tablets by mouth daily.          Objective:    BP 133/90   Pulse 84   Temp (!) 97.2 F (36.2 C) (Oral)   Ht _0  (1.676 m)   Wt 274 lb 6.4 oz (124.5 kg)   BMI 44.29 kg/m   Wt Readings from Last 3 Encounters:  02/02/18 274 lb 6.4 oz (124.5 kg)  01/12/18 269 lb (122 kg)  12/31/17 266 lb 6.4 oz (120.8 kg)    Physical Exam Vitals signs and nursing note reviewed.  Constitutional:      General: She is not in acute distress.    Appearance: She is well-developed. She is not diaphoretic.  Eyes:     Conjunctiva/sclera: Conjunctivae normal.  Cardiovascular:     Rate and Rhythm: Normal rate and regular rhythm.     Heart sounds: Normal heart sounds. No murmur.  Pulmonary:     Effort: Pulmonary effort is normal. No respiratory distress.     Breath sounds: Normal breath sounds. No wheezing.  Musculoskeletal: Normal range of motion.        General: No tenderness.  Skin:    General: Skin is warm and dry.     Findings: No rash.  Neurological:     Mental Status: She is alert and oriented to person, place, and time.     Coordination: Coordination normal.  Psychiatric:        Mood and Affect: Mood is anxious. Mood is not depressed.        Behavior: Behavior normal.        Thought Content: Thought content does not include suicidal ideation. Thought content does not include suicidal plan.        Assessment & Plan:   Problem List Items Addressed This Visit      Other   Generalized anxiety disorder - Primary   Relevant Medications   buPROPion (WELLBUTRIN XL) 150 MG 24 hr tablet   traZODone (DESYREL) 50 MG tablet   Other Relevant Orders   CBC with Differential/Platelet (Completed)   Morbid  obesity ([place])   Relevant Medications   Liraglutide -Weight Management ([place]) 18 MG/3ML SOPN   Other Relevant Orders   Ambulatory referral to Sleep Studies    Other Visit Diagnoses    Lipid screening       Relevant Orders   Lipid panel (Completed)   Diabetes mellitus screening  Relevant Orders   CBC with Differential/Platelet (Completed)   CMP14+EGFR (Completed)   Snoring       Relevant Orders   Ambulatory referral to Sleep Studies      Continue Wellbutrin and will do sleep study  We will try [place] for obesity if it is covered by her insurance.  Patient was given samples of Victoza.  Will send for sleep study Follow up plan: Return in about 4 weeks (around 03/02/2018), or if symptoms worsen or fail to improve, for Recheck weight.  Counseling provided for all of the vaccine components Orders Placed This Encounter  Procedures  . CBC with Differential/Platelet  . CMP14+EGFR  . Lipid panel  . Ambulatory referral to Sleep Studies    Caryl Pina, MD [facility] 02/08/2018, 9:33 PM

## 2018-02-03 LAB — LIPID PANEL
Chol/HDL Ratio: 2.7 ratio (ref 0.0–4.4)
Cholesterol, Total: 125 mg/dL (ref 100–199)
HDL: 46 mg/dL (ref >39)
LDL Calculated: 60 mg/dL (ref 0–99)
Triglycerides: 94 mg/dL (ref 0–149)
VLDL Cholesterol Cal: 19 mg/dL (ref 5–40)

## 2018-02-03 LAB — CBC WITH DIFFERENTIAL/PLATELET
Basophils Absolute: 0.1 10*3/uL (ref 0.0–0.2)
Basos: 1 %
EOS (ABSOLUTE): 0.4 10*3/uL (ref 0.0–0.4)
Eos: 4 %
Hematocrit: 37.1 % (ref 34.0–46.6)
Hemoglobin: 12.2 g/dL (ref 11.1–15.9)
Immature Grans (Abs): 0 10*3/uL (ref 0.0–0.1)
Immature Granulocytes: 0 %
LYMPHS ABS: 2.4 10*3/uL (ref 0.7–3.1)
Lymphs: 23 %
MCH: 26.4 pg — ABNORMAL LOW (ref 26.6–33.0)
MCHC: 32.9 g/dL (ref 31.5–35.7)
MCV: 80 fL (ref 79–97)
Monocytes Absolute: 0.7 10*3/uL (ref 0.1–0.9)
Monocytes: 7 %
Neutrophils Absolute: 6.7 10*3/uL (ref 1.4–7.0)
Neutrophils: 65 %
Platelets: 409 10*3/uL (ref 150–450)
RBC: 4.62 x10E6/uL (ref 3.77–5.28)
RDW: 13.2 % (ref 11.7–15.4)
WBC: 10.2 10*3/uL (ref 3.4–10.8)

## 2018-02-03 LAB — CMP14+EGFR
ALT: 11 IU/L (ref 0–32)
AST: 14 IU/L (ref 0–40)
Albumin/Globulin Ratio: 1.5 (ref 1.2–2.2)
Albumin: 4.1 g/dL (ref 3.5–5.5)
Alkaline Phosphatase: 87 IU/L (ref 39–117)
BUN/Creatinine Ratio: 11 (ref 9–23)
BUN: 7 mg/dL (ref 6–20)
CHLORIDE: 104 mmol/L (ref 96–106)
CO2: 23 mmol/L (ref 20–29)
Calcium: 9.3 mg/dL (ref 8.7–10.2)
Creatinine, Ser: 0.63 mg/dL (ref 0.57–1.00)
GFR calc Af Amer: 133 mL/min/{1.73_m2} (ref >59)
GFR calc non Af Amer: 116 mL/min/{1.73_m2} (ref >59)
Globulin, Total: 2.8 g/dL (ref 1.5–4.5)
Glucose: 84 mg/dL (ref 65–99)
Potassium: 4.2 mmol/L (ref 3.5–5.2)
Sodium: 140 mmol/L (ref 134–144)
Total Protein: 6.9 g/dL (ref 6.0–8.5)

## 2018-02-13 ENCOUNTER — Ambulatory Visit (INDEPENDENT_AMBULATORY_CARE_PROVIDER_SITE_OTHER): Payer: No Typology Code available for payment source | Admitting: Nurse Practitioner

## 2018-02-13 ENCOUNTER — Encounter: Payer: Self-pay | Admitting: Nurse Practitioner

## 2018-02-13 VITALS — BP 122/86 | HR 87 | Temp 97.0°F | Ht 66.0 in | Wt 267.0 lb

## 2018-02-13 DIAGNOSIS — K2970 Gastritis, unspecified, without bleeding: Secondary | ICD-10-CM

## 2018-02-13 MED ORDER — ONDANSETRON HCL 4 MG PO TABS: 4.0000 mg | ORAL_TABLET | Freq: Three times a day (TID) | ORAL | 0 refills | Status: DC | PRN

## 2018-02-13 NOTE — Patient Instructions (Signed)

## 2018-02-13 NOTE — Progress Notes (Signed)
   Subjective:    Patient ID: Molly [name], female    DOB: 09-05-1980, 38 y.o.   MRN: [id number]   Chief Complaint: Emesis (overnight and today) and Abdominal Pain (Dull and constant)   HPI Patient stated throwing up yesterday evening and started vomiting in the middle of night. Stomach feels achy. Her upper stomach has hurt all day. Just wanted to make sure not something bad.   Review of Systems  Constitutional: Negative.   Respiratory: Negative.   Cardiovascular: Negative.   Gastrointestinal: Positive for abdominal pain, nausea and vomiting. Negative for diarrhea.  Genitourinary: Negative.   Neurological: Negative for headaches.  All other systems reviewed and are negative.      Objective:   Physical Exam Vitals signs and nursing note reviewed.  Constitutional:      General: She is not in acute distress.    Appearance: She is normal weight.  Cardiovascular:     Rate and Rhythm: Normal rate and regular rhythm.     Heart sounds: Normal heart sounds.  Abdominal:     General: Abdomen is flat. Bowel sounds are normal.     Palpations: Abdomen is soft.     Tenderness: There is no abdominal tenderness.  Neurological:     Mental Status: She is alert.    BP 122/86   Pulse 87   Temp (!) 97 F (36.1 C) (Oral)   Ht 5\' 6"  (1.676 m)   Wt 267 lb (121.1 kg)   BMI 43.09 kg/m        Assessment & Plan:  [name] in today with chief complaint of Emesis (overnight and today) and Abdominal Pain (Dull and constant)   1. Viral gastritis First 24 Hours-Clear liquids  popsicles  Jello  gatorade  Sprite Second 24 hours-Add Full liquids ( Liquids you cant see through) Third 24 hours- Bland diet ( foods that are baked or broiled)  *avoiding fried foods and highly spiced foods* During these 3 days  Avoid milk, cheese, ice cream or any other dairy products  Avoid caffeine- REMEMBER Mt. Dew and Mello Yellow contain lots of caffeine You should eat and drink in  Frequent small  volumes If no improvement in symptoms or worsen in 2-3 days should RETRUN TO OFFICE or go to ER!    Meds ordered this encounter  Medications  . ondansetron (ZOFRAN) 4 MG tablet    Sig: Take 1 tablet (4 mg total) by mouth every 8 (eight) hours as needed for nausea or vomiting.    Dispense:  20 tablet    Refill:  0    Order Specific Question:   Supervising Provider    Answer:   Eustaquio Maize [[id number]   Mary-Margaret Hassell Done, FNP

## 2018-02-24 MED FILL — buPROPion HCL ER (XL) 150 M: 150 | 90 days supply | Qty: 90 | Fill #2

## 2018-03-02 ENCOUNTER — Ambulatory Visit (INDEPENDENT_AMBULATORY_CARE_PROVIDER_SITE_OTHER): Payer: No Typology Code available for payment source | Admitting: Family Medicine

## 2018-03-02 ENCOUNTER — Encounter: Payer: Self-pay | Admitting: Family Medicine

## 2018-03-02 VITALS — BP 133/89 | HR 89 | Temp 97.0°F | Ht 66.0 in | Wt 272.4 lb

## 2018-03-02 DIAGNOSIS — F411 Generalized anxiety disorder: Secondary | ICD-10-CM | POA: Diagnosis not present

## 2018-03-02 DIAGNOSIS — I10 Essential (primary) hypertension: Secondary | ICD-10-CM | POA: Insufficient documentation

## 2018-03-02 MED ORDER — LIRAGLUTIDE -WEIGHT MANAGEMENT 18 MG/3ML ~~LOC~~ SOPN: 3.0000 mg | PEN_INJECTOR | Freq: Every day | SUBCUTANEOUS | 2 refills | Status: DC

## 2018-03-02 MED ORDER — OMEPRAZOLE 20 MG PO CPDR: 20.0000 mg | DELAYED_RELEASE_CAPSULE | Freq: Every day | ORAL | 3 refills | Status: DC

## 2018-03-02 MED ORDER — AMLODIPINE BESYLATE 2.5 MG PO TABS: 2.5000 mg | ORAL_TABLET | Freq: Every day | ORAL | 3 refills | Status: DC

## 2018-03-02 MED FILL — AMLODIPINE 2.5 MG TABLET: 2.5 | 90 days supply | Qty: 90 | Fill #0

## 2018-03-02 MED FILL — OMEPRAZOLE 20 MG CPDR: 20 | 90 days supply | Qty: 90 | Fill #0

## 2018-03-02 NOTE — Progress Notes (Signed)
 BP 133/89   Pulse 89   Temp (!) 97 F (36.1 C) (Oral)   Ht 5' 6" (1.676 m)   Wt 272 lb 6.4 oz (123.6 kg)   BMI 43.97 kg/m    Subjective:    Patient ID: Molly [name], female    DOB: 02/26/1980, 37 y.o.   MRN: [id number]  HPI: Molly [name] is a 37 y.o. female presenting on 03/02/2018 for Anxiety (4 week follow up); Depression; and Hypertension   HPI Hypertension Patient is currently on amlodipine, and their blood pressure today is 133/89. Patient denies any lightheadedness or dizziness. Patient denies headaches, blurred vision, chest pains, shortness of breath, or weakness. Denies any side effects from medication and is content with current medication.   Anxiety Patient is coming in for anxiety recheck.  She is currently on Wellbutrin and says that it has been working for the most part well for her including helping her with sleep.  Otherwise she is doing very well.  She denies any suicidal ideations or thoughts of hurting herself. Depression screen PHQ 2/9 03/02/2018 02/13/2018 02/02/2018 01/12/2018 11/19/2017  Decreased Interest 0 0 0 0 0  Down, Depressed, Hopeless 0 0 0 0 0  PHQ - 2 Score 0 0 0 0 0     Obesity and weight loss medication Patient is coming in to discuss obesity and weight loss management, she was using samples for Saxenda and although she has not lost a lot of weight, she does feel like it is helping with her appetite and she is continued to change her diet.  She is out of samples currently.  Relevant past medical, surgical, family and social history reviewed and updated as indicated. Interim medical history since our last visit reviewed. Allergies and medications reviewed and updated.  Review of Systems  Constitutional: Negative for chills and fever.  HENT: Negative for congestion, ear discharge and ear pain.   Eyes: Negative for redness and visual disturbance.  Respiratory: Negative for chest tightness and shortness of breath.   Cardiovascular: Negative for  chest pain and leg swelling.  Musculoskeletal: Negative for back pain and gait problem.  Skin: Negative for rash.  Neurological: Negative for light-headedness and headaches.  Psychiatric/Behavioral: Negative for agitation, behavioral problems, dysphoric mood, self-injury, sleep disturbance and suicidal ideas. The patient is nervous/anxious.   All other systems reviewed and are negative.   Per HPI unless specifically indicated above   Allergies as of 03/02/2018      Reactions   Other Anaphylaxis   Reaction to hay (something in guinea pig's cage)   Lavender Oil Hives      Medication List       Accurate as of March 02, 2018 11:59 PM. Always use your most recent med list.        Albuterol Sulfate 108 (90 Base) MCG/ACT Aepb Commonly known as:  PROAIR RESPICLICK Inhale 1 puff into the lungs every 6 (six) hours as needed.   amLODipine 2.5 MG tablet Commonly known as:  NORVASC Take 1 tablet (2.5 mg total) by mouth daily.   azelastine 0.1 % nasal spray Commonly known as:  ASTELIN Place 2 sprays into both nostrils daily. Use in each nostril as directed   budesonide-formoterol 160-4.5 MCG/ACT inhaler Commonly known as:  SYMBICORT Inhale 2 puffs into the lungs 2 (two) times daily.   buPROPion 150 MG 24 hr tablet Commonly known as:  WELLBUTRIN XL Take 1 tablet (150 mg total) by mouth daily.   cetirizine 10 MG tablet Commonly   known as:  ZYRTEC Take 10 mg by mouth daily as needed (seasonal allergies).   ELDERBERRY PO Take 1 tablet by mouth daily.   fluticasone 50 MCG/ACT nasal spray Commonly known as:  FLONASE Place 2 sprays into both nostrils daily.   Liraglutide -Weight Management 18 MG/3ML Sopn Commonly known as:  SAXENDA Inject 3 mg into the skin daily at 3 pm for 30 days.   Melatonin 5 MG Tabs Take by mouth as needed.   multivitamin with minerals Tabs tablet Take 1 tablet by mouth daily.   omeprazole 20 MG capsule Commonly known as:  PRILOSEC Take 1 capsule  (20 mg total) by mouth daily.   ondansetron 4 MG tablet Commonly known as:  ZOFRAN Take 1 tablet (4 mg total) by mouth every 8 (eight) hours as needed for nausea or vomiting.   traZODone 50 MG tablet Commonly known as:  DESYREL Take 0.5-1 tablets (25-50 mg total) by mouth at bedtime as needed for sleep.   Vitamin C Chew Chew 2-3 tablets by mouth daily.          Objective:    BP 133/89   Pulse 89   Temp (!) 97 F (36.1 C) (Oral)   Ht 5' 6" (1.676 m)   Wt 272 lb 6.4 oz (123.6 kg)   BMI 43.97 kg/m   Wt Readings from Last 3 Encounters:  03/02/18 272 lb 6.4 oz (123.6 kg)  02/13/18 267 lb (121.1 kg)  02/02/18 274 lb 6.4 oz (124.5 kg)    Physical Exam Vitals signs and nursing note reviewed.  Constitutional:      General: She is not in acute distress.    Appearance: She is well-developed. She is not diaphoretic.  Eyes:     Conjunctiva/sclera: Conjunctivae normal.  Cardiovascular:     Rate and Rhythm: Normal rate and regular rhythm.     Heart sounds: Normal heart sounds. No murmur.  Pulmonary:     Effort: Pulmonary effort is normal. No respiratory distress.     Breath sounds: Normal breath sounds. No wheezing.  Musculoskeletal: Normal range of motion.        General: No tenderness.  Skin:    General: Skin is warm and dry.     Findings: No rash.  Neurological:     Mental Status: She is alert and oriented to person, place, and time.     Coordination: Coordination normal.  Psychiatric:        Behavior: Behavior normal.     Results for orders placed or performed in visit on 02/02/18  CBC with Differential/Platelet  Result Value Ref Range   WBC 10.2 3.4 - 10.8 x10E3/uL   RBC 4.62 3.77 - 5.28 x10E6/uL   Hemoglobin 12.2 11.1 - 15.9 g/dL   Hematocrit 37.1 34.0 - 46.6 %   MCV 80 79 - 97 fL   MCH 26.4 (L) 26.6 - 33.0 pg   MCHC 32.9 31.5 - 35.7 g/dL   RDW 13.2 11.7 - 15.4 %   Platelets 409 150 - 450 x10E3/uL   Neutrophils 65 Not Estab. %   Lymphs 23 Not Estab. %    Monocytes 7 Not Estab. %   Eos 4 Not Estab. %   Basos 1 Not Estab. %   Neutrophils Absolute 6.7 1.4 - 7.0 x10E3/uL   Lymphocytes Absolute 2.4 0.7 - 3.1 x10E3/uL   Monocytes Absolute 0.7 0.1 - 0.9 x10E3/uL   EOS (ABSOLUTE) 0.4 0.0 - 0.4 x10E3/uL   Basophils Absolute 0.1 0.0 - 0.2   x10E3/uL   Immature Granulocytes 0 Not Estab. %   Immature Grans (Abs) 0.0 0.0 - 0.1 x10E3/uL  CMP14+EGFR  Result Value Ref Range   Glucose 84 65 - 99 mg/dL   BUN 7 6 - 20 mg/dL   Creatinine, Ser 0.63 0.57 - 1.00 mg/dL   GFR calc non Af Amer 116 >59 mL/min/1.73   GFR calc Af Amer 133 >59 mL/min/1.73   BUN/Creatinine Ratio 11 9 - 23   Sodium 140 134 - 144 mmol/L   Potassium 4.2 3.5 - 5.2 mmol/L   Chloride 104 96 - 106 mmol/L   CO2 23 20 - 29 mmol/L   Calcium 9.3 8.7 - 10.2 mg/dL   Total Protein 6.9 6.0 - 8.5 g/dL   Albumin 4.1 3.5 - 5.5 g/dL   Globulin, Total 2.8 1.5 - 4.5 g/dL   Albumin/Globulin Ratio 1.5 1.2 - 2.2   Bilirubin Total <0.2 0.0 - 1.2 mg/dL   Alkaline Phosphatase 87 39 - 117 IU/L   AST 14 0 - 40 IU/L   ALT 11 0 - 32 IU/L  Lipid panel  Result Value Ref Range   Cholesterol, Total 125 100 - 199 mg/dL   Triglycerides 94 0 - 149 mg/dL   HDL 46 >39 mg/dL   VLDL Cholesterol Cal 19 5 - 40 mg/dL   LDL Calculated 60 0 - 99 mg/dL   Chol/HDL Ratio 2.7 0.0 - 4.4 ratio      Assessment & Plan:   Problem List Items Addressed This Visit      Cardiovascular and Mediastinum   Essential hypertension - Primary   Relevant Medications   amLODipine (NORVASC) 2.5 MG tablet     Other   Generalized anxiety disorder   Morbid obesity (HCC)   Relevant Medications   Liraglutide -Weight Management (SAXENDA) 18 MG/3ML SOPN      We will continue Saxenda and send a prescription for, will do amlodipine. Follow up plan: Return in about 4 weeks (around 03/30/2018), or if symptoms worsen or fail to improve, for htn and wieght.  Counseling provided for all of the vaccine components No orders of the defined  types were placed in this encounter.   Caryl Pina, MD [facility] 03/08/2018, 10:35 AM

## 2018-03-06 ENCOUNTER — Telehealth: Payer: Self-pay

## 2018-03-06 NOTE — Telephone Encounter (Signed)
Insurance denied Saxenda.

## 2018-03-06 NOTE — Telephone Encounter (Signed)
Have patient measure her abdominal or waist circumference and tell us that so I can add it to her chart because I may be able to with that information get her qualified for the [place] and get the insurance to pay for it.

## 2018-03-06 NOTE — Telephone Encounter (Signed)
Patient aware that Saxenda denied.  She will measure her waist and send a mychart message with that information.

## 2018-03-12 MED FILL — SYMBICORT 160-4.5 MCG INH: 160-4.5 | 30 days supply | Qty: 10 | Fill #0

## 2018-03-17 ENCOUNTER — Ambulatory Visit: Payer: No Typology Code available for payment source | Admitting: Neurology

## 2018-03-17 ENCOUNTER — Telehealth: Payer: Self-pay

## 2018-03-17 ENCOUNTER — Encounter: Payer: Self-pay | Admitting: Neurology

## 2018-03-17 VITALS — BP 131/91 | HR 95 | Ht 66.0 in | Wt 268.0 lb

## 2018-03-17 DIAGNOSIS — K219 Gastro-esophageal reflux disease without esophagitis: Secondary | ICD-10-CM

## 2018-03-17 DIAGNOSIS — I10 Essential (primary) hypertension: Secondary | ICD-10-CM | POA: Diagnosis not present

## 2018-03-17 DIAGNOSIS — J301 Allergic rhinitis due to pollen: Secondary | ICD-10-CM

## 2018-03-17 DIAGNOSIS — J4540 Moderate persistent asthma, uncomplicated: Secondary | ICD-10-CM | POA: Diagnosis not present

## 2018-03-17 NOTE — Progress Notes (Signed)
SLEEP MEDICINE CLINIC   Provider:  Larey Seat, MD   Primary Care Physician:  Dettinger, Fransisca Kaufmann, MD   Referring Provider: Dettinger, Fransisca Kaufmann, MD    Chief Complaint  Patient presents with  . New Patient (Initial Visit)    pt with son, rm 64. pt states that she wanted to be tested for possible sleep apnea. pt has been told that she snores in sleep and has worsened. she has had moments where she has had reflux concerns, choking on reflux. her reflux is better controlled. complains of being tired during the day, she works odd/work hrs. pt uses melatonin to help with falling asleep and trazodone prn.     HPI:  Molly [name] is a 38 y.o. female patient, seen on 2-25-20202 in  referral  from Dr. Warrick Parisian for sleep apnea.   Mrs. Schorr works at 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  The patient was diagnosed with asthma, had recurrent bronchitis.    Chief complaint according to patient :" I need a sleep study".   Sleep habits are as follows: He does not always dinnertime is usually around 7 PM, the patient retreats to bed by about 1030 on average.  She will no longer consume red wine for dinner as she had reflux and she keeps as time of at least 2 hours between meal and bedtime. She takes melatonin nightly and her usual average sleep latency is less than 30 minutes, In a cool, quiet and dark bedroom.  She prefers to sleep on her side and on one pillow, with another between the legs.  The bed is not adjusted in hight. Nocturia times one. Can't remember her dreams.  Rises at 9 AM on week days, and working through the weekends , returning at 5 AM and she will rise at 12 noon.   Sleep medical history: morbid obesity- may be hiatal hernia, GERD, asthma.   Family sleep history: mother may have asthma, has OSA, has rhinitis, and sinusitis.    Social history:  Friday, Saturday and Sunday works until 4 AM. No exercise  possibility, caretaker duties consume all time.  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   [employer] radiology tech.  Former smoker quit Jan 25th 2013.  Alcohol - yes, but not red wine or spirits.   Review of Systems: Out of a complete 14 system review, the patient complains of only the following symptoms, and all other reviewed systems are negative.   Epworth score: 4/ 24 , Fatigue severity score: 39/ 63  , depression score; N/A     Social History   Socioeconomic History  . Marital status: Married    Spouse name: Not on file  . Number of children: Not on file  . Years of education: Not on file  . Highest education level: Not on file  Occupational History  . Not on file  Social Needs  . Financial resource strain: Not on file  . Food insecurity:    Worry: Not on file    Inability: Not on file  . Transportation needs:    Medical: Not on file    Non-medical: Not on file  Tobacco Use  . Smoking status: Former Smoker    Types: Cigarettes    Last attempt to quit: 02/15/2011    Years since quitting: 7.0  . Smokeless tobacco: Never Used  Substance and Sexual Activity  . Alcohol use:  Yes    Alcohol/week: 1.0 standard drinks    Types: 1 Cans of beer per week    Comment: occasional  . Drug use: No  . Sexual activity: Yes    Birth control/protection: None    Comment: married 6 years, trying to get pregnant  Lifestyle  . Physical activity:    Days per week: Not on file    Minutes per session: Not on file  . Stress: Not on file  Relationships  . Social connections:    Talks on phone: Not on file    Gets together: Not on file    Attends religious service: Not on file    Active member of club or organization: Not on file    Attends meetings of clubs or organizations: Not on file    Relationship status: Not on file  . Intimate partner violence:    Fear of current or ex partner: Not on file    Emotionally abused: Not on file    Physically  abused: Not on file    Forced sexual activity: Not on file  Other Topics Concern  . Not on file  Social History Narrative  . Not on file    Family History  Problem Relation Age of Onset  . Diabetes Mother   . Hypertension Mother   . Hyperlipidemia Mother   . Allergic rhinitis Mother   . Diabetes Maternal Grandmother   . Heart disease Maternal Grandmother   . Cancer Maternal Grandmother        sarcoma in arm  . Kidney disease Maternal Grandmother   . Allergic rhinitis Maternal Grandmother   . Heart disease Maternal Grandfather        congestive heart failure  . Kidney disease Maternal Grandfather   . Cancer Maternal Grandfather        bladder  . Hypertension Maternal Grandfather   . Heart disease Paternal Grandmother   . Heart disease Paternal Grandfather   . COPD Paternal Grandfather   . Mental illness Paternal Grandfather        PTSD  . Asthma Neg Hx   . Eczema Neg Hx   . Urticaria Neg Hx     Past Medical History:  Diagnosis Date  . Anemia   . Colitis   . Colitis   . Miscarriage   . Moderate persistent asthma, uncomplicated 22/02/5425  . Recurrent upper respiratory infection (URI)     Past Surgical History:  Procedure Laterality Date  . CESAREAN SECTION      Current Outpatient Medications  Medication Sig Dispense Refill  . Albuterol Sulfate (PROAIR RESPICLICK) 062 (90 Base) MCG/ACT AEPB Inhale 1 puff into the lungs every 6 (six) hours as needed. 1 each 0  . amLODipine (NORVASC) 2.5 MG tablet Take 1 tablet (2.5 mg total) by mouth daily. 90 tablet 3  . azelastine (ASTELIN) 0.1 % nasal spray Place 2 sprays into both nostrils daily. Use in each nostril as directed 30 mL 3  . Bioflavonoid Products (VITAMIN C) CHEW Chew 2-3 tablets by mouth daily.    . budesonide-formoterol (SYMBICORT) 160-4.5 MCG/ACT inhaler Inhale 2 puffs into the lungs 2 (two) times daily. 1 Inhaler 5  . buPROPion (WELLBUTRIN XL) 150 MG 24 hr tablet Take 1 tablet (150 mg total) by mouth daily.  90 tablet 3  . cetirizine (ZYRTEC) 10 MG tablet Take 10 mg by mouth daily as needed (seasonal allergies).    Marland Kitchen ELDERBERRY PO Take 1 tablet by mouth daily.    Marland Kitchen  fluticasone (FLONASE) 50 MCG/ACT nasal spray Place 2 sprays into both nostrils daily. 16 g 3  . Melatonin 5 MG TABS Take by mouth as needed.    . Multiple Vitamin (MULTIVITAMIN WITH MINERALS) TABS tablet Take 1 tablet by mouth daily.    Marland Kitchen omeprazole (PRILOSEC) 20 MG capsule Take 1 capsule (20 mg total) by mouth daily. 90 capsule 3  . ondansetron (ZOFRAN) 4 MG tablet Take 1 tablet (4 mg total) by mouth every 8 (eight) hours as needed for nausea or vomiting. 20 tablet 0  . traZODone (DESYREL) 50 MG tablet Take 0.5-1 tablets (25-50 mg total) by mouth at bedtime as needed for sleep. 90 tablet 3  . Liraglutide -Weight Management (SAXENDA) 18 MG/3ML SOPN Inject 3 mg into the skin daily at 3 pm for 30 days. (Patient not taking: Reported on 03/17/2018) 3 mL 2   No current facility-administered medications for this visit.    Platelets 150 - 450 x10E3/uL 409  428High  R 366 R 359 R  Neutrophils Not Estab. % 65  67     Lymphs Not Estab. % 23  25     Monocytes Not Estab. % 7  6     Eos Not Estab. % 4  2     Basos Not Estab. % 1  0     Neutrophils Absolute 1.4 - 7.0 x10E3/uL 6.7  7.1High      Lymphocytes Absolute 0.7 - 3.1 x10E3/uL 2.4  2.6     Monocytes Absolute 0.1 - 0.9 x10E3/uL 0.7  0.6     EOS (ABSOLUTE) 0.0 - 0.4 x10E3/uL 0.4  0.2     Basophils Absolute 0.0 - 0.2 x10E3/uL 0.1  0.0     Immature Granulocytes Not Estab. % 0  0     Immature Grans (Abs) 0.0 - 0.1 x10E3/uL 0.0  0.0      Creatinine, Ser 0.57 - 1.00 mg/dL 0.63   GFR calc non Af Amer >59 mL/min/1.73 116   GFR calc Af Amer >59 mL/min/1.73 133   BUN/Creatinine Ratio 9 - 23 11   Sodium 134 - 144 mmol/L 140   Potassium 3.5 - 5.2 mmol/L 4.2   Chloride 96 - 106 mmol/L 104   CO2 20 - 29 mmol/L 23   Calcium 8.7 - 10.2 mg/dL 9.3   Total Protein 6.0 - 8.5 g/dL 6.9   Albumin 3.5 -  5.5 g/dL 4.1   Comment:  **Effective February 09, 2018 Albumin reference**     Allergies as of 03/17/2018 - Review Complete 03/17/2018  Allergen Reaction Noted  . Other Anaphylaxis 05/04/2016  . Dust mite extract  03/17/2018  . Lavender oil Hives 02/02/2016  . Mold extract [trichophyton]  03/17/2018    Vitals: BP (!) 131/91   Pulse 95   Ht 5\' 6"  (1.676 m)   Wt 268 lb (121.6 kg)   BMI 43.26 kg/m  Last Weight:  Wt Readings from Last 1 Encounters:  03/17/18 268 lb (121.6 kg)   YQM:VHQI mass index is 43.26 kg/m.     Last Height:   Ht Readings from Last 1 Encounters:  03/17/18 5\' 6"  (1.676 m)    Physical exam:  General: The patient is awake, alert and appears not in acute distress. The patient is well groomed. Head: Normocephalic, atraumatic. Neck is supple. Mallampati: 3- lateral narrowed.   neck circumference:15 ". Nasal airflow congested , Retrognathia is seen. TMJ click right.  Cardiovascular:   Respiratory: Lungs are clear to auscultation. Skin:  Without evidence of edema, or rash Trunk: BMI is 43.26 . The patient's posture is erect.   Neurologic exam : The patient is awake and alert, oriented to place and time.  Attention span & concentration ability appears normal.  Speech is fluent,  without dysarthria, but mild hoarseness- clearing throat frequently.  Mood and affect are appropriate.  Cranial nerves: Pupils are equal and briskly reactive to light.  Extraocular movements  in vertical and horizontal planes intact and without nystagmus. Visual fields by finger perimetry are intact. Hearing to finger rub intact.  Facial sensation intact to fine touch. Facial motor strength is symmetric and tongue and uvula move midline. Shoulder shrug was symmetrical.   Motor exam: Normal tone, muscle bulk and symmetric strength in all extremities.  Sensory:  Fine touch, pinprick and vibration were tested in all extremities. Proprioception tested in the upper extremities was  normal.  Coordination: Finger-to-nose maneuver  normal without evidence of ataxia, dysmetria or tremor.  Gait and station: Patient walks without assistive device . Strength within normal limits. Stance and gait  were tested .  Deep tendon reflexes: in the  upper and lower extremities are symmetric and intact.   Assessment:  After physical and neurologic examination, review of laboratory studies,  Personal review of imaging studies, reports of other /same  Imaging studies, results of polysomnography and / or neurophysiology testing and pre-existing records as far as provided in visit., my assessment is:    1) high risk of sleep apnea , OSA type by BMI, history of asthma and GERD may well be related, she has a small upper airway and rhinitis, allergic airway responses. Considers herself a mouth breather.  Husband has been witnessing snoring and apnea.   In addition there is the strain of having fluctuating work hours and working through the night hours.  Many patients do not get enough daytime sleep but that does not seem to be the case here.  If she has trouble to initiate sleep she had a prescription for as needed trazodone in the past and that was enough to assure daytime sleep.  Or evaluation will be mainly focusing on obstructive sleep apnea, GERD related apnea, arousals in relation to hypoxemia, hypercapnia, hypercapnia and apnea.    The patient was advised of the nature of the diagnosed disorder , the treatment options and the  risks for general health and wellness arising from not treating the condition.   I spent more than 40 minutes of face to face time with the patient.  Greater than 50% of time was spent in counseling and coordination of care. We have discussed the diagnosis and differential and I answered the patient's questions.    Plan:  Treatment plan and additional workup :  SPLIT night study - in facility, if available use CO2,  I like to have a night time study.    Obesity - referral to  Medical weight and wellness management of weight-    Larey Seat, MD 4/85/4627, 03:50 AM  Certified in Neurology by ABPN Certified in [place] by [facility] [address] [place], [place] [address]

## 2018-03-17 NOTE — Telephone Encounter (Signed)
Insurance denied appeal for [employer]

## 2018-03-18 NOTE — Telephone Encounter (Signed)
I do not know if she did this and her [place] was denied on appeal also

## 2018-03-18 NOTE — Telephone Encounter (Signed)
We have 1 more shot in the way of possibly achieving this, if we measure her abdominal circumference then we could qualify her possibly for metabolic syndrome and they would likely cover Saxenda under metabolic syndrome but I would need the abdominal waist circumference to do this.

## 2018-03-18 NOTE — Telephone Encounter (Signed)
Patient was informed on 03/06/2018 that [place] was denied and that Dr. Warrick Parisian recommended she measure her waist circumference to see if she would qualify for metabolic syndrome and possibly they would cover it that way.  Do you know if she ever did this?

## 2018-03-20 NOTE — Telephone Encounter (Signed)
Okay we will try at her next visit

## 2018-03-30 ENCOUNTER — Ambulatory Visit (INDEPENDENT_AMBULATORY_CARE_PROVIDER_SITE_OTHER): Payer: No Typology Code available for payment source | Admitting: Family Medicine

## 2018-03-30 ENCOUNTER — Encounter: Payer: Self-pay | Admitting: Family Medicine

## 2018-03-30 VITALS — BP 126/91 | HR 83 | Temp 97.0°F | Ht 66.0 in | Wt 272.8 lb

## 2018-03-30 DIAGNOSIS — I10 Essential (primary) hypertension: Secondary | ICD-10-CM

## 2018-03-30 MED ORDER — AMLODIPINE BESYLATE 5 MG PO TABS: 5.0000 mg | ORAL_TABLET | Freq: Every day | ORAL | 1 refills | Status: DC

## 2018-03-30 NOTE — Progress Notes (Signed)
BP (!) 126/91   Pulse 83   Temp (!) 97 F (36.1 C) (Oral)   Ht 5\' 6"  (1.676 m)   Wt 272 lb 12.8 oz (123.7 kg)   BMI 44.03 kg/m    Subjective:    Patient ID: Molly [name], female    DOB: February 16, 1980, 38 y.o.   MRN: [id number]  HPI: Molly [name] is a 38 y.o. female presenting on 03/30/2018 for Hypertension (4 week follow up)   HPI Hypertension Patient is currently on amlodipine 2.5, and their blood pressure today is 126/91. Patient denies any lightheadedness or dizziness. Patient denies headaches, blurred vision, chest pains, shortness of breath, or weakness. Denies any side effects from medication and is content with current medication.   Patient wants to start weight loss medication but we are trending her blood pressures under control first before restart the phentermine because of the effects it will have on the blood pressure will monitor for now.  She is up 4 pounds from previous.  She has been trying to make some dietary and lifestyle changes.  Relevant past medical, surgical, family and social history reviewed and updated as indicated. Interim medical history since our last visit reviewed. Allergies and medications reviewed and updated.  Review of Systems  Constitutional: Negative for chills and fever.  Eyes: Negative for visual disturbance.  Respiratory: Negative for chest tightness and shortness of breath.   Cardiovascular: Negative for chest pain and leg swelling.  Musculoskeletal: Negative for back pain and gait problem.  Skin: Negative for rash.  Neurological: Negative for dizziness, weakness, light-headedness and headaches.  Psychiatric/Behavioral: Negative for agitation and behavioral problems.  All other systems reviewed and are negative.   Per HPI unless specifically indicated above   Allergies as of 03/30/2018      Reactions   Other Anaphylaxis   Reaction to hay (something in [place] pig's cage), dog and mice   Dust Mite Extract    Lavender Oil Hives   Mold  Extract [trichophyton]       Medication List       Accurate as of March 30, 2018  3:35 PM. Always use your most recent med list.        Albuterol Sulfate 108 (90 Base) MCG/ACT Aepb Commonly known as:  ProAir RespiClick Inhale 1 puff into the lungs every 6 (six) hours as needed.   amLODipine 5 MG tablet Commonly known as:  NORVASC Take 1 tablet (5 mg total) by mouth daily.   azelastine 0.1 % nasal spray Commonly known as:  ASTELIN Place 2 sprays into both nostrils daily. Use in each nostril as directed   budesonide-formoterol 160-4.5 MCG/ACT inhaler Commonly known as:  Symbicort Inhale 2 puffs into the lungs 2 (two) times daily.   buPROPion 150 MG 24 hr tablet Commonly known as:  WELLBUTRIN XL Take 1 tablet (150 mg total) by mouth daily.   cetirizine 10 MG tablet Commonly known as:  ZYRTEC Take 10 mg by mouth daily as needed (seasonal allergies).   ELDERBERRY PO Take 1 tablet by mouth daily.   fluticasone 50 MCG/ACT nasal spray Commonly known as:  FLONASE Place 2 sprays into both nostrils daily.   Melatonin 5 MG Tabs Take by mouth as needed.   multivitamin with minerals Tabs tablet Take 1 tablet by mouth daily.   omeprazole 20 MG capsule Commonly known as:  PRILOSEC Take 1 capsule (20 mg total) by mouth daily.   ondansetron 4 MG tablet Commonly known as:  Zofran Take 1  tablet (4 mg total) by mouth every 8 (eight) hours as needed for nausea or vomiting.   traZODone 50 MG tablet Commonly known as:  DESYREL Take 0.5-1 tablets (25-50 mg total) by mouth at bedtime as needed for sleep.   Vitamin C Chew Chew 2-3 tablets by mouth daily.          Objective:    BP (!) 126/91   Pulse 83   Temp (!) 97 F (36.1 C) (Oral)   Ht 5\' 6"  (1.676 m)   Wt 272 lb 12.8 oz (123.7 kg)   BMI 44.03 kg/m   Wt Readings from Last 3 Encounters:  03/30/18 272 lb 12.8 oz (123.7 kg)  03/17/18 268 lb (121.6 kg)  03/02/18 272 lb 6.4 oz (123.6 kg)    Physical Exam Vitals  signs and nursing note reviewed.  Constitutional:      General: She is not in acute distress.    Appearance: She is well-developed. She is not diaphoretic.  Eyes:     Conjunctiva/sclera: Conjunctivae normal.  Cardiovascular:     Rate and Rhythm: Normal rate and regular rhythm.     Heart sounds: Normal heart sounds. No murmur.  Pulmonary:     Effort: Pulmonary effort is normal. No respiratory distress.     Breath sounds: Normal breath sounds. No wheezing.  Musculoskeletal: Normal range of motion.        General: No tenderness.  Skin:    General: Skin is warm and dry.     Findings: No rash.  Neurological:     Mental Status: She is alert and oriented to person, place, and time.     Coordination: Coordination normal.  Psychiatric:        Behavior: Behavior normal.       Assessment & Plan:   Problem List Items Addressed This Visit      Cardiovascular and Mediastinum   Essential hypertension - Primary   Relevant Medications   amLODipine (NORVASC) 5 MG tablet     Other   Morbid obesity ([place])       Follow up plan: Return in about 4 weeks (around 04/27/2018), or if symptoms worsen or fail to improve, for Hypertension and obesity.  Counseling provided for all of the vaccine components No orders of the defined types were placed in this encounter.   Caryl Pina, MD [facility] 03/30/2018, 3:35 PM

## 2018-04-06 ENCOUNTER — Encounter: Payer: Self-pay | Admitting: Neurology

## 2018-04-07 MED FILL — SYMBICORT 160-4.5 MCG INH: 160-4.5 | 30 days supply | Qty: 10 | Fill #1 | Status: TO

## 2018-04-24 MED FILL — AMLODIPINE BESYLATE 5 MG TA: 5 | 30 days supply | Qty: 30 | Fill #0

## 2018-05-11 MED FILL — FLUTICASONE PROP 50 MCG SPR: 50 | 30 days supply | Qty: 16 | Fill #0

## 2018-05-11 MED FILL — SYMBICORT 160-4.5 MCG INH: 160-4.5 | 30 days supply | Qty: 10 | Fill #0

## 2018-05-27 MED FILL — buPROPion HCL ER (XL) 150 M: 150 | 90 days supply | Qty: 90 | Fill #0

## 2018-05-29 ENCOUNTER — Ambulatory Visit (INDEPENDENT_AMBULATORY_CARE_PROVIDER_SITE_OTHER): Payer: No Typology Code available for payment source

## 2018-05-29 ENCOUNTER — Ambulatory Visit (INDEPENDENT_AMBULATORY_CARE_PROVIDER_SITE_OTHER): Payer: No Typology Code available for payment source | Admitting: Nurse Practitioner

## 2018-05-29 ENCOUNTER — Other Ambulatory Visit: Payer: Self-pay

## 2018-05-29 ENCOUNTER — Encounter: Payer: Self-pay | Admitting: Nurse Practitioner

## 2018-05-29 VITALS — BP 136/95 | HR 82 | Temp 97.1°F | Ht 66.0 in | Wt 274.0 lb

## 2018-05-29 DIAGNOSIS — S63681A Other sprain of right thumb, initial encounter: Secondary | ICD-10-CM | POA: Diagnosis not present

## 2018-05-29 DIAGNOSIS — M79644 Pain in right finger(s): Secondary | ICD-10-CM | POA: Diagnosis not present

## 2018-05-29 NOTE — Patient Instructions (Signed)
Thumb Sprain    A thumb sprain is an injury to one of the bands of tissue (ligaments) that connect the bones in your thumb. The ligament may be stretched too much, or it may be torn. A tear can be either partial or complete. How bad, or severe, the sprain is depends on how much of the ligament was damaged or torn.  What are the causes?  A thumb sprain is often caused by a fall or an accident, such as when you hold your hands out to catch something or to protect yourself.  What increases the risk?  This injury is more likely to occur in people who play sports that involve:  · A risk of falling, such as skiing.  · Catching an object, such as basketball.  What are the signs or symptoms?  Symptoms of this condition include:  · Not being able to move the thumb normally.  · Swelling.  · Tenderness.  · Bruising.  How is this diagnosed?  This condition may be diagnosed based on:  · Your symptoms and medical history. Your health care provider may ask about any recent injuries to your thumb.  · A physical exam.  · Imaging studies such as X-ray, ultrasound, or MRI.  How is this treated?  Treatment for this condition depends on how severe your sprain is.  · If your ligament is overstretched or partially torn, treatment usually involves keeping your thumb in a fixed position (immobilization) for at least 4 to 6 weeks. Your health care provider will apply a bandage, cast, or splint to keep your thumb from moving until it heals.  · If your ligament is fully torn, you may need surgery to reconnect the ligament to the bone. After surgery, you will need to wear a cast or splint on your thumb.  Your health care provider may also recommend physical therapy to strengthen your thumb.  Follow these instructions at home:  If you have a splint or bandage:  · Wear the splint or bandage as told by your health care provider. Remove it only as told by your health care provider.  · Loosen the splint or bandage if your thumb or fingers tingle,  become numb, or turn cold and blue.  · Keep the splint or bandage clean and dry.  If you have a cast:  · Do not stick anything inside the cast to scratch your skin. Doing that increases your risk of infection.  · Check the skin around the cast every day. Tell your health care provider about any concerns.  · You may put lotion on dry skin around the edges of the cast. Do not put lotion on the skin underneath the cast.  · Keep the cast clean and dry.  Bathing  · Do not take baths, swim, or use a hot tub until your health care provider approves. Ask your health care provider if you may take showers. You may only be allowed to take sponge baths.  · If your splint, bandage, or cast is not waterproof:  ? Do not let it get wet.  ? Cover it with a watertight covering to protect it from water when you take a bath or shower.  Managing pain, stiffness, and swelling    · If directed, put ice on your thumb:  ? If you have a removable splint, remove it as told by your health care provider.  ? Put ice in a plastic bag.  ? Place a towel between your skin   and the bag, or between your cast and the bag.  ? Leave the ice on for 20 minutes, 2-3 times a day.  · Move your fingers often to avoid stiffness and to lessen swelling.  · Raise (elevate) your hand above the level of your heart while you are sitting or lying down.  Activity  · Return to your normal activities as told by your health care provider. Ask your health care provider what activities are safe for you.  · Do physical therapy exercises as directed. After your splint or cast is removed, your health care provider may recommend that you:  ? Move your thumb in circles.  ? Touch your thumb to your pinky finger.  ? Do these exercises several times a day.  · Ask your health care provider if you may use a hand exerciser to strengthen your muscles.  · If your thumb feels stiff while you are exercising it, try doing the exercises while soaking your hand in warm water.  Driving  · Do  not drive until your health care provider approves.  · Donot drive or use heavy machinery while taking prescription pain medicine.  General instructions  · Do not put pressure on any part of the cast or splint until it is fully hardened, if applicable. This may take several hours.  · Take over-the-counter and prescription medicines only as told by your health care provider.  · Do not use any products that contain nicotine or tobacco, such as cigarettes and e-cigarettes. These can delay healing. If you need help quitting, ask your health care provider.  · Do not wear rings on your injured thumb.  · Keep all follow-up visits as told by your health care provider. This is important.  Contact a health care provider if you have:  · Pain that gets worse or does not get better with medicine.  · Bruising or swelling that gets worse.  · Your cast or splint is damaged.  Get help right away if:  · Your thumb feels numb, tingles, turns cold, or turns blue, even after loosening your splint or bandage (if applicable).  Summary  · A thumb sprain is an injury to one of the bands of tissue (ligaments) that connect the bones in your thumb.  · Thumb sprains are more likely to occur in people who play sports that involve a risk of falling or having to catch an object.  · Treatment will depend on how severe the sprain is, but it will require keeping the thumb in a fixed position. It might require surgery.  · Make sure you understand and follow all of your health care provider's instructions for home care.  This information is not intended to replace advice given to you by your health care provider. Make sure you discuss any questions you have with your health care provider.  Document Released: 02/15/2004 Document Revised: 01/30/2017 Document Reviewed: 01/30/2017  Elsevier Interactive Patient Education © 2019 Elsevier Inc.

## 2018-05-29 NOTE — Progress Notes (Signed)
   Subjective:    Patient ID: Molly [name], female    DOB: 06/23/1980, 38 y.o.   MRN: [id number]   Chief Complaint: thumb pain [place] while camping)   HPI Patient comes in today c/o right thumb pain. She has been camping and fell and injuried her right thumb. sore when moving. Mild swelling.   Review of Systems  Constitutional: Negative.   Genitourinary: Negative.   Musculoskeletal: Negative.   Neurological: Negative.   Psychiatric/Behavioral: Negative.   All other systems reviewed and are negative.      Objective:   Physical Exam Vitals signs and nursing note reviewed.  Constitutional:      Appearance: Normal appearance.  Cardiovascular:     Rate and Rhythm: Normal rate and regular rhythm.     Heart sounds: Normal heart sounds.  Pulmonary:     Effort: Pulmonary effort is normal.     Breath sounds: Normal breath sounds.  Musculoskeletal:     Comments: FROM of right thumb but slowly. Has pain at base of thumb with mild swelling   Skin:    General: Skin is warm and dry.  Neurological:     General: No focal deficit present.     Mental Status: She is alert and oriented to person, place, and time.  Psychiatric:        Mood and Affect: Mood normal.        Behavior: Behavior normal.   BP (!) 136/95   Pulse 82   Temp (!) 97.1 F (36.2 C) (Oral)   Ht 5\' 6"  (1.676 m)   Wt 274 lb (124.3 kg)   BMI 44.22 kg/m   Thumb xray- no visible fracture      Assessment & Plan:  Molly [name] in today with chief complaint of thumb pain (Fell while camping)   1. Pain of right thumb - DG Finger Thumb Right; Future  2. Other sprain of right thumb, initial encounter Thumb spica splint Motrin or tylenol OTC RTO prn.  Molly [name] Done, FNP

## 2018-06-05 MED FILL — AMLODIPINE BESYLATE 5 MG TA: 5 | 30 days supply | Qty: 30 | Fill #1

## 2018-06-16 MED FILL — SYMBICORT 160-4.5 MCG INH: 160-4.5 | 30 days supply | Qty: 10 | Fill #1

## 2018-07-09 MED FILL — OMEPRAZOLE 20 MG CPDR: 20 | 90 days supply | Qty: 90 | Fill #1

## 2018-07-16 ENCOUNTER — Other Ambulatory Visit: Payer: Self-pay | Admitting: Family Medicine

## 2018-07-16 DIAGNOSIS — I10 Essential (primary) hypertension: Secondary | ICD-10-CM

## 2018-07-16 MED FILL — AMLODIPINE BESYLATE 5 MG TA: 5 | 90 days supply | Qty: 90 | Fill #0

## 2018-07-16 MED FILL — SYMBICORT 160-4.5 MCG INH: 160-4.5 | 30 days supply | Qty: 10 | Fill #0

## 2018-07-29 ENCOUNTER — Other Ambulatory Visit: Payer: Self-pay

## 2018-07-29 ENCOUNTER — Encounter: Payer: Self-pay | Admitting: Allergy & Immunology

## 2018-07-29 ENCOUNTER — Ambulatory Visit (INDEPENDENT_AMBULATORY_CARE_PROVIDER_SITE_OTHER): Payer: No Typology Code available for payment source | Admitting: Allergy & Immunology

## 2018-07-29 ENCOUNTER — Ambulatory Visit: Payer: No Typology Code available for payment source | Admitting: Allergy & Immunology

## 2018-07-29 DIAGNOSIS — J302 Other seasonal allergic rhinitis: Secondary | ICD-10-CM

## 2018-07-29 DIAGNOSIS — J4540 Moderate persistent asthma, uncomplicated: Secondary | ICD-10-CM | POA: Diagnosis not present

## 2018-07-29 DIAGNOSIS — J3089 Other allergic rhinitis: Secondary | ICD-10-CM

## 2018-07-29 MED ORDER — LEVOCETIRIZINE DIHYDROCHLORIDE 5 MG PO TABS: 5.0000 mg | ORAL_TABLET | Freq: Every evening | ORAL | 5 refills | Status: DC

## 2018-07-29 MED ORDER — FLUTICASONE PROPIONATE 50 MCG/ACT NA SUSP: 1.0000 | Freq: Every day | NASAL | 3 refills | Status: DC

## 2018-07-29 MED ORDER — PROAIR RESPICLICK 108 (90 BASE) MCG/ACT IN AEPB: 1.0000 | INHALATION_SPRAY | Freq: Four times a day (QID) | RESPIRATORY_TRACT | 1 refills | Status: DC | PRN

## 2018-07-29 MED ORDER — BUDESONIDE-FORMOTEROL FUMARATE 160-4.5 MCG/ACT IN AERO: 2.0000 | INHALATION_SPRAY | Freq: Two times a day (BID) | RESPIRATORY_TRACT | 5 refills | Status: DC

## 2018-07-29 MED FILL — FLUTICASONE PROP 50 MCG SPR: 50 | 60 days supply | Qty: 16 | Fill #0

## 2018-07-29 MED FILL — PROAIR RESPICLICK INHAL PWD: 108 (90 BAS | 50 days supply | Qty: 1 | Fill #0

## 2018-07-29 MED FILL — LEVOCETIRIZINE 5 MG TABLET: 5 | 30 days supply | Qty: 30 | Fill #0

## 2018-07-29 NOTE — Patient Instructions (Addendum)
1. Moderate persistent asthma, uncomplicated - It seems that the current regimen is working very well.  - We are not going to make any medication changes at this time.  - Daily controller medication(s): Symbicort 160/4.20mcg two puffs twice daily with spacer - Prior to physical activity: ProAir 2 puffs 10-15 minutes before physical activity. - Rescue medications: ProAir 4 puffs every 4-6 hours as needed - Asthma control goals:  * Full participation in all desired activities (may need albuterol before activity) * Albuterol use two time or less a week on average (not counting use with activity) * Cough interfering with sleep two time or less a month * Oral steroids no more than once a year * No hospitalizations  2. Chronic rhinitis (dust mite, cockroach, grasses, trees, and ragweed) - We will send in a script for the Xyzal to see if we can get the insurance to cover it.  - Continue taking: Dymista (fluticasone/azelastine) two sprays per nostril 1-2 times daily as needed (generic available now) - You can use an extra dose of the antihistamine, if needed, for breakthrough symptoms.  - Consider nasal saline rinses 1-2 times daily to remove allergens from the nasal cavities as well as help with mucous clearance (this is especially helpful to do before the nasal sprays are given) - Consider allergy shots as a means of long-term control, although at this time her symptoms seem well controlled.   4. Return in about 6 months (around 01/29/2019). This can be an in-person, a virtual Webex or a telephone follow up visit.   Please inform us of any Emergency Department visits, hospitalizations, or changes in symptoms. Call us before going to the ED for breathing or allergy symptoms since we might be able to fit you in for a sick visit. Feel free to contact us anytime with any questions, problems, or concerns.  It was a pleasure to talk to you today today!  Websites that have reliable patient information:  1. American Academy of Asthma, Allergy, and Immunology: www.aaaai.org 2. Food Allergy Research and Education (FARE): foodallergy.org 3. Mothers of Asthmatics: http://www.asthmacommunitynetwork.org 4. American College of Allergy, Asthma, and Immunology: www.acaai.org  "Like" [place] on Facebook and Instagram for our latest updates!      Make sure you are registered to vote! If you have moved or changed any of your contact information, you will need to get this updated before voting!  In some cases, you MAY be able to register to vote online: [email or web address]    Voter ID laws are NOT going into effect for the General Election in November 2020! DO NOT let this stop you from exercising your right to vote!   Absentee voting is the SAFEST way to vote during the coronavirus pandemic!   Download and print an absentee ballot request form at [email or web address] or you can scan the QR code below with your smart phone:      More information on absentee ballots can be found here: [email or web address]

## 2018-07-29 NOTE — Progress Notes (Signed)
RE: Molly [name] MRN: [id number] DOB: [birth year] Date of Telemedicine Visit: 07/29/2018  Referring provider: Dettinger, Fransisca Kaufmann, MD Primary care provider: Dettinger, Fransisca Kaufmann, MD  Chief Complaint: Asthma   Telemedicine Follow Up Visit via Telephone: I connected with [name] for a follow up on 07/29/18 by telephone and verified that I am speaking with the correct person using two identifiers.   I discussed the limitations, risks, security and privacy concerns of performing an evaluation and management service by telephone and the availability of in person appointments. I also discussed with the patient that there may be a patient responsible charge related to this service. The patient expressed understanding and agreed to proceed.  Patient is at home.  Provider is at the office.  Visit start time: 10:44 AM Visit end time: 11:00 AM Insurance consent/check in by: [facility] consent and medical assistant/nurse: Garlon Hatchet  History of Present Illness:  She is a 38 y.o. female, who is being followed for persistent asthma as well as allergic rhinitis. Her previous allergy office visit was in January 2020 with Dr. Ernst Bowler.  At that visit, she was doing much better.  We did do lung testing which was excellent.  We continued her on Symbicort 160/4.5 mcg 2 puffs twice daily.  We also continued albuterol as needed.  She has a history of sensitization to dust mite, cockroach, grasses, trees, and ragweed.  We stopped the RyVent because it was not covered and started Xyzal 5 mg daily instead.  We also continue Dymista 2 sprays per nostril up to twice daily.  Since last visit, she has mostly done well.  Her cough has nearly completely resolved.  Asthma/Respiratory Symptom History: She remains on the Symbicort two puffs two puffs in the morning and one puff at night. She does not feel that she needs it at night all of the time. She has been using the albuterol more recently. She has noticed that  the heat and humidity and the mask at work seems to make things tight and winded.  She has needed no prednisone for her breathing.  She has not needed to go to the emergency room or urgent care.  Allergic Rhinitis Symptom History: She is using the Flonase every mornig. She is using the Astelin only as needed. She has not needed any antibiotics at all since the last visit. She has not been on prednisone since the last visit at all. She did end up re-homing their bunny which was a large trigger for her. Her son was not happy about this decision.   She continues to remain busy at work.  She works on Fridays, Saturdays, and Sundays.  Thankfully she has had no covert exposures that she knows of.  Otherwise, there have been no changes to her past medical history, surgical history, family history, or social history.  Assessment and Plan:  Keaghan is a 38 y.o. female with:  Moderate persistent asthma, uncomplicated  Perennial and seasonal allergic rhinitis (dust mite, cockroach, grasses, trees, and ragweed)  Cough - improved with regular use of Symbicort   1. Moderate persistent asthma, uncomplicated - It seems that the current regimen is working very well.  - We are not going to make any medication changes at this time.  - Daily controller medication(s): Symbicort 160/4.5mcg two puffs twice daily with spacer - Prior to physical activity: ProAir 2 puffs 10-15 minutes before physical activity. - Rescue medications: ProAir 4 puffs every 4-6 hours as needed - Asthma control goals:  *  Full participation in all desired activities (may need albuterol before activity) * Albuterol use two time or less a week on average (not counting use with activity) * Cough interfering with sleep two time or less a month * Oral steroids no more than once a year * No hospitalizations  2. Chronic rhinitis (dust mite, cockroach, grasses, trees, and ragweed) - We will send in a script for the Xyzal to see if we can  get the insurance to cover it.  - Continue taking: Dymista (fluticasone/azelastine) two sprays per nostril 1-2 times daily as needed (generic available now) - You can use an extra dose of the antihistamine, if needed, for breakthrough symptoms.  - Consider nasal saline rinses 1-2 times daily to remove allergens from the nasal cavities as well as help with mucous clearance (this is especially helpful to do before the nasal sprays are given) - Consider allergy shots as a means of long-term control, although at this time her symptoms seem well controlled.   4. Return in about 6 months (around 01/29/2019). This can be an in-person, a virtual Webex or a telephone follow up visit.   Diagnostics: None.  Medication List:  Current Outpatient Medications  Medication Sig Dispense Refill  . Albuterol Sulfate (PROAIR RESPICLICK) 789 (90 Base) MCG/ACT AEPB Inhale 1 puff into the lungs every 6 (six) hours as needed. 1 each 0  . amLODipine (NORVASC) 5 MG tablet TAKE 1 TABLET BY MOUTH ONCE DAILY 90 tablet 1  . azelastine (ASTELIN) 0.1 % nasal spray Place 2 sprays into both nostrils daily. Use in each nostril as directed 30 mL 3  . Bioflavonoid Products (VITAMIN C) CHEW Chew 2-3 tablets by mouth daily.    . budesonide-formoterol (SYMBICORT) 160-4.5 MCG/ACT inhaler Inhale 2 puffs into the lungs 2 (two) times daily. 1 Inhaler 5  . buPROPion (WELLBUTRIN XL) 150 MG 24 hr tablet Take 1 tablet (150 mg total) by mouth daily. 90 tablet 3  . cetirizine (ZYRTEC) 10 MG tablet Take 10 mg by mouth daily as needed (seasonal allergies).    Marland Kitchen ELDERBERRY PO Take 1 tablet by mouth daily.    . fluticasone (FLONASE) 50 MCG/ACT nasal spray Place 2 sprays into both nostrils daily. 16 g 3  . Melatonin 5 MG TABS Take by mouth as needed.    . Multiple Vitamin (MULTIVITAMIN WITH MINERALS) TABS tablet Take 1 tablet by mouth daily.    Marland Kitchen omeprazole (PRILOSEC) 20 MG capsule Take 1 capsule (20 mg total) by mouth daily. 90 capsule 3  .  ondansetron (ZOFRAN) 4 MG tablet Take 1 tablet (4 mg total) by mouth every 8 (eight) hours as needed for nausea or vomiting. 20 tablet 0  . traZODone (DESYREL) 50 MG tablet Take 0.5-1 tablets (25-50 mg total) by mouth at bedtime as needed for sleep. 90 tablet 3   No current facility-administered medications for this visit.    Allergies: Allergies  Allergen Reactions  . Other Anaphylaxis    Reaction to hay (something in [place] pig's cage), dog and mice  . Dust Mite Extract   . Lavender Oil Hives  . Mold Extract [Trichophyton]    I reviewed her past medical history, social history, family history, and environmental history and no significant changes have been reported from previous visits.  Review of Systems  Constitutional: Negative for activity change, appetite change, chills, diaphoresis, fatigue, fever and unexpected weight change.  HENT: Negative for congestion, postnasal drip, rhinorrhea, sinus pressure and sore throat.   Eyes: Negative for  pain, discharge, redness and itching.  Respiratory: Negative for shortness of breath, wheezing and stridor.   Gastrointestinal: Negative for diarrhea, nausea and vomiting.  Musculoskeletal: Negative for arthralgias, joint swelling and myalgias.  Skin: Negative for rash.  Allergic/Immunologic: Positive for environmental allergies. Negative for food allergies.    Objective:  Physical exam not obtained as encounter was done via telephone.   Previous notes and tests were reviewed.  I discussed the assessment and treatment plan with the patient. The patient was provided an opportunity to ask questions and all were answered. The patient agreed with the plan and demonstrated an understanding of the instructions.   The patient was advised to call back or seek an in-person evaluation if the symptoms worsen or if the condition fails to improve as anticipated.  I provided 16 minutes of non-face-to-face time during this encounter.  It was my  pleasure to participate in [place] care today. Please feel free to contact me with any questions or concerns.   Sincerely,  Valentina Shaggy, MD

## 2018-07-29 NOTE — Progress Notes (Addendum)
Start 1044 Location: work secure area End: 1100

## 2018-08-07 MED FILL — SYMBICORT 160-4.5 MCG INH: 160-4.5 | 30 days supply | Qty: 10 | Fill #0

## 2018-09-16 ENCOUNTER — Other Ambulatory Visit: Payer: Self-pay

## 2018-09-16 ENCOUNTER — Ambulatory Visit (INDEPENDENT_AMBULATORY_CARE_PROVIDER_SITE_OTHER): Payer: No Typology Code available for payment source | Admitting: Neurology

## 2018-09-16 DIAGNOSIS — J301 Allergic rhinitis due to pollen: Secondary | ICD-10-CM

## 2018-09-16 DIAGNOSIS — K219 Gastro-esophageal reflux disease without esophagitis: Secondary | ICD-10-CM

## 2018-09-16 DIAGNOSIS — J4540 Moderate persistent asthma, uncomplicated: Secondary | ICD-10-CM

## 2018-09-16 DIAGNOSIS — G4734 Idiopathic sleep related nonobstructive alveolar hypoventilation: Secondary | ICD-10-CM

## 2018-09-16 DIAGNOSIS — I10 Essential (primary) hypertension: Secondary | ICD-10-CM

## 2018-09-16 DIAGNOSIS — G4733 Obstructive sleep apnea (adult) (pediatric): Secondary | ICD-10-CM | POA: Diagnosis not present

## 2018-09-18 ENCOUNTER — Other Ambulatory Visit: Payer: Self-pay | Admitting: Family Medicine

## 2018-09-18 DIAGNOSIS — F411 Generalized anxiety disorder: Secondary | ICD-10-CM

## 2018-09-18 MED FILL — buPROPion HCL ER (XL) 150 M: 150 | 90 days supply | Qty: 90 | Fill #0

## 2018-09-18 NOTE — Telephone Encounter (Signed)
Last seen for depression 03/2018

## 2018-09-22 DIAGNOSIS — G4734 Idiopathic sleep related nonobstructive alveolar hypoventilation: Secondary | ICD-10-CM | POA: Insufficient documentation

## 2018-09-22 DIAGNOSIS — G4733 Obstructive sleep apnea (adult) (pediatric): Secondary | ICD-10-CM | POA: Insufficient documentation

## 2018-09-22 NOTE — Procedures (Signed)
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 Warrick Parisian. Patient states that she wanted to be tested for possible sleep apnea, she has been told that she snores and volume has worsened. She has gasped for breath, choked, has voiced GERD-reflux concerns. She complains of being tired during the day, she works odd/work hours, she uses melatonin to help with falling asleep and trazodone prn. 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 Morbid obesity, GERD, asthma.    The patient endorsed the Epworth Sleepiness Scale at 4 points.   The patient's weight 268 pounds with a height of 66 (inches), resulting in a BMI of 43.2 kg/m2. The patient's neck circumference measured 15 inches.  CURRENT MEDICATIONS: Albuterol, Norvasc, Astelin, Vitamin C, Symbicort, Wellbutrin, Zyrtec, Elderberry, Flonase, Multivitamin, Prilosec, Zofran, Desyrel, Saxenda   PROCEDURE:  This is a multichannel digital polysomnogram utilizing the Somnostar 11.2 system.  Electrodes and sensors were applied and monitored per AASM Specifications.   EEG, EOG, Chin and Limb EMG, were sampled at 200 Hz.  ECG, Snore and Nasal Pressure, Thermal Airflow, Respiratory Effort, CPAP Flow and Pressure, Oximetry was sampled at 50 Hz. Digital video and audio were recorded.      BASELINE STUDY Lights Out was at 00:04 and Lights On at 07:43.  Total recording time (TRT) was 459.5 minutes, with a total sleep time (TST) of 419 minutes.   The patient's sleep latency was 35.5 minutes.  REM latency was 409.5 minutes.  The sleep efficiency was 91.2 %.     SLEEP ARCHITECTURE: WASO (Wake after sleep onset) was 37 minutes.  There were 18 minutes in Stage N1, 367 minutes Stage  N2, 7.5 minutes Stage N3 and 26.5 minutes in Stage REM.  The percentage of Stage N1 was 4.3%, Stage N2 was 87.6%, Stage N3 was 1.8% and Stage R (REM sleep) was 6.3%.   RESPIRATORY ANALYSIS:  There were a total of 424 respiratory events:  135 obstructive apneas, 0 central apneas and 0 mixed apneas with a total of 135 apneas and an apnea index (AI) of 19.3 /hour. There were 289 hypopneas with a hypopnea index of 41.4 /hour. The patient also had several respiratory event related arousals (RERAs).  The total APNEA/HYPOPNEA INDEX (AHI) was 60.7/hour.  40 events occurred in REM sleep and 578 events in NREM. The REM AHI was 90.6 /hour, versus a non-REM AHI of 58.7. The patient spent 141.5 minutes of total sleep time in the supine position and 278 minutes in non-supine. The supine AHI was 51.3/h versus a non-supine AHI of 65.6/h.  OXYGEN SATURATION & C02:  The Wake baseline 02 saturation was 98%, with the lowest being 74%. Time spent below 89% saturation equaled 228 minutes.     The arousals were noted as: 68 were spontaneous, 0 were associated with PLMs, and 284 were associated with respiratory events. The patient had a total of 0 Periodic Limb Movements.   Audio and video analysis did not show any abnormal or unusual movements, behaviors, phonations or vocalizations.  The patient took one bathroom break. Snoring was noted. EKG was in keeping with normal sinus rhythm (NSR). Post-study, the patient indicated  that sleep was more restless than usual.   IMPRESSION:  1. Severest Obstructive Sleep Apnea (OSA), at AHI of 60.7/h, REM AHI was 90/h, and with little exacerbation by position.  2. Surprisingly high sleep efficiency.  3. Severe and prolonged hypoxemia- following a cyclic breathing pattern. See screen shots.  4. Loud Primary Snoring. 5. Normal EKG.  RECOMMENDATIONS:  1. Advise full-night, attended, CPAP titration study to optimize therapy and have the possibility to add oxygen if needed.      I  certify that I have reviewed the entire raw data recording prior to the issuance of this report in accordance with the Standards of Accreditation of the American Academy of Sleep Medicine (AASM)   Larey Seat, MD   09-22-2018 Diplomat, American Board of Psychiatry and Neurology  Diplomat, American Board of [place] Director, [place] [facility] [place] shots attached. Dr, Caryl Pina, MD

## 2018-09-22 NOTE — Addendum Note (Signed)
Addended by: Larey Seat on: 09/22/2018 06:09 PM   Modules accepted: Orders

## 2018-09-23 ENCOUNTER — Telehealth: Payer: Self-pay

## 2018-09-23 NOTE — Telephone Encounter (Signed)
-----   Message from Larey Seat, MD sent at 09/22/2018  6:09 PM EDT ----- IMPRESSION:   1. Severest Obstructive Sleep Apnea (OSA), at AHI of 60.7/h, REM  AHI was 90/h, and with little exacerbation by position.  2. Surprisingly high sleep efficiency.  3. Severe and prolonged hypoxemia- following a cyclic breathing  pattern. See screen shots.  4. Loud Primary Snoring.  5. Normal EKG.   RECOMMENDATIONS:   1. Advise full-night, attended, CPAP titration study to optimize  therapy and have the possibility to add oxygen if needed.

## 2018-09-23 NOTE — Telephone Encounter (Signed)
I called pt to discus her sleep study results. No answer, left a message asking her to call us back.

## 2018-09-24 ENCOUNTER — Encounter: Payer: Self-pay | Admitting: *Deleted

## 2018-09-24 NOTE — Telephone Encounter (Signed)
-----   Message from Gildardo Griffes, RN sent at 09/23/2018  5:18 PM EDT -----  ----- Message ----- From: Larey Seat, MD Sent: 09/22/2018   6:08 PM EDT To: Darleen Crocker, RN, Fransisca Kaufmann Dettinger, MD  IMPRESSION:   1. Severest Obstructive Sleep Apnea (OSA), at AHI of 60.7/h, REM  AHI was 90/h, and with little exacerbation by position.  2. Surprisingly high sleep efficiency.  3. Severe and prolonged hypoxemia- following a cyclic breathing  pattern. See screen shots.  4. Loud Primary Snoring.  5. Normal EKG.   RECOMMENDATIONS:   1. Advise full-night, attended, CPAP titration study to optimize  therapy and have the possibility to add oxygen if needed.

## 2018-09-24 NOTE — Telephone Encounter (Signed)
Called the pt and discussed her sleep study. Pt aware her results showed severe sleep apnea. Discussed AHI, snoring, but high sleep efficiency. Pt does agree to a full-night CPAP titration study with possibility of adding 02 to the CPAP. She understands the sleep team will call her to schedule this sleep study. She verbalized appreciation for the call and had no questions at the time of the call.

## 2018-09-24 NOTE — Telephone Encounter (Signed)
error 

## 2018-09-30 MED FILL — LEVOCETIRIZINE 5 MG TABLET: 5 | 30 days supply | Qty: 30 | Fill #0

## 2018-10-19 ENCOUNTER — Telehealth: Payer: Self-pay

## 2018-10-19 ENCOUNTER — Encounter: Payer: Self-pay | Admitting: Neurology

## 2018-10-19 NOTE — Telephone Encounter (Signed)
LVM for patient to call back when ready to reschedule CPAP study and covid test

## 2018-10-23 ENCOUNTER — Other Ambulatory Visit (HOSPITAL_COMMUNITY): Payer: No Typology Code available for payment source

## 2018-10-29 MED FILL — AMLODIPINE BESYLATE 5 MG TA: 5 | 90 days supply | Qty: 90 | Fill #0

## 2018-10-29 MED FILL — OMEPRAZOLE 20 MG CAP: 20 | 90 days supply | Qty: 90 | Fill #0

## 2018-11-09 MED FILL — LEVOCETIRIZINE 5 MG TABLET: 5 | 30 days supply | Qty: 30 | Fill #1

## 2018-11-09 MED FILL — SYMBICORT 160-4.5 MCG INH: 160-4.5 | 30 days supply | Qty: 10 | Fill #0

## 2018-11-10 ENCOUNTER — Other Ambulatory Visit (HOSPITAL_COMMUNITY)
Admission: RE | Admit: 2018-11-10 | Discharge: 2018-11-10 | Disposition: A | Discharge status: Home / Self Care | Payer: No Typology Code available for payment source | Source: Ambulatory Visit | Attending: Neurology | Admitting: Neurology

## 2018-11-10 ENCOUNTER — Other Ambulatory Visit: Payer: Self-pay

## 2018-11-10 DIAGNOSIS — Z01812 Encounter for preprocedural laboratory examination: Secondary | ICD-10-CM | POA: Diagnosis not present

## 2018-11-10 DIAGNOSIS — Z20828 Contact with and (suspected) exposure to other viral communicable diseases: Secondary | ICD-10-CM | POA: Diagnosis not present

## 2018-11-10 LAB — SARS CORONAVIRUS 2 (TAT 6-24 HRS): SARS Coronavirus 2: NEGATIVE

## 2018-11-14 ENCOUNTER — Other Ambulatory Visit: Payer: Self-pay

## 2018-11-14 ENCOUNTER — Ambulatory Visit (INDEPENDENT_AMBULATORY_CARE_PROVIDER_SITE_OTHER): Payer: No Typology Code available for payment source | Admitting: Neurology

## 2018-11-14 DIAGNOSIS — J4540 Moderate persistent asthma, uncomplicated: Secondary | ICD-10-CM

## 2018-11-14 DIAGNOSIS — G4734 Idiopathic sleep related nonobstructive alveolar hypoventilation: Secondary | ICD-10-CM

## 2018-11-14 DIAGNOSIS — K219 Gastro-esophageal reflux disease without esophagitis: Secondary | ICD-10-CM

## 2018-11-14 DIAGNOSIS — G4733 Obstructive sleep apnea (adult) (pediatric): Secondary | ICD-10-CM | POA: Diagnosis not present

## 2018-11-16 DIAGNOSIS — K219 Gastro-esophageal reflux disease without esophagitis: Secondary | ICD-10-CM | POA: Insufficient documentation

## 2018-11-16 NOTE — Progress Notes (Signed)
Severe Obstructive Sleep Apnea was seemingly  controlled under 16 cm water CPAP, few central apneas arose, and  the 02 nadir was normalized.    PLANS/RECOMMENDATIONS: High pressure in CPAP therapy can be  difficult to tolerate yet appears necessary to achieve AHI  control. The patient was fitted with a Medium Simplus FFM mask.  I suggest an auto titration capable device to be used, with a 2  cm EPR setting that mimics BiPAP modality. Settings of 7 cm  through 18 cm water with 2 cm EPR, under heated humidity.   1. CPAP therapy compliance is defined as 4 hours or more of  nightly use.  2. Any sleep apnea patient should avoid evening sedatives,  hypnotics, and alcoholic beverage consumption.  3. Reaching a lower body weight will also help needing lower  pressures of CPAP therapy.

## 2018-11-16 NOTE — Procedures (Signed)
PATIENT'S NAME:  Molly [name], Molly [name] DOB:      1980-05-28      MR#:    [id number]     DATE OF RECORDING: 11/14/2018 MR REFERRING M.D.:  Caryl Pina, MD Study Performed:   Titration to CPAP pressure  HISTORY: Patient returned for CPAP titration- Diagnostic Polysomnogram performed on 09/16/2018 revealed: Severest Obstructive Sleep Apnea (OSA), at AHI of 60.7/h, REM AHI was 90/h, and Apnea showed little exacerbation by position.   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 Warrick Parisian. Patient states that she wanted to be tested for possible sleep apnea, she has been told that she snores and the volume has worsened. She has gasped for breath, choked, has voiced GERD-reflux concerns. She complains of being tired during the day, she works odd/work hours, she uses melatonin to help with falling asleep and trazodone prn. 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 Morbid obesity, GERD, Asthma.   The patient endorsed the Epworth Sleepiness Scale at 4 points and the Fatigue Score at 39 points.   The patient's weight 269 pounds with a height of 66 (inches), resulting in a BMI of 43.2 kg/m2. The patient's neck circumference measured 15 inches.  CURRENT MEDICATIONS: Albuterol, Norvasc, Astelin, Vitamin C, Symbicort, Wellbutrin, Zyrtec, Elderberry, Flonase, Multivitamin, Prilosec, Zofran, Desyrel, Saxenda    PROCEDURE:  This is a multichannel digital polysomnogram utilizing the SomnoStar 11.2 system.  Electrodes and sensors were applied and monitored per AASM Specifications.   EEG, EOG, Chin and Limb EMG, were sampled at 200 Hz.  ECG, Snore and Nasal Pressure, Thermal Airflow, Respiratory Effort, CPAP Flow and Pressure, Oximetry was sampled at 50 Hz. Digital video and audio were recorded.     The patient was fitted with a Medium Simplus FFM mask. CPAP was initiated at 5 cmH20 with heated humidity per AASM split night  standards and pressure was advanced to 18 cmH20 because of hypopneas, apneas and desaturations.  18 cm water pressure was increasing the AHI over the best reached result under 16 cm water. At a PAP pressure of 16 cmH20, there was a reduction of the AHI to 1.8 with improvement of sleep apnea, oxygen nadir, and 33 minutes of recorded sleep. Simplus FFM in medium size.   Lights Out was at 21:56 and Lights On at 05:13. Total recording time (TRT) was 437.5 minutes, with a total sleep time (TST) of 385 minutes. The patient's sleep latency was 37.5 minutes. REM latency was 122 minutes.  The sleep efficiency was 88. %.    SLEEP ARCHITECTURE: WASO (Wake after sleep onset) was 21.5 minutes.  There were 16 minutes in Stage N1, 184 minutes Stage N2, 106.5 minutes Stage N3 and 78.5 minutes in Stage REM.  The percentage of Stage N1 was 4.2%, Stage N2 was 47.8%, Stage N3 was 27.7% and Stage R (REM sleep) was 20.4%.      RESPIRATORY ANALYSIS:  There were still 73 respiratory events: 34 obstructive apneas, 15 central apneas and 2 mixed apneas with a total of 51 apneas and an apnea index (AI) of 7.9 /hour. There were 22 hypopneas with a hypopnea index of 3.4/hour. The patient also had 0 respiratory event related arousals (RERAs).      The total APNEA/HYPOPNEA INDEX  (AHI) was 11.4 /hour and the total RESPIRATORY DISTURBANCE INDEX was 11.4 /hour  10 events occurred in REM sleep and 63 events in NREM. The  REM AHI was 7.6 /hour versus a non-REM AHI of 12.3 /hour.  The patient spent 205.5 minutes of total sleep time in the supine position and 180 minutes in non-supine. The supine AHI was 13.1, versus a non-supine AHI of 9.3.  OXYGEN SATURATION & C02:  The baseline 02 saturation was 96%, with the lowest being 75%. Time spent below 89% saturation equaled 12 minutes.  The arousals were noted as: 29 were spontaneous, 0 were associated with PLMs, and 35 were associated with respiratory events. The patient had a total of 0  Periodic Limb Movements.  Audio and video analysis did not show any abnormal or unusual movements, behaviors, phonations or vocalizations.  The patient didn't take bathroom breaks. Snoring was noted under pressures up to 14 cm water and controlled. EKG was in keeping with normal sinus rhythm (NSR).  DIAGNOSIS : Severe Obstructive Sleep Apnea was seemingly controlled under 16 cm water CPAP, few central apneas arose, and the 02 nadir was normalized.    PLANS/RECOMMENDATIONS: High pressure in CPAP therapy can be difficult to tolerate yet appears necessary to achieve AHI control. The patient was fitted with a Medium Simplus FFM mask. I suggest an auto titration capable device to be used, with a 2 cm EPR setting that mimics BiPAP modality. Settings of 7 cm through 18 cm water with 2 cm EPR, under heated humidity.   1. CPAP therapy compliance is defined as 4 hours or more of nightly use. 2. Any sleep apnea patient should avoid evening sedatives, hypnotics, and alcoholic beverage consumption.  3. Reaching a lower body weight will also help needing lower pressures of CPAP therapy.    DISCUSSION: High pressure in CPAP therapy can be difficult to tolerate yet appears necessary to achieve AHI control. I suggest an auto titration capable device to be used, with a 2 cm EPR setting that mimics BiPAP modality. Settings of 7 cm through 18 cm water with 2 cm EPR, under heated humidity.   A follow up appointment will be scheduled in the Sleep Clinic at [facility].   Please call [phone] with any questions.     I certify that I have reviewed the entire raw data recording prior to the issuance of this report in accordance with the Standards of Accreditation of the American Academy of Sleep Medicine (AASM)   Larey Seat, M.D.  11-16-2018 Diplomat, American Board of Psychiatry and Neurology  Diplomat, [place] [facility] Medical Director, [place] [facility] at [facility]

## 2018-11-16 NOTE — Addendum Note (Signed)
Addended by: Larey Seat on: 11/16/2018 09:17 AM   Modules accepted: Orders

## 2018-11-18 ENCOUNTER — Telehealth: Payer: Self-pay | Admitting: Neurology

## 2018-11-18 NOTE — Telephone Encounter (Signed)
Pt returned call.  I advised pt that Dr. Brett Fairy reviewed their sleep study results and found that pt was best treated with a pressure on CPAP . Dr. Brett Fairy recommends that pt starts Auto CPAP. I reviewed PAP compliance expectations with the pt. Pt is agreeable to starting a CPAP. I advised pt that an order will be sent to a DME, [facility], and [employer] [facility] will call the pt within about one week after they file with the pt's insurance. [employer] [facility] will show the pt how to use the machine, fit for masks, and troubleshoot the CPAP if needed. A follow up appt was made for insurance purposes with Debbora Presto, NP on Jan11,2020 at 8 am. Pt verbalized understanding to arrive 15 minutes early and bring their CPAP. A letter with all of this information in it will be mailed to the pt as a reminder. I verified with the pt that the address we have on file is correct. Pt verbalized understanding of results. Pt had no questions at this time but was encouraged to call back if questions arise. I have sent the order to [place] [facility] and have received confirmation that they have received the order.

## 2018-11-18 NOTE — Telephone Encounter (Signed)
Called patient to discuss sleep study results. No answer at this time. LVM for the patient to call back.   

## 2018-11-18 NOTE — Telephone Encounter (Signed)
-----   Message from Larey Seat, MD sent at 11/16/2018  9:16 AM EDT ----- Severe Obstructive Sleep Apnea was seemingly  controlled under 16 cm water CPAP, few central apneas arose, and  the 02 nadir was normalized.    PLANS/RECOMMENDATIONS: High pressure in CPAP therapy can be  difficult to tolerate yet appears necessary to achieve AHI  control. The patient was fitted with a Medium Simplus FFM mask.  I suggest an auto titration capable device to be used, with a 2  cm EPR setting that mimics BiPAP modality. Settings of 7 cm  through 18 cm water with 2 cm EPR, under heated humidity.   1. CPAP therapy compliance is defined as 4 hours or more of  nightly use.  2. Any sleep apnea patient should avoid evening sedatives,  hypnotics, and alcoholic beverage consumption.  3. Reaching a lower body weight will also help needing lower  pressures of CPAP therapy.

## 2018-12-15 ENCOUNTER — Other Ambulatory Visit: Payer: Self-pay | Admitting: Neurology

## 2018-12-15 ENCOUNTER — Encounter: Payer: Self-pay | Admitting: Neurology

## 2018-12-15 DIAGNOSIS — G4733 Obstructive sleep apnea (adult) (pediatric): Secondary | ICD-10-CM

## 2019-01-02 ENCOUNTER — Encounter (HOSPITAL_BASED_OUTPATIENT_CLINIC_OR_DEPARTMENT_OTHER): Payer: Self-pay | Admitting: Emergency Medicine

## 2019-01-02 ENCOUNTER — Emergency Department (HOSPITAL_BASED_OUTPATIENT_CLINIC_OR_DEPARTMENT_OTHER): Payer: No Typology Code available for payment source

## 2019-01-02 ENCOUNTER — Emergency Department (HOSPITAL_BASED_OUTPATIENT_CLINIC_OR_DEPARTMENT_OTHER)
Admission: EM | Admit: 2019-01-02 | Discharge: 2019-01-02 | Disposition: A | Discharge status: Home / Self Care | Payer: No Typology Code available for payment source | Attending: Emergency Medicine | Admitting: Emergency Medicine

## 2019-01-02 ENCOUNTER — Other Ambulatory Visit: Payer: Self-pay

## 2019-01-02 DIAGNOSIS — R1013 Epigastric pain: Secondary | ICD-10-CM | POA: Insufficient documentation

## 2019-01-02 DIAGNOSIS — Z87891 Personal history of nicotine dependence: Secondary | ICD-10-CM | POA: Diagnosis not present

## 2019-01-02 DIAGNOSIS — Z79899 Other long term (current) drug therapy: Secondary | ICD-10-CM | POA: Insufficient documentation

## 2019-01-02 DIAGNOSIS — I10 Essential (primary) hypertension: Secondary | ICD-10-CM | POA: Insufficient documentation

## 2019-01-02 HISTORY — DX: Sleep apnea, unspecified: G47.30

## 2019-01-02 HISTORY — DX: Essential (primary) hypertension: I10

## 2019-01-02 LAB — COMPREHENSIVE METABOLIC PANEL
ALT: 17 U/L (ref 0–44)
AST: 17 U/L (ref 15–41)
Albumin: 3.9 g/dL (ref 3.5–5.0)
Alkaline Phosphatase: 77 U/L (ref 38–126)
Anion gap: 7 (ref 5–15)
BUN: 10 mg/dL (ref 6–20)
CO2: 22 mmol/L (ref 22–32)
Calcium: 9.3 mg/dL (ref 8.9–10.3)
Chloride: 107 mmol/L (ref 98–111)
Creatinine, Ser: 0.69 mg/dL (ref 0.44–1.00)
GFR calc Af Amer: >60 mL/min (ref >60)
GFR calc non Af Amer: >60 mL/min (ref >60)
Glucose, Bld: 105 mg/dL — ABNORMAL HIGH (ref 70–99)
Potassium: 3.8 mmol/L (ref 3.5–5.1)
Sodium: 136 mmol/L (ref 135–145)
Total Bilirubin: 0.4 mg/dL (ref 0.3–1.2)
Total Protein: 7.7 g/dL (ref 6.5–8.1)

## 2019-01-02 LAB — CBC
HCT: 39 % (ref 36.0–46.0)
Hemoglobin: 12.3 g/dL (ref 12.0–15.0)
MCH: 24.9 pg — ABNORMAL LOW (ref 26.0–34.0)
MCHC: 31.5 g/dL (ref 30.0–36.0)
MCV: 79.1 fL — ABNORMAL LOW (ref 80.0–100.0)
Platelets: 447 10*3/uL — ABNORMAL HIGH (ref 150–400)
RBC: 4.93 MIL/uL (ref 3.87–5.11)
RDW: 14.3 % (ref 11.5–15.5)
WBC: 11.4 10*3/uL — ABNORMAL HIGH (ref 4.0–10.5)
nRBC: 0 % (ref 0.0–0.2)

## 2019-01-02 LAB — URINALYSIS, ROUTINE W REFLEX MICROSCOPIC
Bilirubin Urine: NEGATIVE
Glucose, UA: NEGATIVE mg/dL
Hgb urine dipstick: NEGATIVE
Ketones, ur: NEGATIVE mg/dL
Leukocytes,Ua: NEGATIVE
Nitrite: NEGATIVE
Protein, ur: NEGATIVE mg/dL
Specific Gravity, Urine: 1.015 (ref 1.005–1.030)
pH: 7.5 (ref 5.0–8.0)

## 2019-01-02 LAB — LIPASE, BLOOD: Lipase: 26 U/L (ref 11–51)

## 2019-01-02 LAB — PREGNANCY, URINE: Preg Test, Ur: NEGATIVE

## 2019-01-02 MED ORDER — FAMOTIDINE 20 MG PO TABS: 20.0000 mg | ORAL_TABLET | Freq: Two times a day (BID) | ORAL | 0 refills | Status: DC

## 2019-01-02 MED ORDER — IOHEXOL 300 MG/ML  SOLN
100.0000 mL | Freq: Once | INTRAMUSCULAR | Status: AC | PRN
  Administered 2019-01-02: 20:00:00 100 mL via INTRAVENOUS

## 2019-01-02 MED ORDER — ONDANSETRON HCL 4 MG/2ML IJ SOLN
4.0000 mg | Freq: Once | INTRAMUSCULAR | Status: AC
  Administered 2019-01-02: 19:00:00 4 mg via INTRAVENOUS
  Filled 2019-01-02: qty 2

## 2019-01-02 MED ORDER — SODIUM CHLORIDE 0.9 % IV BOLUS
1000.0000 mL | Freq: Once | INTRAVENOUS | Status: AC
  Administered 2019-01-02: 1000 mL via INTRAVENOUS

## 2019-01-02 MED ORDER — ALUM & MAG HYDROXIDE-SIMETH 200-200-20 MG/5ML PO SUSP
30.0000 mL | Freq: Once | ORAL | Status: AC
  Administered 2019-01-02: 30 mL via ORAL
  Filled 2019-01-02: qty 30

## 2019-01-02 MED ORDER — OMEPRAZOLE 20 MG PO CPDR: 20.0000 mg | DELAYED_RELEASE_CAPSULE | Freq: Two times a day (BID) | ORAL | 0 refills | Status: DC

## 2019-01-02 MED ORDER — LIDOCAINE VISCOUS HCL 2 % MT SOLN
15.0000 mL | Freq: Once | OROMUCOSAL | Status: AC
  Administered 2019-01-02: 19:00:00 15 mL via ORAL
  Filled 2019-01-02: qty 15

## 2019-01-02 NOTE — ED Notes (Signed)
Pt reports a little relief from pain after GI cocktail

## 2019-01-02 NOTE — ED Triage Notes (Signed)
Epigastric pain with N/V since getting off of work early this morning. She works in [employer].

## 2019-01-02 NOTE — ED Provider Notes (Signed)
[facility] EMERGENCY DEPARTMENT Provider Note   CSN: [id number] Arrival date & time: 01/02/19  1749     History Chief Complaint  Patient presents with  . Abdominal Pain    Molly [name] is a 38 y.o. female.  Patient is a 38 year old female with past medical history of sleep apnea, GERD, morbid obesity, hypertension, asthma presenting to the emergency department for epigastric pain.  Patient reports that she works overnight and when she was getting off shift at 7 AM this morning she began to feel what she described as epigastric pain which was similar to her GERD symptoms.  Reports that she then ate a bagel and then went to sleep.  Reports that the pain progressively got worse and she felt significantly bloated and nauseous.  Reports 20 episodes of vomiting.  Reports that this now feels different from her GERD symptoms.  Reports that she ended up taking 2 of her omeprazole pills instead of 1 and this did not seem to help.  Last bowel movement was this morning.  Denies any fever, chills, dysuria, diarrhea, melena.  Denies any fever or URI symptoms.        Past Medical History:  Diagnosis Date  . Anemia   . Colitis   . Colitis   . Hypertension   . Miscarriage   . Moderate persistent asthma, uncomplicated [id number]  . Recurrent upper respiratory infection (URI)   . Sleep apnea     Patient Active Problem List   Diagnosis Date Noted  . Gastroesophageal reflux disease without esophagitis 11/16/2018  . Severe obstructive sleep apnea-hypopnea syndrome 09/22/2018  . Chronic intermittent hypoxia with obstructive sleep apnea 09/22/2018  . Essential hypertension 03/02/2018  . Morbid obesity ([place]) 02/02/2018  . Moderate persistent asthma, uncomplicated [id number]  . Non-seasonal allergic rhinitis due to pollen 12/31/2017  . Generalized anxiety disorder 06/17/2017    Past Surgical History:  Procedure Laterality Date  . CESAREAN SECTION       OB History   No obstetric  history on file.     Family History  Problem Relation Age of Onset  . Diabetes Mother   . Hypertension Mother   . Hyperlipidemia Mother   . Allergic rhinitis Mother   . Diabetes Maternal Grandmother   . Heart disease Maternal Grandmother   . Cancer Maternal Grandmother        sarcoma in arm  . Kidney disease Maternal Grandmother   . Allergic rhinitis Maternal Grandmother   . Heart disease Maternal Grandfather        congestive heart failure  . Kidney disease Maternal Grandfather   . Cancer Maternal Grandfather        bladder  . Hypertension Maternal Grandfather   . Heart disease Paternal Grandmother   . Heart disease Paternal Grandfather   . COPD Paternal Grandfather   . Mental illness Paternal Grandfather        PTSD  . Asthma Neg Hx   . Eczema Neg Hx   . Urticaria Neg Hx     Social History   Tobacco Use  . Smoking status: Former Smoker    Types: Cigarettes    Quit date: 02/15/2011    Years since quitting: 7.8  . Smokeless tobacco: Never Used  Substance Use Topics  . Alcohol use: Yes    Alcohol/week: 1.0 standard drinks    Types: 1 Cans of beer per week    Comment: occasional  . Drug use: No    Home Medications Prior to Admission  medications   Medication Sig Start Date End Date Taking? Authorizing Provider  Albuterol Sulfate (PROAIR RESPICLICK) 123XX123 (90 Base) MCG/ACT AEPB Inhale 1 puff into the lungs every 6 (six) hours as needed. 07/29/18   Valentina Shaggy, MD  amLODipine (NORVASC) 5 MG tablet TAKE 1 TABLET BY MOUTH ONCE DAILY 07/16/18   Dettinger, Fransisca Kaufmann, MD  azelastine (ASTELIN) 0.1 % nasal spray Place 2 sprays into both nostrils daily. Use in each nostril as directed 01/02/18   Valentina Shaggy, MD  Bioflavonoid Products (VITAMIN C) CHEW Chew 2-3 tablets by mouth daily.    [provider]  budesonide-formoterol (SYMBICORT) 160-4.5 MCG/ACT inhaler Inhale 2 puffs into the lungs 2 (two) times daily. 07/29/18   Valentina Shaggy, MD    buPROPion (WELLBUTRIN XL) 150 MG 24 hr tablet TAKE 1 TABLET BY MOUTH DAILY. 09/18/18   Dettinger, Fransisca Kaufmann, MD  cetirizine (ZYRTEC) 10 MG tablet Take 10 mg by mouth daily as needed (seasonal allergies).    [provider]  ELDERBERRY PO Take 1 tablet by mouth daily.    [provider]  famotidine (PEPCID) 20 MG tablet Take 1 tablet (20 mg total) by mouth 2 (two) times daily. 01/02/19   Madilyn Hook A, PA-C  fluticasone (FLONASE) 50 MCG/ACT nasal spray Place 1 spray into both nostrils daily. 07/29/18   Valentina Shaggy, MD  levocetirizine (XYZAL) 5 MG tablet Take 1 tablet (5 mg total) by mouth every evening. 07/29/18   Valentina Shaggy, MD  Melatonin 5 MG TABS Take by mouth as needed.    [provider]  Multiple Vitamin (MULTIVITAMIN WITH MINERALS) TABS tablet Take 1 tablet by mouth daily.    [provider]  omeprazole (PRILOSEC) 20 MG capsule Take 1 capsule (20 mg total) by mouth 2 (two) times daily. 01/02/19 02/01/19  Madilyn Hook A, PA-C  ondansetron (ZOFRAN) 4 MG tablet Take 1 tablet (4 mg total) by mouth every 8 (eight) hours as needed for nausea or vomiting. 02/13/18   Hassell Done, Mary-Margaret, FNP  traZODone (DESYREL) 50 MG tablet Take 0.5-1 tablets (25-50 mg total) by mouth at bedtime as needed for sleep. 02/02/18   Dettinger, Fransisca Kaufmann, MD    Allergies    Other, Dust mite extract, Lavender oil, and Mold extract [trichophyton]  Review of Systems   Review of Systems  Constitutional: Positive for appetite change. Negative for chills, fatigue and fever.  HENT: Negative for congestion, sinus pressure, sinus pain and sore throat.   Respiratory: Negative for cough and shortness of breath.   Cardiovascular: Negative for chest pain.  Gastrointestinal: Positive for abdominal pain, nausea and vomiting. Negative for abdominal distention, anal bleeding, blood in stool, constipation, diarrhea and rectal pain.  Genitourinary: Negative for decreased urine  volume, dysuria, flank pain, pelvic pain, urgency and vaginal discharge.  Musculoskeletal: Negative for back pain.  Skin: Negative for rash.  Allergic/Immunologic: Negative for immunocompromised state.  Neurological: Negative for dizziness, light-headedness and headaches.  Hematological: Does not bruise/bleed easily.    Physical Exam Updated Vital Signs BP 116/71 (BP Location: Right Arm)   Pulse 86   Temp 98.5 F (36.9 C) (Oral)   Resp 20   Ht 5\' 6"  (1.676 m)   Wt 124.7 kg   LMP 12/21/2018   SpO2 100%   BMI 44.39 kg/m   Physical Exam Vitals and nursing note reviewed.  Constitutional:      Appearance: Normal appearance.  HENT:     Head: Normocephalic.  Eyes:  Conjunctiva/sclera: Conjunctivae normal.  Cardiovascular:     Rate and Rhythm: Normal rate and regular rhythm.  Pulmonary:     Effort: Pulmonary effort is normal.     Breath sounds: Normal breath sounds.  Abdominal:     General: Bowel sounds are normal. There is distension (mild).     Palpations: Abdomen is soft.     Tenderness: There is abdominal tenderness (mild) in the epigastric area. There is no left CVA tenderness, guarding or rebound. Negative signs include Murphy's sign and McBurney's sign.  Skin:    General: Skin is dry.  Neurological:     Mental Status: She is alert.  Psychiatric:        Mood and Affect: Mood normal.     ED Results / Procedures / Treatments   Labs (all labs ordered are listed, but only abnormal results are displayed) Labs Reviewed  COMPREHENSIVE METABOLIC PANEL - Abnormal; Notable for the following components:      Result Value   Glucose, Bld 105 (*)    All other components within normal limits  CBC - Abnormal; Notable for the following components:   WBC 11.4 (*)    MCV 79.1 (*)    MCH 24.9 (*)    Platelets 447 (*)    All other components within normal limits  LIPASE, BLOOD  URINALYSIS, ROUTINE W REFLEX MICROSCOPIC  PREGNANCY, URINE    EKG None  Radiology CT  ABDOMEN PELVIS W CONTRAST  Result Date: 01/02/2019 CLINICAL DATA:  Abdominal pain, vomiting EXAM: CT ABDOMEN AND PELVIS WITH CONTRAST TECHNIQUE: Multidetector CT imaging of the abdomen and pelvis was performed using the standard protocol following bolus administration of intravenous contrast. CONTRAST:  146mL OMNIPAQUE IOHEXOL 300 MG/ML  SOLN COMPARISON:  None. FINDINGS: Lower chest: No acute abnormality. Hepatobiliary: No solid liver abnormality is seen. Hepatic steatosis. No gallstones, gallbladder wall thickening, or biliary dilatation. Pancreas: Unremarkable. No pancreatic ductal dilatation or surrounding inflammatory changes. Spleen: Normal in size without significant abnormality. Punctuate calcifications in keeping with prior granulomatous infection. Adrenals/Urinary Tract: Adrenal glands are unremarkable. Kidneys are normal, without renal calculi, solid lesion, or hydronephrosis. Bladder is unremarkable. Stomach/Bowel: Stomach is within normal limits. Appendix appears normal. No evidence of bowel wall thickening, distention, or inflammatory changes. Vascular/Lymphatic: No significant vascular findings are present. No enlarged abdominal or pelvic lymph nodes. Reproductive: No mass or other significant abnormality. Small fluid attenuation cysts or follicles of the ovaries. Other: Small fat containing umbilical hernia. No abdominopelvic ascites. Musculoskeletal: No acute or significant osseous findings. IMPRESSION: 1. No acute CT findings of the abdomen or pelvis to explain abdominal pain or vomiting. 2.  Hepatic steatosis. Electronically Signed   By: Eddie Candle M.D.   On: 01/02/2019 20:13   US Abdomen Limited RUQ  Result Date: 01/02/2019 CLINICAL DATA:  Nausea vomiting and bloating EXAM: ULTRASOUND ABDOMEN LIMITED RIGHT UPPER QUADRANT COMPARISON:  None. FINDINGS: Gallbladder: No gallstones or wall thickening visualized. No sonographic Murphy sign noted by sonographer. The gallbladder was not fully  evaluated on this exam secondary to patient body habitus and poor sonographic windows. Common bile duct: Diameter: 3 mm Liver: Diffuse increased echogenicity with slightly heterogeneous liver. Appearance typically secondary to fatty infiltration. Fibrosis secondary consideration. No secondary findings of cirrhosis noted. No focal hepatic lesion or intrahepatic biliary duct dilatation. Portal vein is patent on color Doppler imaging with normal direction of blood flow towards the liver. Other: None. IMPRESSION: 1. Limited study as detailed above. 2. Given the above limitations, no acute sonographic  abnormality was detected. There is no sonographic evidence for cholelithiasis. 3. Hepatic steatosis. Electronically Signed   By: Constance Holster M.D.   On: 01/02/2019 18:49    Procedures Procedures (including critical care time)  Medications Ordered in ED Medications  alum & mag hydroxide-simeth (MAALOX/MYLANTA) 200-200-20 MG/5ML suspension 30 mL (30 mLs Oral Given 01/02/19 1852)    And  lidocaine (XYLOCAINE) 2 % viscous mouth solution 15 mL (15 mLs Oral Given 01/02/19 1852)  ondansetron (ZOFRAN) injection 4 mg (4 mg Intravenous Given 01/02/19 1846)  sodium chloride 0.9 % bolus 1,000 mL (1,000 mLs Intravenous New Bag/Given 01/02/19 1848)  iohexol (OMNIPAQUE) 300 MG/ML solution 100 mL (100 mLs Intravenous Contrast Given 01/02/19 1950)    ED Course  I have reviewed the triage vital signs and the nursing notes.  Pertinent labs & imaging results that were available during my care of the patient were reviewed by me and considered in my medical decision making (see chart for details).  Clinical Course as of Jan 01 2037  Sat Jan 02, 2019  2029 Patient presenting with abdominal pain this morning at 7 AM with distention.  Has history of GERD.  Work-up revealing a white count of 11.4 but otherwise unremarkable.  Normal lipase.  Urinalysis without signs of infection.  Gallbladder ultrasound showing hepatic  steatosis.  CT scan unremarkable.  Vitals are stable patient improved with GI cocktail and fluids.  Likely GERD flareup.  Patient has not seen GI in years and we will refer her today to the GI specialist.  Advised on dietary changes.   [KM]    Clinical Course User Index [KM] Kristine Royal   MDM Rules/Calculators/A&P     CHA2DS2/VAS Stroke Risk Points      N/A >= 2 Points: High Risk  1 - 1.99 Points: Medium Risk  0 Points: Low Risk    A final score could not be computed because of missing components.: Last  Change: N/A     This score determines the patient's risk of having a stroke if the  patient has atrial fibrillation.      This score is not applicable to this patient. Components are not  calculated.                   Based on review of vitals, medical screening exam, lab work and/or imaging, there does not appear to be an acute, emergent etiology for the patient's symptoms. Counseled pt on good return precautions and encouraged both PCP and ED follow-up as needed.  Prior to discharge, I also discussed incidental imaging findings with patient in detail and advised appropriate, recommended follow-up in detail.  Clinical Impression: 1. Epigastric pain     Disposition: Discharge  Prior to providing a prescription for a controlled substance, I independently reviewed the patient's recent prescription history on the [place]. The patient had no recent or regular prescriptions and was deemed appropriate for a brief, less than 3 day prescription of narcotic for acute analgesia.  This note was prepared with assistance of Systems analyst. Occasional wrong-word or sound-a-like substitutions may have occurred due to the inherent limitations of voice recognition software.  Final Clinical Impression(s) / ED Diagnoses Final diagnoses:  Epigastric pain    Rx / DC Orders ED Discharge Orders         Ordered    famotidine  (PEPCID) 20 MG tablet  2 times daily     01/02/19 2032  omeprazole (PRILOSEC) 20 MG capsule  2 times daily     01/02/19 2032           Kristine Royal 01/02/19 2038    Noemi Chapel, MD 01/03/19 2256

## 2019-01-02 NOTE — ED Notes (Signed)
Pt in [place]

## 2019-01-02 NOTE — Discharge Instructions (Addendum)
You are seen today for epigastric pain with nausea and vomiting.  Your lab work was reassuring.  Your imaging studies did not show any acute intra-abdominal process but it did show that you have signs of hepatic steatosis.  I am going to refer you to a new GI specialist to follow-up with.  In the meantime please increase your omeprazole to 20 mg twice a day and have also added Pepcid 20 mg twice a day for your symptoms. Thank you for allowing me to care for you today. Please return to the emergency department if you have new or worsening symptoms. Take your medications as instructed.

## 2019-01-20 MED FILL — LEVOCETIRIZINE 5 MG TABLET: 5 | 30 days supply | Qty: 30 | Fill #3

## 2019-01-20 MED FILL — FLUTICASONE PROP 50 MCG SPR: 50 | 60 days supply | Qty: 16 | Fill #0

## 2019-01-27 ENCOUNTER — Ambulatory Visit: Payer: No Typology Code available for payment source | Admitting: Allergy & Immunology

## 2019-02-01 ENCOUNTER — Ambulatory Visit: Payer: No Typology Code available for payment source | Admitting: Family Medicine

## 2019-02-01 ENCOUNTER — Telehealth: Payer: Self-pay

## 2019-02-01 ENCOUNTER — Encounter: Payer: Self-pay | Admitting: Family Medicine

## 2019-02-01 ENCOUNTER — Other Ambulatory Visit: Payer: Self-pay

## 2019-02-01 VITALS — BP 126/84 | HR 100 | Temp 97.4°F | Ht 66.0 in | Wt 292.4 lb

## 2019-02-01 DIAGNOSIS — G4733 Obstructive sleep apnea (adult) (pediatric): Secondary | ICD-10-CM | POA: Diagnosis not present

## 2019-02-01 DIAGNOSIS — Z9989 Dependence on other enabling machines and devices: Secondary | ICD-10-CM

## 2019-02-01 NOTE — Telephone Encounter (Signed)
Received confirmation via Darlina Guys via [employer].

## 2019-02-01 NOTE — Telephone Encounter (Signed)
Sent a fax to [facility] a CPAP order per NP Amy Lomax.   Awaiting confirmation. (sent Via fax and [employer] as a precaution)

## 2019-02-01 NOTE — Progress Notes (Signed)
PATIENT: Molly [name] DOB: 05-12-1980  REASON FOR VISIT: follow up HISTORY FROM: patient  Chief Complaint  Patient presents with  . Follow-up    RM5. alone. Pt states that her reflux seems to have worsened since utalizing the CPAP.     HISTORY OF PRESENT ILLNESS: Today 02/01/19 Molly [name] is a 39 y.o. female here today for follow up for severe OSA with hypoxemia on CPAP.  Molly [name] is adjusting to CPAP therapy at home.  She does continue to have concerns of excessive bloating, abdominal pain and reflux.  She feels that the symptoms have worsened since starting CPAP.  We decreased set pressure from 16 cm of water to 15 cm of water.  She does report that this helped somewhat.  She also tried nasal pillows with chinstrap but did not feel that that was beneficial.  She is interested in trying a traditional full facemask.  She is taking Pepcid and Prilosec for control of reflux.  She is sleeping in an elevated position.  She is working swing shifts.  She is working with her primary care provider to initiate work-up for bariatric surgery.  Compliance report dated 12/30/2018 through 01/28/2019 reveals that she used CPAP 30 of the last 30 days for compliance of 100%.  25 of those days she used CPAP greater than 4 hours for compliance of 83%.  Average usage was 7 hours.  Residual AHI was 1.6 on 15 cm of water and an EPR of 3.  There was no significant leak noted.   HISTORY: (copied from Dr Dohmeier's note on 03/17/2018)  HPI:  Molly [name] is a 39 y.o. female patient, seen on 2-25-20202 in  referral  from Dr. Warrick Parisian for sleep apnea.   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  The patient was diagnosed with asthma, had recurrent bronchitis.    Chief complaint according to patient :" I need a sleep study".   Sleep habits are as follows: He does not always dinnertime is usually around 7 PM, the patient  retreats to bed by about 1030 on average.  She will no longer consume red wine for dinner as she had reflux and she keeps as time of at least 2 hours between meal and bedtime. She takes melatonin nightly and her usual average sleep latency is less than 30 minutes, In a cool, quiet and dark bedroom.  She prefers to sleep on her side and on one pillow, with another between the legs.  The bed is not adjusted in hight. Nocturia times one. Can't remember her dreams.  Rises at 9 AM on week days, and working through the weekends , returning at 5 AM and she will rise at 12 noon.   Sleep medical history: morbid obesity- may be hiatal hernia, GERD, asthma.   Family sleep history: mother may have asthma, has OSA, has rhinitis, and sinusitis.    Social history:  Friday, Saturday and Sunday works until 4 AM. No exercise possibility, caretaker duties consume all time.  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  Former smoker quit Jan 25th 2013.  Alcohol - yes, but not red wine or spirits.    REVIEW OF SYSTEMS: Out of a complete 14 system review of symptoms, the patient complains only of the following symptoms, acid reflux and all other reviewed systems are negative.  Epworth sleepiness scale: 5  Fatigue severity scale: 36  ALLERGIES: Allergies  Allergen Reactions  . Other Anaphylaxis    Reaction to hay (something in [place] pig's cage), dog and mice  . Dust Mite Extract   . Lavender Oil Hives  . Mold Extract [Trichophyton]     HOME MEDICATIONS: Outpatient Medications Prior to Visit  Medication Sig Dispense Refill  . Albuterol Sulfate (PROAIR RESPICLICK) [id number] (90 Base) MCG/ACT AEPB Inhale 1 puff into the lungs every 6 (six) hours as needed. 1 each 1  . amLODipine (NORVASC) 5 MG tablet TAKE 1 TABLET BY MOUTH ONCE DAILY 90 tablet 1  . azelastine (ASTELIN) 0.1 % nasal spray Place 2 sprays into both nostrils daily. Use in each nostril as  directed 30 mL 3  . Bioflavonoid Products (VITAMIN C) CHEW Chew 2-3 tablets by mouth daily.    . budesonide-formoterol (SYMBICORT) 160-4.5 MCG/ACT inhaler Inhale 2 puffs into the lungs 2 (two) times daily. 1 Inhaler 5  . buPROPion (WELLBUTRIN XL) 150 MG 24 hr tablet TAKE 1 TABLET BY MOUTH DAILY. 90 tablet 0  . ELDERBERRY PO Take 1 tablet by mouth daily.    . famotidine (PEPCID) 20 MG tablet Take 1 tablet (20 mg total) by mouth 2 (two) times daily. 30 tablet 0  . fluticasone (FLONASE) 50 MCG/ACT nasal spray Place 1 spray into both nostrils daily. 16 g 3  . levocetirizine (XYZAL) 5 MG tablet Take 1 tablet (5 mg total) by mouth every evening. 30 tablet 5  . Multiple Vitamin (MULTIVITAMIN WITH MINERALS) TABS tablet Take 1 tablet by mouth daily.    Marland Kitchen omeprazole (PRILOSEC) 20 MG capsule Take 1 capsule (20 mg total) by mouth 2 (two) times daily. 60 capsule 0  . traZODone (DESYREL) 50 MG tablet Take 0.5-1 tablets (25-50 mg total) by mouth at bedtime as needed for sleep. 90 tablet 3  . cetirizine (ZYRTEC) 10 MG tablet Take 10 mg by mouth daily as needed (seasonal allergies).    . Melatonin 5 MG TABS Take by mouth as needed.    . ondansetron (ZOFRAN) 4 MG tablet Take 1 tablet (4 mg total) by mouth every 8 (eight) hours as needed for nausea or vomiting. (Patient not taking: Reported on 02/01/2019) 20 tablet 0   No facility-administered medications prior to visit.    PAST MEDICAL HISTORY: Past Medical History:  Diagnosis Date  . Anemia   . Colitis   . Colitis   . Hypertension   . Miscarriage   . Moderate persistent asthma, uncomplicated [id number]  . Recurrent upper respiratory infection (URI)   . Sleep apnea     PAST SURGICAL HISTORY: Past Surgical History:  Procedure Laterality Date  . CESAREAN SECTION      FAMILY HISTORY: Family History  Problem Relation Age of Onset  . Diabetes Mother   . Hypertension Mother   . Hyperlipidemia Mother   . Allergic rhinitis Mother   . Diabetes  Maternal Grandmother   . Heart disease Maternal Grandmother   . Cancer Maternal Grandmother        sarcoma in arm  . Kidney disease Maternal Grandmother   . Allergic rhinitis Maternal Grandmother   . Heart disease Maternal Grandfather        congestive heart failure  . Kidney disease Maternal Grandfather   . Cancer Maternal Grandfather        bladder  . Hypertension Maternal Grandfather   . Heart disease Paternal Grandmother   . Heart disease Paternal Grandfather   . COPD Paternal Grandfather   .  Mental illness Paternal Grandfather        PTSD  . Asthma Neg Hx   . Eczema Neg Hx   . Urticaria Neg Hx     SOCIAL HISTORY: Social History   Socioeconomic History  . Marital status: Married    Spouse name: Not on file  . Number of children: Not on file  . Years of education: Not on file  . Highest education level: Not on file  Occupational History  . Not on file  Tobacco Use  . Smoking status: Former Smoker    Types: Cigarettes    Quit date: 02/15/2011    Years since quitting: 7.9  . Smokeless tobacco: Never Used  Substance and Sexual Activity  . Alcohol use: Yes    Alcohol/week: 1.0 standard drinks    Types: 1 Cans of beer per week    Comment: occasional  . Drug use: No  . Sexual activity: Yes    Birth control/protection: None    Comment: married 6 years, trying to get pregnant  Other Topics Concern  . Not on file  Social History Narrative  . Not on file   Social Determinants of Health   Financial Resource Strain:   . Difficulty of Paying Living Expenses: Not on file  Food Insecurity:   . Worried About Charity fundraiser in the Last Year: Not on file  . Ran Out of Food in the Last Year: Not on file  Transportation Needs:   . Lack of Transportation (Medical): Not on file  . Lack of Transportation (Non-Medical): Not on file  Physical Activity:   . Days of Exercise per Week: Not on file  . Minutes of Exercise per Session: Not on file  Stress:   . Feeling of  Stress : Not on file  Social Connections:   . Frequency of Communication with Friends and Family: Not on file  . Frequency of Social Gatherings with Friends and Family: Not on file  . Attends Religious Services: Not on file  . Active Member of Clubs or Organizations: Not on file  . Attends Archivist Meetings: Not on file  . Marital Status: Not on file  Intimate Partner Violence:   . Fear of Current or Ex-Partner: Not on file  . Emotionally Abused: Not on file  . Physically Abused: Not on file  . Sexually Abused: Not on file      PHYSICAL EXAM  Vitals:   02/01/19 0803  BP: 126/84  Pulse: 100  Temp: (!) 97.4 F (36.3 C)  Weight: 292 lb 6.4 oz (132.6 kg)  Height: 5\' 6"  (1.676 m)   Body mass index is 47.19 kg/m.  Generalized: Well developed, in no acute distress  Cardiology: normal rate and rhythm, no murmur noted Respiratory: Clear to auscultation bilaterally Neurological examination  Mentation: Alert oriented to time, place, history taking. Follows all commands speech and language fluent Cranial nerve II-XII: Pupils were equal round reactive to light. Extraocular movements were full, visual field were full  Motor: The motor testing reveals 5 over 5 strength of all 4 extremities. Good symmetric motor tone is noted throughout.  Gait and station: Gait is normal.   DIAGNOSTIC DATA (LABS, IMAGING, TESTING) - I reviewed patient records, labs, notes, testing and imaging myself where available.  No flowsheet data found.   Lab Results  Component Value Date   WBC 11.4 (H) 01/02/2019   HGB 12.3 01/02/2019   HCT 39.0 01/02/2019   MCV 79.1 (L) 01/02/2019  PLT 447 (H) 01/02/2019      Component Value Date/Time   NA 136 01/02/2019 1849   NA 140 02/02/2018 1438   K 3.8 01/02/2019 1849   CL 107 01/02/2019 1849   CO2 22 01/02/2019 1849   GLUCOSE 105 (H) 01/02/2019 1849   BUN 10 01/02/2019 1849   BUN 7 02/02/2018 1438   CREATININE 0.69 01/02/2019 1849   CALCIUM  9.3 01/02/2019 1849   PROT 7.7 01/02/2019 1849   PROT 6.9 02/02/2018 1438   ALBUMIN 3.9 01/02/2019 1849   ALBUMIN 4.1 02/02/2018 1438   AST 17 01/02/2019 1849   ALT 17 01/02/2019 1849   ALKPHOS 77 01/02/2019 1849   BILITOT 0.4 01/02/2019 1849   BILITOT <0.2 02/02/2018 1438   GFRNONAA >60 01/02/2019 1849   GFRAA >60 01/02/2019 1849   Lab Results  Component Value Date   CHOL 125 02/02/2018   HDL 46 02/02/2018   LDLCALC 60 02/02/2018   TRIG 94 02/02/2018   CHOLHDL 2.7 02/02/2018   No results found for: HGBA1C No results found for: VITAMINB12 Lab Results  Component Value Date   TSH 2.470 01/29/2017       ASSESSMENT AND PLAN 39 y.o. year old female  has a past medical history of Anemia, Colitis, Colitis, Hypertension, Miscarriage, Moderate persistent asthma, uncomplicated ([id number]), Recurrent upper respiratory infection (URI), and Sleep apnea. here with     ICD-10-CM   1. OSA on CPAP  G47.33 For home use only DME continuous positive airway pressure (CPAP)   Z99.89 For home use only DME continuous positive airway pressure (CPAP)    Elexys continues to adjust to CPAP therapy at home.  She does note significant improvement in sleep quality and has noticed increased energy levels after starting CPAP.  She continues to struggle with abdominal distention and acid reflux.  She is working with primary care for acid reflux and weight management.  We will decrease set pressure to 14 cm of water as residual AHI is well managed and she noted benefit with last pressure decrease.  I will also encourage her to consider a traditional full facemask.  She will continue to elevated position.  She will monitor for food triggers.  We will repeat compliance download in 6 weeks for review.  If she is doing well we will follow-up in 1 year.  We will adjust follow-up pending compliance review.  She verbalizes understanding and agreement with this plan.   Orders Placed This Encounter  Procedures  .  For home use only DME continuous positive airway pressure (CPAP)    Patient would like to try traditional full face mask    Order Specific Question:   Length of Need    Answer:   Lifetime    Order Specific Question:   Patient has OSA or probable OSA    Answer:   Yes    Order Specific Question:   Is the patient currently using CPAP in the home    Answer:   Yes    Order Specific Question:   Settings    Answer:   Other see comments    Order Specific Question:   CPAP supplies needed    Answer:   Mask, headgear, cushions, filters, heated tubing and water chamber  . For home use only DME continuous positive airway pressure (CPAP)    Decrease set pressure from 15cmH20 to 14cmH20 please    Order Specific Question:   Length of Need    Answer:   Lifetime  Order Specific Question:   Patient has OSA or probable OSA    Answer:   Yes    Order Specific Question:   Is the patient currently using CPAP in the home    Answer:   Yes    Order Specific Question:   Settings    Answer:   Other see comments    Order Specific Question:   CPAP supplies needed    Answer:   Mask, headgear, cushions, filters, heated tubing and water chamber     No orders of the defined types were placed in this encounter.     I spent 15 minutes with the patient. 50% of this time was spent counseling and educating patient on plan of care and medications.    Debbora Presto, FNP-C 02/01/2019, 8:46 AM [facility] [address] [place], [place] [address] [phone]

## 2019-02-01 NOTE — Patient Instructions (Signed)
Continue CPAP nightly and greater than 4 hours each night   Follow up in 1 year, sooner if needed   CPAP and BPAP Information CPAP and BPAP are methods of helping a person breathe with the use of air pressure. CPAP stands for "continuous positive airway pressure." BPAP stands for "bi-level positive airway pressure." In both methods, air is blown through your nose or mouth and into your air passages to help you breathe well. CPAP and BPAP use different amounts of pressure to blow air. With CPAP, the amount of pressure stays the same while you breathe in and out. With BPAP, the amount of pressure is increased when you breathe in (inhale) so that you can take larger breaths. Your health care provider will recommend whether CPAP or BPAP would be more helpful for you. Why are CPAP and BPAP treatments used? CPAP or BPAP can be helpful if you have:  Sleep apnea.  Chronic obstructive pulmonary disease (COPD).  Heart failure.  Medical conditions that weaken the muscles of the chest including muscular dystrophy, or neurological diseases such as amyotrophic lateral sclerosis (ALS).  Other problems that cause breathing to be weak, abnormal, or difficult. CPAP is most commonly used for obstructive sleep apnea (OSA) to keep the airways from collapsing when the muscles relax during sleep. How is CPAP or BPAP administered? Both CPAP and BPAP are provided by a small machine with a flexible plastic tube that attaches to a plastic mask. You wear the mask. Air is blown through the mask into your nose or mouth. The amount of pressure that is used to blow the air can be adjusted on the machine. Your health care provider will determine the pressure setting that should be used based on your individual needs. When should CPAP or BPAP be used? In most cases, the mask only needs to be worn during sleep. Generally, the mask needs to be worn throughout the night and during any daytime naps. People with certain medical  conditions may also need to wear the mask at other times when they are awake. Follow instructions from your health care provider about when to use the machine. What are some tips for using the mask?   Because the mask needs to be snug, some people feel trapped or closed-in (claustrophobic) when first using the mask. If you feel this way, you may need to get used to the mask. One way to do this is by holding the mask loosely over your nose or mouth and then gradually applying the mask more snugly. You can also gradually increase the amount of time that you use the mask.  Masks are available in various types and sizes. Some fit over your mouth and nose while others fit over just your nose. If your mask does not fit well, talk with your health care provider about getting a different one.  If you are using a mask that fits over your nose and you tend to breathe through your mouth, a chin strap may be applied to help keep your mouth closed.  The CPAP and BPAP machines have alarms that may sound if the mask comes off or develops a leak.  If you have trouble with the mask, it is very important that you talk with your health care provider about finding a way to make the mask easier to tolerate. Do not stop using the mask. Stopping the use of the mask could have a negative impact on your health. What are some tips for using the machine?  Place your CPAP or BPAP machine on a secure table or stand near an electrical outlet.  Know where the on/off switch is located on the machine.  Follow instructions from your health care provider about how to set the pressure on your machine and when you should use it.  Do not eat or drink while the CPAP or BPAP machine is on. Food or fluids could get pushed into your lungs by the pressure of the CPAP or BPAP.  Do not smoke. Tobacco smoke residue can damage the machine.  For home use, CPAP and BPAP machines can be rented or purchased through home health care  companies. Many different brands of machines are available. Renting a machine before purchasing may help you find out which particular machine works well for you.  Keep the CPAP or BPAP machine and attachments clean. Ask your health care provider for specific instructions. Get help right away if:  You have redness or open areas around your nose or mouth where the mask fits.  You have trouble using the CPAP or BPAP machine.  You cannot tolerate wearing the CPAP or BPAP mask.  You have pain, discomfort, and bloating in your abdomen. Summary  CPAP and BPAP are methods of helping a person breathe with the use of air pressure.  Both CPAP and BPAP are provided by a small machine with a flexible plastic tube that attaches to a plastic mask.  If you have trouble with the mask, it is very important that you talk with your health care provider about finding a way to make the mask easier to tolerate. This information is not intended to replace advice given to you by your health care provider. Make sure you discuss any questions you have with your health care provider. Document Revised: 04/29/2018 Document Reviewed: 11/27/2015 Elsevier Patient Education  [place].   Sleep Apnea Sleep apnea affects breathing during sleep. It causes breathing to stop for a short time or to become shallow. It can also increase the risk of:  Heart attack.  Stroke.  Being very overweight (obese).  Diabetes.  Heart failure.  Irregular heartbeat. The goal of treatment is to help you breathe normally again. What are the causes? There are three kinds of sleep apnea:  Obstructive sleep apnea. This is caused by a blocked or collapsed airway.  Central sleep apnea. This happens when the brain does not send the right signals to the muscles that control breathing.  Mixed sleep apnea. This is a combination of obstructive and central sleep apnea. The most common cause of this condition is a collapsed or  blocked airway. This can happen if:  Your throat muscles are too relaxed.  Your tongue and tonsils are too large.  You are overweight.  Your airway is too small. What increases the risk?  Being overweight.  Smoking.  Having a small airway.  Being older.  Being female.  Drinking alcohol.  Taking medicines to calm yourself (sedatives or tranquilizers).  Having family members with the condition. What are the signs or symptoms?  Trouble staying asleep.  Being sleepy or tired during the day.  Getting angry a lot.  Loud snoring.  Headaches in the morning.  Not being able to focus your mind (concentrate).  Forgetting things.  Less interest in sex.  Mood swings.  Personality changes.  Feelings of sadness (depression).  Waking up a lot during the night to pee (urinate).  Dry mouth.  Sore throat. How is this diagnosed?  Your medical  history.  A physical exam.  A test that is done when you are sleeping (sleep study). The test is most often done in a sleep lab but may also be done at home. How is this treated?   Sleeping on your side.  Using a medicine to get rid of mucus in your nose (decongestant).  Avoiding the use of alcohol, medicines to help you relax, or certain pain medicines (narcotics).  Losing weight, if needed.  Changing your diet.  Not smoking.  Using a machine to open your airway while you sleep, such as: ? An oral appliance. This is a mouthpiece that shifts your lower jaw forward. ? A CPAP device. This device blows air through a mask when you breathe out (exhale). ? An EPAP device. This has valves that you put in each nostril. ? A BPAP device. This device blows air through a mask when you breathe in (inhale) and breathe out.  Having surgery if other treatments do not work. It is important to get treatment for sleep apnea. Without treatment, it can lead to:  High blood pressure.  Coronary artery disease.  In men, not being able  to have an erection (impotence).  Reduced thinking ability. Follow these instructions at home: Lifestyle  Make changes that your doctor recommends.  Eat a healthy diet.  Lose weight if needed.  Avoid alcohol, medicines to help you relax, and some pain medicines.  Do not use any products that contain nicotine or tobacco, such as cigarettes, e-cigarettes, and chewing tobacco. If you need help quitting, ask your doctor. General instructions  Take over-the-counter and prescription medicines only as told by your doctor.  If you were given a machine to use while you sleep, use it only as told by your doctor.  If you are having surgery, make sure to tell your doctor you have sleep apnea. You may need to bring your device with you.  Keep all follow-up visits as told by your doctor. This is important. Contact a doctor if:  The machine that you were given to use during sleep bothers you or does not seem to be working.  You do not get better.  You get worse. Get help right away if:  Your chest hurts.  You have trouble breathing in enough air.  You have an uncomfortable feeling in your back, arms, or stomach.  You have trouble talking.  One side of your body feels weak.  A part of your face is hanging down. These symptoms may be an emergency. Do not wait to see if the symptoms will go away. Get medical help right away. Call your local emergency services (911 in the [place] Do not drive yourself to the hospital. Summary  This condition affects breathing during sleep.  The most common cause is a collapsed or blocked airway.  The goal of treatment is to help you breathe normally while you sleep. This information is not intended to replace advice given to you by your health care provider. Make sure you discuss any questions you have with your health care provider. Document Revised: 10/24/2017 Document Reviewed: 09/02/2017 Elsevier Patient Education  [place].

## 2019-02-03 ENCOUNTER — Ambulatory Visit: Payer: No Typology Code available for payment source | Admitting: Allergy & Immunology

## 2019-02-04 ENCOUNTER — Encounter: Payer: Self-pay | Admitting: Family Medicine

## 2019-02-04 DIAGNOSIS — K219 Gastro-esophageal reflux disease without esophagitis: Secondary | ICD-10-CM

## 2019-02-09 ENCOUNTER — Encounter: Payer: Self-pay | Admitting: Internal Medicine

## 2019-02-17 ENCOUNTER — Other Ambulatory Visit: Payer: Self-pay | Admitting: Family Medicine

## 2019-02-17 DIAGNOSIS — I10 Essential (primary) hypertension: Secondary | ICD-10-CM

## 2019-02-17 MED FILL — LEVOCETIRIZINE 5 MG TABLET: 5 | 30 days supply | Qty: 30 | Fill #4

## 2019-02-17 MED FILL — AMLODIPINE BESYLATE 5 MG TA: 5 | 90 days supply | Qty: 90 | Fill #0

## 2019-02-17 MED FILL — OMEPRAZOLE 20 MG CAP: 20 | 90 days supply | Qty: 90 | Fill #1

## 2019-02-24 ENCOUNTER — Encounter: Payer: Self-pay | Admitting: Allergy & Immunology

## 2019-02-24 ENCOUNTER — Other Ambulatory Visit: Payer: Self-pay

## 2019-02-24 ENCOUNTER — Ambulatory Visit (INDEPENDENT_AMBULATORY_CARE_PROVIDER_SITE_OTHER): Payer: No Typology Code available for payment source | Admitting: Allergy & Immunology

## 2019-02-24 VITALS — BP 130/74 | HR 90 | Temp 97.9°F | Resp 20 | Ht 67.0 in | Wt 287.6 lb

## 2019-02-24 DIAGNOSIS — J3089 Other allergic rhinitis: Secondary | ICD-10-CM | POA: Diagnosis not present

## 2019-02-24 DIAGNOSIS — J4540 Moderate persistent asthma, uncomplicated: Secondary | ICD-10-CM | POA: Diagnosis not present

## 2019-02-24 DIAGNOSIS — J302 Other seasonal allergic rhinitis: Secondary | ICD-10-CM

## 2019-02-24 MED ORDER — ADVAIR HFA 115-21 MCG/ACT IN AERO: 2.0000 | INHALATION_SPRAY | Freq: Two times a day (BID) | RESPIRATORY_TRACT | 5 refills | Status: DC

## 2019-02-24 MED FILL — ADVAIR HFA 115-21 MCG INH: 115-21 | 30 days supply | Qty: 12 | Fill #0

## 2019-02-24 NOTE — Patient Instructions (Addendum)
1. Moderate persistent asthma, uncomplicated - Lung testing looks great today. - We are not going to make any medication changes at this time.  - Daily controller medication(s): Symbicort 160/4.30mcg two puffs twice daily with spacer - Prior to physical activity: ProAir 2 puffs 10-15 minutes before physical activity. - Rescue medications: ProAir 4 puffs every 4-6 hours as needed - Asthma control goals:  * Full participation in all desired activities (may need albuterol before activity) * Albuterol use two time or less a week on average (not counting use with activity) * Cough interfering with sleep two time or less a month * Oral steroids no more than once a year * No hospitalizations  2. Chronic rhinitis (dust mite, cockroach, grasses, trees, and ragweed) - We will not make any medication changes at this time.  - Continue taking: Dymista (split up into fluticasone and azelastine) two sprays per nostril 1-2 times daily as needed (generic available now) and Xyzal 5mg  daily  - You can use an extra dose of the antihistamine, if needed, for breakthrough symptoms.  - Consider nasal saline rinses 1-2 times daily to remove allergens from the nasal cavities as well as help with mucous clearance (this is especially helpful to do before the nasal sprays are given)  3. Return in about 6 months (around 08/24/2019). This can be an in-person, a virtual Webex or a telephone follow up visit.   Please inform us of any Emergency Department visits, hospitalizations, or changes in symptoms. Call us before going to the ED for breathing or allergy symptoms since we might be able to fit you in for a sick visit. Feel free to contact us anytime with any questions, problems, or concerns.  It was a pleasure to see you again today!  Websites that have reliable patient information: 1. American Academy of Asthma, Allergy, and Immunology: www.aaaai.org 2. Food Allergy Research and Education (FARE): foodallergy.org 3.  Mothers of Asthmatics: http://www.asthmacommunitynetwork.org 4. American College of Allergy, Asthma, and Immunology: www.acaai.org   COVID-19 Vaccine Information can be found at: ShippingScam.co.uk For questions related to vaccine distribution or appointments, please email [email or web address] or call [phone].     "Like" [place] on Facebook and Instagram for our latest updates!        Make sure you are registered to vote! If you have moved or changed any of your contact information, you will need to get this updated before voting!  In some cases, you MAY be able to register to vote online: [email or web address]

## 2019-02-24 NOTE — Progress Notes (Signed)
FOLLOW UP  Date of Service/Encounter:  02/24/19   Assessment:   Moderate persistent asthma, uncomplicated  Perennial and seasonal allergic rhinitis (dust mite, cockroach, grasses, trees, and ragweed)  Vaccine hesitancy - but no allergies to PEG or polysorbate   Plan/Recommendations:   1. Moderate persistent asthma, uncomplicated - Lung testing looks great today. - We are not going to make any medication changes at this time.  - Daily controller medication(s): Symbicort 160/4.61mcg two puffs twice daily with spacer - Prior to physical activity: ProAir 2 puffs 10-15 minutes before physical activity. - Rescue medications: ProAir 4 puffs every 4-6 hours as needed - Asthma control goals:  * Full participation in all desired activities (may need albuterol before activity) * Albuterol use two time or less a week on average (not counting use with activity) * Cough interfering with sleep two time or less a month * Oral steroids no more than once a year * No hospitalizations  2. Chronic rhinitis (dust mite, cockroach, grasses, trees, and ragweed) - We will not make any medication changes at this time.  - Continue taking: Dymista (split up into fluticasone and azelastine) two sprays per nostril 1-2 times daily as needed (generic available now) and Xyzal 5mg  daily  - You can use an extra dose of the antihistamine, if needed, for breakthrough symptoms.  - Consider nasal saline rinses 1-2 times daily to remove allergens from the nasal cavities as well as help with mucous clearance (this is especially helpful to do before the nasal sprays are given)  3. Return in about 6 months (around 08/24/2019). This can be an in-person, a virtual Webex or a telephone follow up visit.  Subjective:   Molly [name] is a 39 y.o. female presenting today for follow up of  Chief Complaint  Patient presents with   Asthma   Allergic Rhinitis     Molly [name] has a history of the following: Patient  Active Problem List   Diagnosis Date Noted   Gastroesophageal reflux disease without esophagitis 11/16/2018   Severe obstructive sleep apnea-hypopnea syndrome 09/22/2018   Chronic intermittent hypoxia with obstructive sleep apnea 09/22/2018   Essential hypertension 03/02/2018   Morbid obesity ([place]) 02/02/2018   Moderate persistent asthma, uncomplicated [id number]   Non-seasonal allergic rhinitis due to pollen 12/31/2017   Generalized anxiety disorder 06/17/2017    History obtained from: chart review and patient.  Molly [name] is a 39 y.o. female presenting for a follow up visit.  She was last seen in July 2020 via televisit.  At the time, her regimen was working well.  We continued with Symbicort 160/4.5 mcg 2 puffs twice daily with a spacer.  For her rhinitis, we sent in Xyzal 5 mg daily.  We continue Dymista and recommended additional antihistamines as needed.  Since last visit, she has done well. She remains on CT duty.    Asthma/Respiratory Symptom History: She remains on the Symbicort two puffs BID. She feels that she is doing well "for the most part". She has a few times recently when she is busy at work with the N95 and she feels that this makes her breathing more "tight". This is not very often and only at work when she is working hard. She feels that if she takes a deep breath with cool air, she can do well. She has not needed prednisone at all in months or maybe even more than a year.   Allergic Rhinitis Symptom History: She did re home the rabbit because it was  messing up her breathing. She has been on her Xyzal "religiously" and she is using fluticasone and azelastine separately. She is using the fluticasone on an every day basis and then using the azelastine as needed.  She has not needed any antibiotics or prednisone at all since last visit.   She was diagnosed with OSA in October and is on a CPAP. She feels that this is doing well for her.  She is sleeping 7 to 10 hours a  night.  She does feel like she has more energy.  She is nervous about getting the COVID vaccine. She has reacted to the TB shot. She has never had problems with the flu shot. She has tolerated a bowel prep without a problem. She had colitis in her 11s and this was attributed to stress.   Otherwise, there have been no changes to her past medical history, surgical history, family history, or social history.    Review of Systems  Constitutional: Negative.  Negative for chills, fever, malaise/fatigue and weight loss.  HENT: Negative.  Negative for congestion, ear discharge and ear pain.   Eyes: Negative for pain, discharge and redness.  Respiratory: Negative for cough, sputum production, shortness of breath and wheezing.   Cardiovascular: Negative.  Negative for chest pain and palpitations.  Gastrointestinal: Negative for abdominal pain, diarrhea, heartburn, nausea and vomiting.  Skin: Negative.  Negative for itching and rash.  Neurological: Negative for dizziness and headaches.  Endo/Heme/Allergies: Negative for environmental allergies. Does not bruise/bleed easily.       Objective:   Blood pressure 130/74, pulse 90, temperature 97.9 F (36.6 C), temperature source Temporal, resp. rate 20, height 5\' 7"  (1.702 m), weight 287 lb 9.6 oz (130.5 kg), SpO2 98 %. Body mass index is 45.04 kg/m.   Physical Exam:  Physical Exam  Constitutional: She appears well-developed.  HENT:  Head: Normocephalic and atraumatic.  Right Ear: Tympanic membrane, external ear and ear canal normal. No drainage, swelling or tenderness. Tympanic membrane is not injected, not scarred, not erythematous, not retracted and not bulging.  Left Ear: Tympanic membrane, external ear and ear canal normal. No drainage, swelling or tenderness. Tympanic membrane is not injected, not scarred, not erythematous, not retracted and not bulging.  Nose: Mucosal edema and rhinorrhea present. No nasal deformity or septal deviation.  No epistaxis. Right sinus exhibits no maxillary sinus tenderness and no frontal sinus tenderness. Left sinus exhibits no maxillary sinus tenderness and no frontal sinus tenderness.  Mouth/Throat: Uvula is midline and oropharynx is clear and moist. Mucous membranes are not pale and not dry.  She does have some strings of clear rhinorrhea between the septum and the turbinates.  No purulent discharge.  No sinus tenderness.  Eyes: Pupils are equal, round, and reactive to light. Conjunctivae and EOM are normal. Right eye exhibits no chemosis and no discharge. Left eye exhibits no chemosis and no discharge. Right conjunctiva is not injected. Left conjunctiva is not injected.  Cardiovascular: Normal rate, regular rhythm and normal heart sounds.  Respiratory: Effort normal and breath sounds normal. No accessory muscle usage. No tachypnea. No respiratory distress. She has no wheezes. She has no rhonchi. She has no rales. She exhibits no tenderness.  Moving air well in all lung fields.  Lymphadenopathy:       Head (right side): No submandibular, no tonsillar and no occipital adenopathy present.       Head (left side): No submandibular, no tonsillar and no occipital adenopathy present.    She has  no cervical adenopathy.  Neurological: She is alert.  Skin: No abrasion, no petechiae and no rash noted. Rash is not papular, not vesicular and not urticarial. No erythema. No pallor.  No eczematous or oral lesions noted.  Psychiatric: She has a normal mood and affect.     Diagnostic studies:    Spirometry: results normal (FEV1: 3.07/99%, FVC: 3.54/89%, FEV1/FVC: 86%).    Spirometry consistent with normal pattern.   Allergy Studies: none       Salvatore Marvel, MD  [facility]

## 2019-02-24 NOTE — Progress Notes (Signed)
Referring Provider: Dettinger, Fransisca Kaufmann, MD Primary Care Physician:  Dettinger, Fransisca Kaufmann, MD Primary Gastroenterologist:  Dr. Gala Romney  Chief Complaint  Patient presents with  . Gastroesophageal Reflux    occasional colitis flare ups,bloating,gas,bitter taste in mouth    HPI:   Molly [name] is a 39 y.o. female presenting today at the request of Dettinger, Fransisca Kaufmann, MD for GERD.  Recently seen in the ED on 01/02/2019 for epigastric pain with associated vomiting.  CBC remarkable for slightly elevated WBC at 11.4 and slightly elevated platelets at 447.  Lipase normal.  CMP within normal limits except for glucose 105H.  Urinalysis normal.  Pregnancy test negative.  RUQ ultrasound with limited study due to body habitus but no obvious gallstones or wall thickening.  Hepatic steatosis noted.  CT abdomen and pelvis with no acute findings.  Today:  Presented to the ED due to intense abdominal pain, bloating, vomiting, and horrible taste in her mouth. Prilosec was increased to BID and pepcid was added. Over the next month tried to limit red sauces, wine, and cucumbers as these are known triggers for GERD. Second week of January, reflux started worsening again. Started taking digestive enzymes about 2 weeks ago and started feeling significantly better.   Reports intermittent GERD since about 7 years ago during pregnancy. Started Prilosec about 2 years ago.  GERD symptoms started worsening around November 2020 after starting CPAP. CPAP has been adjusted and is working appropriately.  She was having frequent reflux symptoms, intermittent epigastric pain, bloating, bad taste in her mouth, nausea, and a couple episodes of vomiting.  Since starting enzymes, has only had one episode of reflux. This was on a day she missed her enzymes.  No abdominal pain.  No nausea or vomiting. Bloating is better.  No early satiety.  No unintentional weight loss.  No dysphagia.  Eats fried foods about twice a week. No pork or  red meet. A lot of raw fruits and vegetables. Rare soda. Eating within 3 hours of going to bed. Sleeps on an incline. Rarely under 20 degrees.   Reports diagnosis of colitis in her 21s. Was having rectal bleeding. States she had a colonoscopy with biopsy and was told to have TCS every 5 years. At [facility]. No rectal bleeding since then.  No melena. States she has hemorrhoids that bother her at times. Hemorrhoids used to go back in but are out constantly. Has used witch hazel wipes as needed and suppositories in the past.   BMs every 2-3 days. Consistency varies from loose to a little more firm requiring straining.   No NSAIDs.   Past Medical History:  Diagnosis Date  . Anemia   . Colitis   . Colitis   . Hypertension   . Miscarriage   . Moderate persistent asthma, uncomplicated [id number]  . Recurrent upper respiratory infection (URI)   . Sleep apnea     Past Surgical History:  Procedure Laterality Date  . CESAREAN SECTION    . COLONOSCOPY     in her 98s    Current Outpatient Medications  Medication Sig Dispense Refill  . Albuterol Sulfate (PROAIR RESPICLICK) 123XX123 (90 Base) MCG/ACT AEPB Inhale 1 puff into the lungs every 6 (six) hours as needed. 1 each 1  . amLODipine (NORVASC) 5 MG tablet TAKE 1 TABLET BY MOUTH ONCE DAILY 90 tablet 0  . azelastine (ASTELIN) 0.1 % nasal spray Place 2 sprays into both nostrils daily. Use in each nostril as directed (Patient taking differently: Place  2 sprays into both nostrils as needed. Use in each nostril as directed) 30 mL 3  . Bioflavonoid Products (VITAMIN C) CHEW Chew 2-3 tablets by mouth daily.    . budesonide-formoterol (SYMBICORT) 160-4.5 MCG/ACT inhaler Inhale 2 puffs into the lungs 2 (two) times daily. 1 Inhaler 5  . buPROPion (WELLBUTRIN XL) 150 MG 24 hr tablet TAKE 1 TABLET BY MOUTH DAILY. 90 tablet 0  . Digestive Enzymes TABS Take by mouth 2 (two) times daily.    Marland Kitchen ELDERBERRY PO Take 1 tablet by mouth as needed.     . famotidine  (PEPCID) 20 MG tablet Take 1 tablet (20 mg total) by mouth 2 (two) times daily. 30 tablet 0  . fluticasone (FLONASE) 50 MCG/ACT nasal spray Place 1 spray into both nostrils daily. 16 g 3  . levocetirizine (XYZAL) 5 MG tablet Take 1 tablet (5 mg total) by mouth every evening. 30 tablet 5  . Melatonin 5 MG TABS Take by mouth as needed.    . Multiple Vitamin (MULTIVITAMIN WITH MINERALS) TABS tablet Take 1 tablet by mouth daily.    Marland Kitchen omeprazole (PRILOSEC) 20 MG capsule Take 1 capsule (20 mg total) by mouth 2 (two) times daily. 60 capsule 0  . ondansetron (ZOFRAN) 4 MG tablet Take 1 tablet (4 mg total) by mouth every 8 (eight) hours as needed for nausea or vomiting. (Patient taking differently: Take 4 mg by mouth as needed for nausea or vomiting. ) 20 tablet 0  . Probiotic Product (PROBIOTIC DAILY PO) Take by mouth daily.    . traZODone (DESYREL) 50 MG tablet Take 0.5-1 tablets (25-50 mg total) by mouth at bedtime as needed for sleep. 90 tablet 3  . fluticasone-salmeterol (ADVAIR HFA) 115-21 MCG/ACT inhaler Inhale 2 puffs into the lungs 2 (two) times daily. (Patient not taking: Reported on 02/25/2019) 1 Inhaler 5   No current facility-administered medications for this visit.    Allergies as of 02/25/2019 - Review Complete 02/25/2019  Allergen Reaction Noted  . Other Anaphylaxis 05/04/2016  . Dust mite extract  03/17/2018  . Lavender oil Hives 02/02/2016  . Mold extract [trichophyton]  03/17/2018    Family History  Problem Relation Age of Onset  . Diabetes Mother   . Hypertension Mother   . Hyperlipidemia Mother   . Allergic rhinitis Mother   . Diabetes Maternal Grandmother   . Heart disease Maternal Grandmother   . Cancer Maternal Grandmother        sarcoma in arm  . Kidney disease Maternal Grandmother   . Allergic rhinitis Maternal Grandmother   . Heart disease Maternal Grandfather        congestive heart failure  . Kidney disease Maternal Grandfather   . Cancer Maternal Grandfather         bladder  . Hypertension Maternal Grandfather   . Heart disease Paternal Grandmother   . Heart disease Paternal Grandfather   . COPD Paternal Grandfather   . Mental illness Paternal Grandfather        PTSD  . Colon cancer Other 1  . Asthma Neg Hx   . Eczema Neg Hx   . Urticaria Neg Hx     Social History   Socioeconomic History  . Marital status: Married    Spouse name: Not on file  . Number of children: Not on file  . Years of education: Not on file  . Highest education level: Not on file  Occupational History  . Not on file  Tobacco Use  .  Smoking status: Former Smoker    Types: Cigarettes    Quit date: 02/15/2011    Years since quitting: 8.0  . Smokeless tobacco: Never Used  Substance and Sexual Activity  . Alcohol use: Yes    Alcohol/week: 1.0 standard drinks    Types: 1 Cans of beer per week    Comment: occasional  . Drug use: No  . Sexual activity: Yes    Birth control/protection: None    Comment: married 6 years, trying to get pregnant  Other Topics Concern  . Not on file  Social History Narrative  . Not on file   Social Determinants of Health   Financial Resource Strain:   . Difficulty of Paying Living Expenses: Not on file  Food Insecurity:   . Worried About Charity fundraiser in the Last Year: Not on file  . Ran Out of Food in the Last Year: Not on file  Transportation Needs:   . Lack of Transportation (Medical): Not on file  . Lack of Transportation (Non-Medical): Not on file  Physical Activity:   . Days of Exercise per Week: Not on file  . Minutes of Exercise per Session: Not on file  Stress:   . Feeling of Stress : Not on file  Social Connections:   . Frequency of Communication with Friends and Family: Not on file  . Frequency of Social Gatherings with Friends and Family: Not on file  . Attends Religious Services: Not on file  . Active Member of Clubs or Organizations: Not on file  . Attends Archivist Meetings: Not on file   . Marital Status: Not on file  Intimate Partner Violence:   . Fear of Current or Ex-Partner: Not on file  . Emotionally Abused: Not on file  . Physically Abused: Not on file  . Sexually Abused: Not on file    Review of Systems: Gen: Denies any fever, chills, lightheadedness, dizziness, presyncope, or syncope. CV: Denies chest pain or heart palpitations. Resp: Has asthma. Occasional cough. No shortness of breath.  GI: See HPI GU : Denies urinary burning, urinary frequency, urinary hesitancy.  MS: Admits to shoulder pain intermittently.  Derm: Denies rash Psych: Denies depression or anxiety. Heme: Denies bruising or bleeding  Physical Exam: BP 125/90   Pulse 91   Temp (!) 96.9 F (36.1 C) (Temporal)   Ht 5\' 6"  (1.676 m)   Wt 292 lb (132.5 kg)   LMP 02/08/2019   BMI 47.13 kg/m  General:   Alert and oriented. Pleasant and cooperative. Well-nourished and well-developed.  Head:  Normocephalic and atraumatic. Eyes:  Without icterus, sclera clear and conjunctiva pink.  Ears:  Normal auditory acuity. Lungs:  Clear to auscultation bilaterally. No wheezes, rales, or rhonchi. No distress.  Heart:  S1, S2 present without murmurs appreciated.  Abdomen:  +BS, soft, non-tender and non-distended. No HSM noted. No guarding or rebound. No masses appreciated.  Rectal:  Deferred  Msk:  Symmetrical without gross deformities. Normal posture. Extremities:  Without edema. Neurologic:  Alert and  oriented x4;  grossly normal neurologically. Skin:  Intact without significant lesions or rashes. Psych: Normal mood and affect.

## 2019-02-25 ENCOUNTER — Ambulatory Visit (INDEPENDENT_AMBULATORY_CARE_PROVIDER_SITE_OTHER): Payer: No Typology Code available for payment source | Admitting: Gastroenterology

## 2019-02-25 ENCOUNTER — Encounter: Payer: Self-pay | Admitting: Gastroenterology

## 2019-02-25 VITALS — BP 125/90 | HR 91 | Temp 96.9°F | Ht 66.0 in | Wt 292.0 lb

## 2019-02-25 DIAGNOSIS — K219 Gastro-esophageal reflux disease without esophagitis: Secondary | ICD-10-CM

## 2019-02-25 DIAGNOSIS — Z8719 Personal history of other diseases of the digestive system: Secondary | ICD-10-CM | POA: Diagnosis not present

## 2019-02-25 NOTE — Patient Instructions (Addendum)
Continue omeprazole 20 mg twice daily 30 minutes before breakfast and 30 minutes before dinner.  Continue over-the-counter digestive enzymes.  You may try stopping Pepcid.  If you have recurrence of reflux symptoms, you may resume Pepcid.  Be sure you are following a GERD diet.  Avoid your known triggers.  Also be sure to avoid fried, fatty, greasy, spicy foods.  Limit citrus foods.  Avoid caffeine, carbonated beverages, and chocolate.  Do not eat within 3 hours of laying down.  Continue sleeping at an incline.  I would also recommend weight loss with goal of 1 pound per week through diet and exercise as this will likely also help your reflux symptoms.  Avoid all NSAIDs including ibuprofen, Aleve, Advil, Goody powders.  Try adding Benefiber or Metamucil daily to help with bowel regularity.  You may follow the instructions on the container.  Start with a low dose and increase slowly.  If you develop significant bloating that is uncomfortable, you may decrease the dose or discontinue.  Drink enough water to keep your urine pale yellow to clear.  I am requesting your colonoscopy records from [facility].  We will reach out to you if you need another colonoscopy.  We will plan to follow-up with you in 6 months.  Call if you have questions or concerns prior.  Aliene Altes, PA-C [facility]     Food Choices for Gastroesophageal Reflux Disease, Adult When you have gastroesophageal reflux disease (GERD), the foods you eat and your eating habits are very important. Choosing the right foods can help ease your discomfort. Think about working with a nutrition specialist (dietitian) to help you make good choices. What are tips for following this plan?  Meals  Choose healthy foods that are low in fat, such as fruits, vegetables, whole grains, low-fat dairy products, and lean meat, fish, and poultry.  Eat small meals often instead of 3 large meals a day. Eat your meals slowly, and  in a place where you are relaxed. Avoid bending over or lying down until 2-3 hours after eating.  Avoid eating meals 2-3 hours before bed.  Avoid drinking a lot of liquid with meals.  Cook foods using methods other than frying. Bake, grill, or broil food instead.  Avoid or limit: ? Chocolate. ? Peppermint or spearmint. ? Alcohol. ? Pepper. ? Black and decaffeinated coffee. ? Black and decaffeinated tea. ? Bubbly (carbonated) soft drinks. ? Caffeinated energy drinks and soft drinks.  Limit high-fat foods such as: ? Fatty meat or fried foods. ? Whole milk, cream, butter, or ice cream. ? Nuts and nut butters. ? Pastries, donuts, and sweets made with butter or shortening.  Avoid foods that cause symptoms. These foods may be different for everyone. Common foods that cause symptoms include: ? Tomatoes. ? Oranges, lemons, and limes. ? Peppers. ? Spicy food. ? Onions and garlic. ? Vinegar. Lifestyle  Maintain a healthy weight. Ask your doctor what weight is healthy for you. If you need to lose weight, work with your doctor to do so safely.  Exercise for at least 30 minutes for 5 or more days each week, or as told by your doctor.  Wear loose-fitting clothes.  Do not smoke. If you need help quitting, ask your doctor.  Sleep with the head of your bed higher than your feet. Use a wedge under the mattress or blocks under the bed frame to raise the head of the bed. Summary  When you have gastroesophageal reflux disease (GERD), food and lifestyle  choices are very important in easing your symptoms.  Eat small meals often instead of 3 large meals a day. Eat your meals slowly, and in a place where you are relaxed.  Limit high-fat foods such as fatty meat or fried foods.  Avoid bending over or lying down until 2-3 hours after eating.  Avoid peppermint and spearmint, caffeine, alcohol, and chocolate. This information is not intended to replace advice given to you by your health care  provider. Make sure you discuss any questions you have with your health care provider. Document Revised: 04/30/2018 Document Reviewed: 02/13/2016 Elsevier Patient Education  [place].

## 2019-02-25 NOTE — Assessment & Plan Note (Addendum)
Patient reports being diagnosed with colitis in her 77s.  States she was having abdominal pain and rectal bleeding and had emergent colonoscopy at [facility].  Reports biopsies revealed colitis but is unsure of what type.  States she was not told she had Crohn's or ulcerative colitis but she was advised to have a colonoscopy every 5 years.  She has not had a colonoscopy since then.  No rectal bleeding or melena.  No unintentional weight loss.  Currently with BMs every 2-3 days with consistency varying from loose to little more firm.   As she is without any alarm symptoms, I will request TCS records from [facility] and determine need for colonoscopy at that time. She is advised to add Benefiber or Metamucil daily to help with bowel regularity. Drink enough water to keep urine pale yellow to clear. Follow-up in 6 months for GERD.

## 2019-02-25 NOTE — Assessment & Plan Note (Addendum)
39 year old female with history of obesity, HTN, asthma, sleep apnea who reports history of GERD on Prilosec daily for 2 years.  Symptoms began worsening in November 2020 with associated intermittent epigastric pain, bloating, nausea, couple episodes of vomiting, bad taste in her mouth.  She was seen in the the ED in December 2020 for flare of symptoms with work-up including CBC, lipase, CMP, urinalysis, pregnancy test, right upper quadrant ultrasound, CT abdomen and pelvis without acute findings.  Omeprazole 20 milligrams was increased to twice daily, Pepcid 20 mg twice daily was added.  Also started avoiding trigger foods.  Improved initially but began worsening again in January 2021.  Ultimately, she started taking OTC digestive enzymes which have essentially resolved all of her upper GI symptoms.  She is without any alarm symptoms.  No NSAIDs.  Abdominal exam is benign today.  Suspect patients body habitus and dietary habits are likely influencing her GERD symptoms.  Suspect abdominal pain was likely secondary to gastritis.  Bloating influenced by frequent consumption of raw vegetables.  No early satiety to suggest gastroparesis although this is still a possibility. Less likely pancreatic insufficiency without any history of diabetes. As she is asymptomatic today, no further evaluation is needed.   She will continue omeprazole 20 mg BID and digestive enzymes.  She was advised to try stopping Pepcid.  She has return of reflux symptoms, she can resume Pepcid. Continue to avoid trigger foods. Counseled on GERD diet.  Advised to avoid fried, fatty, greasy, spicy foods.  Limit citrus foods.  Avoid caffeine, carbonated beverages, chocolate.  Do not eat within 3 hours of laying down.  Continue to sleep at an incline. GERD handout provided. Recommended weight loss of 1 pound per week through diet and exercise. Continue to avoid all NSAIDs. Discussed foods that cause bloating and to try limiting these if  bloating were to return.  Follow-up in 6 months.

## 2019-02-25 NOTE — Progress Notes (Signed)
Cc'ed to pcp °

## 2019-03-01 ENCOUNTER — Encounter: Payer: Self-pay | Admitting: Gastroenterology

## 2019-03-01 ENCOUNTER — Other Ambulatory Visit: Payer: Self-pay | Admitting: General Surgery

## 2019-03-01 ENCOUNTER — Telehealth: Payer: Self-pay | Admitting: Gastroenterology

## 2019-03-01 ENCOUNTER — Other Ambulatory Visit (HOSPITAL_COMMUNITY): Payer: Self-pay | Admitting: General Surgery

## 2019-03-01 DIAGNOSIS — K219 Gastro-esophageal reflux disease without esophagitis: Secondary | ICD-10-CM

## 2019-03-01 NOTE — Telephone Encounter (Signed)
Received and reviewed colonoscopy records from [facility] dated 07/15/2002.  Indication: 39 year old female with crampy abdominal pain with bloody loose stools. Findings: 1.  External inspection of the anus revealed no abnormalities. 2.  Digital exam of the rectum revealed no rectal masses.  No blood was noted on the glove. 3.  The colonoscope passed all the way to the cecum. 4.  The colon appeared entirely normal except for a short segment of the distal sigmoid and rectosigmoid from about 15 cm to 25 cm.  Biopsies taken of this area.  Biopsies also taken of the transverse and ascending colon. 5.  Stool collected for ova and parasites, C&S, blood cell count, and C. difficile.  Pathology: Biopsy of ascending colon: No pathologic diagnosis. Biopsy of descending colon: No pathologic diagnosis. Biopsy of sigmoid colon: Mild interstitial congestion and edema. Biopsies of distal sigmoid and rectum: Focal acute and chronic colitis, nonspecific.  The pathology description stated no pseudomembranes or ulcerations present.  No granulomas or crypt abscesses are identified.  No further recommendations at this time as patient is without any signs or symptoms of active colitis. Additionally, CT A/P with contrast in December without colonic abnormalities. No need for early interval colonoscopy at this time. Will have records scanned into patients chart.

## 2019-03-04 ENCOUNTER — Other Ambulatory Visit: Payer: Self-pay

## 2019-03-05 ENCOUNTER — Encounter: Payer: Self-pay | Admitting: Family Medicine

## 2019-03-05 ENCOUNTER — Ambulatory Visit (INDEPENDENT_AMBULATORY_CARE_PROVIDER_SITE_OTHER): Payer: No Typology Code available for payment source | Admitting: Family Medicine

## 2019-03-05 VITALS — BP 123/78 | HR 92 | Temp 98.0°F | Ht 66.0 in | Wt 284.4 lb

## 2019-03-05 DIAGNOSIS — R41840 Attention and concentration deficit: Secondary | ICD-10-CM

## 2019-03-05 MED ORDER — ATOMOXETINE HCL 40 MG PO CAPS: 40.0000 mg | ORAL_CAPSULE | Freq: Every day | ORAL | 2 refills | Status: DC

## 2019-03-05 MED FILL — ATOMOXETINE HCL 40 MG CAPS: 40 | 30 days supply | Qty: 30 | Fill #0

## 2019-03-05 NOTE — Progress Notes (Signed)
BP 123/78   Pulse 92   Temp 98 F (36.7 C) (Temporal)   Ht 5\' 6"  (1.676 m)   Wt 284 lb 6.4 oz (129 kg)   LMP 02/08/2019   SpO2 98%   BMI 45.90 kg/m    Subjective:   Patient ID: Molly [name], female    DOB: [birth year], 39 y.o.   MRN: [id number]  HPI: Molly [name] is a 39 y.o. female presenting on 03/05/2019 for ADHD (Patient states that she keeps getting of track easy and can not stay focused) and Weight Loss (Needs a paper filled out for [place])   HPI Patient says she has been have a lot of difficulty with focusing and staying on task.  She says she gets distracted early especially at work and has trouble finishing tasks and this is been increasing over the past [name] months.  She has had issues off and on a little bit but this is the most that she is never had issues with this.  Patient is morbidly obese and wants to look into weight loss, she is tried a lot of different options already and she wants to go forward with weight loss surgery and already has a referral to the clinic but just needs a form today signed saying it is medically necessary and would benefit her.  With her age she already has signs of sleep apnea and hypertension which are both affected by obesity.  Relevant past medical, surgical, family and social history reviewed and updated as indicated. Interim medical history since our last visit reviewed. Allergies and medications reviewed and updated.  Review of Systems  Constitutional: Negative for chills and fever.  Eyes: Negative for visual disturbance.  Respiratory: Negative for chest tightness and shortness of breath.   Cardiovascular: Negative for chest pain and leg swelling.  Musculoskeletal: Negative for back pain and gait problem.  Skin: Negative for rash.  Neurological: Negative for light-headedness and headaches.  Psychiatric/Behavioral: Positive for decreased concentration. Negative for agitation, behavioral problems and sleep disturbance. The  patient is not nervous/anxious.   All other systems reviewed and are negative.   Per HPI unless specifically indicated above   Allergies as of 03/05/2019      Reactions   Other Anaphylaxis   Reaction to hay (something in [place] pig's cage), dog and mice   Dust Mite Extract    Lavender Oil Hives   Mold Extract [trichophyton]       Medication List       Accurate as of March 05, 2019  2:49 PM. If you have any questions, ask your nurse or doctor.        STOP taking these medications   famotidine 20 MG tablet Commonly known as: PEPCID Stopped by: Fransisca Kaufmann Cliffton Spradley, MD   ondansetron 4 MG tablet Commonly known as: Zofran Stopped by: Worthy Rancher, MD     TAKE these medications   Advair HFA 115-21 MCG/ACT inhaler Generic drug: fluticasone-salmeterol Inhale 2 puffs into the lungs 2 (two) times daily.   amLODipine 5 MG tablet Commonly known as: NORVASC TAKE 1 TABLET BY MOUTH ONCE DAILY   atomoxetine 40 MG capsule Commonly known as: Strattera Take 1 capsule (40 mg total) by mouth daily. Started by: Fransisca Kaufmann Dimitris Shanahan, MD   azelastine 0.1 % nasal spray Commonly known as: ASTELIN Place 2 sprays into both nostrils daily. Use in each nostril as directed What changed:   when to take this  reasons to take this   budesonide-formoterol  160-4.5 MCG/ACT inhaler Commonly known as: Symbicort Inhale 2 puffs into the lungs 2 (two) times daily.   buPROPion 150 MG 24 hr tablet Commonly known as: WELLBUTRIN XL TAKE 1 TABLET BY MOUTH DAILY.   Digestive Enzymes Tabs Take by mouth 2 (two) times daily.   ELDERBERRY PO Take 1 tablet by mouth as needed.   fluticasone 50 MCG/ACT nasal spray Commonly known as: FLONASE Place 1 spray into both nostrils daily.   levocetirizine 5 MG tablet Commonly known as: XYZAL Take 1 tablet (5 mg total) by mouth every evening.   Melatonin 5 MG Tabs Take by mouth as needed.   multivitamin with minerals Tabs tablet Take 1 tablet by  mouth daily.   omeprazole 20 MG capsule Commonly known as: PRILOSEC Take 1 capsule (20 mg total) by mouth 2 (two) times daily.   ProAir RespiClick [id number] (90 Base) MCG/ACT Aepb Generic drug: Albuterol Sulfate Inhale 1 puff into the lungs every 6 (six) hours as needed.   PROBIOTIC DAILY PO Take by mouth daily.   traZODone 50 MG tablet Commonly known as: DESYREL Take 0.5-1 tablets (25-50 mg total) by mouth at bedtime as needed for sleep.   Vitamin C Chew Chew 2-3 tablets by mouth daily.        Objective:   BP 123/78   Pulse 92   Temp 98 F (36.7 C) (Temporal)   Ht 5\' 6"  (1.676 m)   Wt 284 lb 6.4 oz (129 kg)   LMP 02/08/2019   SpO2 98%   BMI 45.90 kg/m   Wt Readings from Last 3 Encounters:  03/05/19 284 lb 6.4 oz (129 kg)  02/25/19 292 lb (132.5 kg)  02/24/19 287 lb 9.6 oz (130.5 kg)    Physical Exam Vitals and nursing note reviewed.  Constitutional:      General: She is not in acute distress.    Appearance: She is well-developed. She is not diaphoretic.  Eyes:     Conjunctiva/sclera: Conjunctivae normal.  Cardiovascular:     Rate and Rhythm: Normal rate and regular rhythm.     Heart sounds: Normal heart sounds. No murmur.  Pulmonary:     Effort: Pulmonary effort is normal. No respiratory distress.     Breath sounds: Normal breath sounds. No wheezing.  Musculoskeletal:        General: No tenderness. Normal range of motion.  Skin:    General: Skin is warm and dry.     Findings: No rash.  Neurological:     Mental Status: She is alert and oriented to person, place, and time.     Coordination: Coordination normal.  Psychiatric:        Attention and Perception: She is inattentive.        Mood and Affect: Mood is not anxious or depressed.        Behavior: Behavior normal.        Thought Content: Thought content does not include suicidal ideation. Thought content does not include suicidal plan.       Assessment & Plan:   Problem List Items Addressed This  Visit      Other   Morbid obesity ([place])   Relevant Medications   atomoxetine (STRATTERA) 40 MG capsule    Other Visit Diagnoses    Attention deficit    -  Primary   Relevant Medications   atomoxetine (STRATTERA) 40 MG capsule      Will start patient on Strattera to try for ADHD Follow up plan: Return in  about 2 months (around 05/03/2019), or if symptoms worsen or fail to improve, for adhd.  Counseling provided for all of the vaccine components No orders of the defined types were placed in this encounter.   Caryl Pina, MD [facility] 03/05/2019, 2:49 PM

## 2019-03-15 ENCOUNTER — Other Ambulatory Visit: Payer: Self-pay

## 2019-03-15 ENCOUNTER — Ambulatory Visit (HOSPITAL_COMMUNITY)
Admission: RE | Admit: 2019-03-15 | Discharge: 2019-03-15 | Disposition: A | Discharge status: Home / Self Care | Payer: No Typology Code available for payment source | Source: Ambulatory Visit | Attending: General Surgery | Admitting: General Surgery

## 2019-03-15 ENCOUNTER — Other Ambulatory Visit (HOSPITAL_COMMUNITY)
Admission: RE | Admit: 2019-03-15 | Discharge: 2019-03-15 | Disposition: A | Discharge status: Home / Self Care | Payer: No Typology Code available for payment source | Source: Ambulatory Visit | Attending: General Surgery | Admitting: General Surgery

## 2019-03-15 DIAGNOSIS — K219 Gastro-esophageal reflux disease without esophagitis: Secondary | ICD-10-CM

## 2019-03-15 LAB — HEMOGLOBIN A1C
Hgb A1c MFr Bld: 5.7 % — ABNORMAL HIGH (ref 4.8–5.6)
Mean Plasma Glucose: 116.89 mg/dL

## 2019-03-15 LAB — TSH: TSH: 4.083 u[IU]/mL (ref 0.350–4.500)

## 2019-03-15 LAB — IRON: Iron: 34 ug/dL (ref 28–170)

## 2019-03-15 LAB — VITAMIN B12: Vitamin B-12: 123 pg/mL — ABNORMAL LOW (ref 180–914)

## 2019-03-18 LAB — 25-HYDROXY VITAMIN D LCMS D2+D3
25-Hydroxy, Vitamin D-2: <1 ng/mL
25-Hydroxy, Vitamin D-3: 17 ng/mL
25-Hydroxy, Vitamin D: 17 ng/mL — ABNORMAL LOW

## 2019-03-18 LAB — VITAMIN D 1,25 DIHYDROXY
Vitamin D 1, 25 (OH)2 Total: 52 pg/mL
Vitamin D2 1, 25 (OH)2: <10 pg/mL
Vitamin D3 1, 25 (OH)2: 52 pg/mL

## 2019-03-23 ENCOUNTER — Encounter: Payer: Self-pay | Admitting: *Deleted

## 2019-03-23 ENCOUNTER — Encounter: Payer: Self-pay | Admitting: Family Medicine

## 2019-03-24 ENCOUNTER — Encounter: Payer: No Typology Code available for payment source | Attending: General Surgery | Admitting: Skilled Nursing Facility1

## 2019-03-24 ENCOUNTER — Encounter: Payer: Self-pay | Admitting: Skilled Nursing Facility1

## 2019-03-24 ENCOUNTER — Other Ambulatory Visit: Payer: Self-pay

## 2019-03-24 DIAGNOSIS — E669 Obesity, unspecified: Secondary | ICD-10-CM | POA: Insufficient documentation

## 2019-03-24 NOTE — Progress Notes (Signed)
Nutrition Assessment for Bariatric Surgery Medical Nutrition Therapy  Patient was seen on 03/24/2019 for Pre-Operative Nutrition Assessment. Letter of approval faxed to [facility] bariatric surgery program coordinator on 03/24/2019.   Referral stated Supervised Weight Loss (SWL) visits needed: 0  Planned surgery: Sleeve Gastrectomy Pt expectation of surgery: to be healthy Pt expectation of dietitian: none identified     NUTRITION ASSESSMENT   Anthropometrics  Start weight at [facility]: 284 lbs (date: 03/24/2019)  Height: 65 in BMI: 45.84 kg/m2     Clinical  Medical hx: colitis, GERD Medications:  Labs:  Notable signs/symptoms:   Lifestyle & Dietary Hx  For now Sleeve is chosen until surgeon determines if hiatal hernia. Pt states she Had a reflux episode needing to be hospitalized taking is now taking digestive enzymes and now has no reflux. Pt states she does not want to get fixated on a weight number,  Pt state she wears her C-PAP nightly Pt state she is a picky eater only eating chicken for protein. Pt states she owns 10 acres.  Pt states she is trying out second shift but works 3rd shift.  Pt states working 3rd shift she snacks to stay awake mostly fruit or protein smoothie.  Pt states she has has been logging her food for the last 3 weeks. Logging has helped to make different choices with snacks.   24-Hr Dietary Recall First Meal 11am:  egg and cheese on wheat and [place] bacon or 2 boiled egg and fruit with fiber supplement in water Snack: protein shake Second Meal: skipped Snack: crackers and cheese Third Meal: restaurant food Snack:  Beverages: water with flavoring, skim milk   Estimated Energy Needs Calories: 1500 Carbohydrate: 170g Protein: 112g Fat: 42g   NUTRITION DIAGNOSIS  Overweight/obesity ([place]-3.3) related to past poor dietary habits and physical inactivity as evidenced by patient w/ planned sleeve gastrectomy surgery following dietary  guidelines for continued weight loss.    NUTRITION INTERVENTION  Nutrition counseling (C-1) and education (E-2) to facilitate bariatric surgery goals.   Pre-Op Goals Reviewed with the Patient . Track food and beverage intake (pen and paper, MyFitness Pal, Baritastic app, etc.) . Make healthy food choices while monitoring portion sizes . Consume 3 meals per day or try to eat every 3-5 hours . Avoid concentrated sugars and fried foods . Keep sugar & fat in the single digits per serving on food labels . Practice CHEWING your food (aim for applesauce consistency) . Practice not drinking 15 minutes before, during, and 30 minutes after each meal and snack . Avoid all carbonated beverages (ex: soda, sparkling beverages)  . Limit caffeinated beverages (ex: coffee, tea, energy drinks) . Avoid all sugar-sweetened beverages (ex: regular soda, sports drinks)  . Avoid alcohol  . Aim for 64-100 ounces of FLUID daily (with at least half of fluid intake being plain water)  . Aim for at least 60-80 grams of PROTEIN daily . Look for a liquid protein source that contains ?15 g protein and ?5 g carbohydrate (ex: shakes, drinks, shots) . Make a list of non-food related activities . Physical activity is an important part of a healthy lifestyle so keep it moving! The goal is to reach 150 minutes of exercise per week, including cardiovascular and weight baring activity.  *Goals that are bolded indicate the pt would like to start working towards these  Handouts Provided Include  . Bariatric Surgery handouts (Nutrition Visits, Pre-Op Goals, Protein Shakes, Vitamins & Minerals)  Learning Style & Readiness for Change  Teaching method utilized: Visual & Auditory  Demonstrated degree of understanding via: Teach Back  Barriers to learning/adherence to lifestyle change: none identified      MONITORING & EVALUATION Dietary intake, weekly physical activity, body weight, and pre-op goals reached at next nutrition  visit.    Next Steps  Patient is to follow up at [place] for Pre-Op Class >2 weeks before surgery for further nutrition education.

## 2019-03-31 ENCOUNTER — Other Ambulatory Visit: Payer: Self-pay | Admitting: Allergy & Immunology

## 2019-03-31 MED FILL — LEVOCETIRIZINE 5 MG TABLET: 5 | 30 days supply | Qty: 30 | Fill #0

## 2019-04-20 ENCOUNTER — Other Ambulatory Visit: Payer: Self-pay | Admitting: Family Medicine

## 2019-04-20 DIAGNOSIS — F411 Generalized anxiety disorder: Secondary | ICD-10-CM

## 2019-04-20 MED FILL — buPROPion HCL ER (XL) 150 M: 150 | 90 days supply | Qty: 90 | Fill #0

## 2019-04-20 MED FILL — FLUTICASONE PROP 50 MCG SPR: 50 | 60 days supply | Qty: 16 | Fill #1

## 2019-05-03 ENCOUNTER — Ambulatory Visit: Payer: No Typology Code available for payment source | Admitting: Family Medicine

## 2019-05-20 MED FILL — LEVOCETIRIZINE 5 MG TABLET: 5 | 90 days supply | Qty: 90 | Fill #1

## 2019-06-09 ENCOUNTER — Other Ambulatory Visit: Payer: Self-pay | Admitting: Family Medicine

## 2019-06-09 DIAGNOSIS — I10 Essential (primary) hypertension: Secondary | ICD-10-CM

## 2019-06-09 MED FILL — AMLODIPINE BESYLATE 5 MG TA: 5 | 30 days supply | Qty: 30 | Fill #0

## 2019-07-05 ENCOUNTER — Other Ambulatory Visit: Payer: Self-pay | Admitting: Family Medicine

## 2019-07-05 DIAGNOSIS — I10 Essential (primary) hypertension: Secondary | ICD-10-CM

## 2019-07-06 MED ORDER — AMLODIPINE BESYLATE 5 MG PO TABS: 5.0000 mg | ORAL_TABLET | Freq: Every day | ORAL | 0 refills | Status: DC

## 2019-07-06 MED FILL — AMLODIPINE BESYLATE 5 MG TA: 5 | 30 days supply | Qty: 30 | Fill #0

## 2019-07-06 NOTE — Telephone Encounter (Signed)
Dettinger. NTBS 30 days given 06/09/19

## 2019-07-06 NOTE — Addendum Note (Signed)
Addended by: Nigel Berthold C on: 07/06/2019 11:00 AM   Modules accepted: Orders

## 2019-07-06 NOTE — Telephone Encounter (Signed)
Appointment scheduled- one month supply given.

## 2019-07-15 ENCOUNTER — Encounter: Payer: Self-pay | Admitting: Family Medicine

## 2019-07-15 ENCOUNTER — Ambulatory Visit (INDEPENDENT_AMBULATORY_CARE_PROVIDER_SITE_OTHER): Payer: No Typology Code available for payment source

## 2019-07-15 ENCOUNTER — Other Ambulatory Visit: Payer: Self-pay

## 2019-07-15 ENCOUNTER — Ambulatory Visit (INDEPENDENT_AMBULATORY_CARE_PROVIDER_SITE_OTHER): Payer: No Typology Code available for payment source | Admitting: Family Medicine

## 2019-07-15 VITALS — BP 122/76 | HR 75 | Temp 97.8°F | Resp 20 | Ht 66.0 in | Wt 275.5 lb

## 2019-07-15 DIAGNOSIS — R1031 Right lower quadrant pain: Secondary | ICD-10-CM

## 2019-07-15 DIAGNOSIS — R109 Unspecified abdominal pain: Secondary | ICD-10-CM

## 2019-07-15 LAB — CBC WITH DIFFERENTIAL/PLATELET
Basophils Absolute: 0.1 10*3/uL (ref 0.0–0.2)
Basos: 1 %
EOS (ABSOLUTE): 0.3 10*3/uL (ref 0.0–0.4)
Eos: 3 %
Hematocrit: 38.9 % (ref 34.0–46.6)
Hemoglobin: 12.6 g/dL (ref 11.1–15.9)
Immature Grans (Abs): 0 10*3/uL (ref 0.0–0.1)
Immature Granulocytes: 0 %
Lymphocytes Absolute: 2.4 10*3/uL (ref 0.7–3.1)
Lymphs: 27 %
MCH: 25 pg — ABNORMAL LOW (ref 26.6–33.0)
MCHC: 32.4 g/dL (ref 31.5–35.7)
MCV: 77 fL — ABNORMAL LOW (ref 79–97)
Monocytes Absolute: 0.6 10*3/uL (ref 0.1–0.9)
Monocytes: 6 %
Neutrophils Absolute: 5.6 10*3/uL (ref 1.4–7.0)
Neutrophils: 63 %
Platelets: 421 10*3/uL (ref 150–450)
RBC: 5.03 x10E6/uL (ref 3.77–5.28)
RDW: 15 % (ref 11.7–15.4)
WBC: 8.9 10*3/uL (ref 3.4–10.8)

## 2019-07-15 LAB — CMP14+EGFR
ALT: 6 IU/L (ref 0–32)
AST: 6 IU/L (ref 0–40)
Albumin/Globulin Ratio: 1.3 (ref 1.2–2.2)
Albumin: 4 g/dL (ref 3.8–4.8)
Alkaline Phosphatase: 107 IU/L (ref 48–121)
BUN/Creatinine Ratio: 17 (ref 9–23)
BUN: 13 mg/dL (ref 6–20)
Bilirubin Total: <0.2 mg/dL (ref 0.0–1.2)
CO2: 23 mmol/L (ref 20–29)
Calcium: 9.4 mg/dL (ref 8.7–10.2)
Chloride: 104 mmol/L (ref 96–106)
Creatinine, Ser: 0.76 mg/dL (ref 0.57–1.00)
GFR calc Af Amer: 116 mL/min/{1.73_m2} (ref >59)
GFR calc non Af Amer: 101 mL/min/{1.73_m2} (ref >59)
Globulin, Total: 3.1 g/dL (ref 1.5–4.5)
Glucose: 101 mg/dL — ABNORMAL HIGH (ref 65–99)
Potassium: 4 mmol/L (ref 3.5–5.2)
Sodium: 140 mmol/L (ref 134–144)
Total Protein: 7.1 g/dL (ref 6.0–8.5)

## 2019-07-15 LAB — URINALYSIS, COMPLETE
Bilirubin, UA: NEGATIVE
Glucose, UA: NEGATIVE
Ketones, UA: NEGATIVE
Leukocytes,UA: NEGATIVE
Nitrite, UA: NEGATIVE
Protein,UA: NEGATIVE
Specific Gravity, UA: >1.03 — ABNORMAL HIGH (ref 1.005–1.030)
Urobilinogen, Ur: 0.2 mg/dL (ref 0.2–1.0)
pH, UA: 5.5 (ref 5.0–7.5)

## 2019-07-15 LAB — MICROSCOPIC EXAMINATION
Epithelial Cells (non renal): >10 /hpf — AB (ref 0–10)
Renal Epithel, UA: NONE SEEN /hpf

## 2019-07-15 LAB — LIPASE: Lipase: 20 U/L (ref 14–72)

## 2019-07-15 LAB — AMYLASE: Amylase: 55 U/L (ref 31–110)

## 2019-07-15 NOTE — Progress Notes (Signed)
Subjective:  Patient ID: Molly [name], female    DOB: [birth year]  Age: 39 y.o. MRN: [id number]  CC: No chief complaint on file.   HPI Molly [name] presents for 3 days of increasing right flank pain.  She started her menses during this time.  She has had partial relief over the last couple of days following a lidocaine patch from her husband.  She describes the pain as a tightness with sharp pains in the right lower quadrant.  She is concerned about a possible kidney stone.  Pain is 5-6/10 constantly.  The sharp pains go to a 7/10.  She cannot get relief no matter what position she is in.  Appetite has been diminished.  Depression screen [facility] 2/9 07/15/2019 03/05/2019 05/29/2018  Decreased Interest 0 0 0  Down, Depressed, Hopeless 0 0 0  PHQ - 2 Score 0 0 0    History Molly [name] has a past medical history of Anemia, Colitis, Colitis, Hypertension, Miscarriage, Moderate persistent asthma, uncomplicated (73/22/0254), Recurrent upper respiratory infection (URI), and Sleep apnea.   She has a past surgical history that includes Cesarean section and Colonoscopy (07/15/2002).   Her family history includes Allergic rhinitis in her maternal grandmother and mother; COPD in her paternal grandfather; Cancer in her maternal grandfather and maternal grandmother; Colon cancer (age of onset: 59) in an other family member; Diabetes in her maternal grandmother and mother; Heart disease in her maternal grandfather, maternal grandmother, paternal grandfather, and paternal grandmother; Hyperlipidemia in her mother; Hypertension in her maternal grandfather and mother; Kidney disease in her maternal grandfather and maternal grandmother; Mental illness in her paternal grandfather.She reports that she quit smoking about 8 years ago. Her smoking use included cigarettes. She has never used smokeless tobacco. She reports current alcohol use of about 1.0 standard drink of alcohol per week. She reports that she does not use  drugs.    ROS Review of Systems  Constitutional: Negative.   HENT: Negative.   Eyes: Negative for visual disturbance.  Respiratory: Negative for shortness of breath.   Cardiovascular: Negative for chest pain.  Gastrointestinal: Positive for abdominal pain.  Genitourinary: Positive for flank pain. Negative for dysuria.  Musculoskeletal: Positive for back pain. Negative for arthralgias.    Objective:  BP 122/76   Pulse 75   Temp 97.8 F (36.6 C) (Temporal)   Resp 20   Ht _0  (1.676 m)   Wt 275 lb 8 oz (125 kg)   SpO2 97%   BMI 44.47 kg/m   BP Readings from Last 3 Encounters:  07/15/19 122/76  03/05/19 123/78  02/25/19 125/90    Wt Readings from Last 3 Encounters:  07/15/19 275 lb 8 oz (125 kg)  03/24/19 284 lb (128.8 kg)  03/05/19 284 lb 6.4 oz (129 kg)     Physical Exam Constitutional:      Appearance: She is well-developed.  HENT:     Head: Normocephalic and atraumatic.  Cardiovascular:     Rate and Rhythm: Normal rate and regular rhythm.     Heart sounds: No murmur heard.   Pulmonary:     Effort: Pulmonary effort is normal.     Breath sounds: Normal breath sounds.  Abdominal:     General: Bowel sounds are normal.     Palpations: Abdomen is soft. There is no mass.     Tenderness: There is abdominal tenderness (  Right lower quadrant.  Negative McBurney's point tenderness). There is no guarding or rebound.  Skin:  General: Skin is warm and dry.  Neurological:     Mental Status: She is alert and oriented to person, place, and time.  Psychiatric:        Behavior: Behavior normal.     Abdominal x-ray shows some extra gas in the transverse colon.  Otherwise no abnormality noted.  Results for orders placed or performed in visit on 07/15/19  Microscopic Examination   Urine  Result Value Ref Range   WBC, UA 0-5 0 - 5 /hpf   RBC 3-10 (A) 0 - 2 /hpf   Epithelial Cells (non renal) >10 (A) 0 - 10 /hpf   Renal Epithel, UA None seen None seen /hpf    Mucus, UA Present Not Estab.   Bacteria, UA Moderate (A) None seen/Few  Urinalysis, Complete  Result Value Ref Range   Specific Gravity, UA >1.030 (H) 1.005 - 1.030   pH, UA 5.5 5.0 - 7.5   Color, UA Yellow Yellow   Appearance Ur Clear Clear   Leukocytes,UA Negative Negative   Protein,UA Negative Negative/Trace   Glucose, UA Negative Negative   Ketones, UA Negative Negative   RBC, UA Trace (A) Negative   Bilirubin, UA Negative Negative   Urobilinogen, Ur 0.2 0.2 - 1.0 mg/dL   Nitrite, UA Negative Negative   Microscopic Examination See below:   Amylase  Result Value Ref Range   Amylase 55 31 - 110 U/L  CBC with Differential/Platelet  Result Value Ref Range   WBC 8.9 3.4 - 10.8 x10E3/uL   RBC 5.03 3.77 - 5.28 x10E6/uL   Hemoglobin 12.6 11.1 - 15.9 g/dL   Hematocrit 38.9 34.0 - 46.6 %   MCV 77 (L) 79 - 97 fL   MCH 25.0 (L) 26.6 - 33.0 pg   MCHC 32.4 31 - 35 g/dL   RDW 15.0 11.7 - 15.4 %   Platelets 421 150 - 450 x10E3/uL   Neutrophils 63 Not Estab. %   Lymphs 27 Not Estab. %   Monocytes 6 Not Estab. %   Eos 3 Not Estab. %   Basos 1 Not Estab. %   Neutrophils Absolute 5.6 1 - 7 x10E3/uL   Lymphocytes Absolute 2.4 0 - 3 x10E3/uL   Monocytes Absolute 0.6 0 - 0 x10E3/uL   EOS (ABSOLUTE) 0.3 0.0 - 0.4 x10E3/uL   Basophils Absolute 0.1 0 - 0 x10E3/uL   Immature Granulocytes 0 Not Estab. %   Immature Grans (Abs) 0.0 0.0 - 0.1 x10E3/uL  CMP14+EGFR  Result Value Ref Range   Glucose 101 (H) 65 - 99 mg/dL   BUN 13 6 - 20 mg/dL   Creatinine, Ser 0.76 0.57 - 1.00 mg/dL   GFR calc non Af Amer 101 >59 mL/min/1.73   GFR calc Af Amer 116 >59 mL/min/1.73   BUN/Creatinine Ratio 17 9 - 23   Sodium 140 134 - 144 mmol/L   Potassium 4.0 3.5 - 5.2 mmol/L   Chloride 104 96 - 106 mmol/L   CO2 23 20 - 29 mmol/L   Calcium 9.4 8.7 - 10.2 mg/dL   Total Protein 7.1 6.0 - 8.5 g/dL   Albumin 4.0 3.8 - 4.8 g/dL   Globulin, Total 3.1 1.5 - 4.5 g/dL   Albumin/Globulin Ratio 1.3 1.2 - 2.2    Bilirubin Total <0.2 0.0 - 1.2 mg/dL   Alkaline Phosphatase 107 48 - 121 IU/L   AST 6 0 - 40 IU/L   ALT 6 0 - 32 IU/L  Lipase  Result Value Ref Range  Lipase 20 14 - 72 U/L     Assessment & Plan:   Diagnoses and all orders for this visit:  Flank pain -     Urinalysis, Complete -     DG Abd 1 View  Labs ordered, resulted above.  Observation seems appropriate at this time.  She will follow-up over the next 48 hours should symptoms not resolve, sooner if they worsen.     I am having [name] maintain her ELDERBERRY PO, multivitamin with minerals, Vitamin C, melatonin, azelastine, traZODone, ProAir RespiClick, budesonide-formoterol, fluticasone, omeprazole, Advair HFA, Digestive Enzymes, Probiotic Product (PROBIOTIC DAILY PO), atomoxetine, levocetirizine, buPROPion, and amLODipine.  Allergies as of 07/15/2019      Reactions   Other Anaphylaxis   Reaction to hay (something in [place] pig's cage), dog and mice   Dust Mite Extract    Lavender Oil Hives   Mold Extract [trichophyton]       Medication List       Accurate as of July 15, 2019  8:34 AM. If you have any questions, ask your nurse or doctor.        Advair HFA 115-21 MCG/ACT inhaler Generic drug: fluticasone-salmeterol Inhale 2 puffs into the lungs 2 (two) times daily.   amLODipine 5 MG tablet Commonly known as: NORVASC Take 1 tablet (5 mg total) by mouth daily. (Needs to be seen before next refill)   atomoxetine 40 MG capsule Commonly known as: Strattera Take 1 capsule (40 mg total) by mouth daily.   azelastine 0.1 % nasal spray Commonly known as: ASTELIN Place 2 sprays into both nostrils daily. Use in each nostril as directed What changed:   when to take this  reasons to take this   budesonide-formoterol 160-4.5 MCG/ACT inhaler Commonly known as: Symbicort Inhale 2 puffs into the lungs 2 (two) times daily.   buPROPion 150 MG 24 hr tablet Commonly known as: WELLBUTRIN XL TAKE 1 TABLET (150 MG  TOTAL) BY MOUTH DAILY.   Digestive Enzymes Tabs Take by mouth 2 (two) times daily.   ELDERBERRY PO Take 1 tablet by mouth as needed.   fluticasone 50 MCG/ACT nasal spray Commonly known as: FLONASE Place 1 spray into both nostrils daily.   levocetirizine 5 MG tablet Commonly known as: XYZAL TAKE 1 TABLET BY MOUTH EVERY EVENING.   melatonin 5 MG Tabs Take by mouth as needed.   multivitamin with minerals Tabs tablet Take 1 tablet by mouth daily.   omeprazole 20 MG capsule Commonly known as: PRILOSEC Take 1 capsule (20 mg total) by mouth 2 (two) times daily.   ProAir RespiClick 741 (90 Base) MCG/ACT Aepb Generic drug: Albuterol Sulfate Inhale 1 puff into the lungs every 6 (six) hours as needed.   PROBIOTIC DAILY PO Take by mouth daily.   traZODone 50 MG tablet Commonly known as: DESYREL Take 0.5-1 tablets (25-50 mg total) by mouth at bedtime as needed for sleep.   Vitamin C Chew Chew 2-3 tablets by mouth daily.        Follow-up: No follow-ups on file.  Claretta Fraise, M.D.

## 2019-07-18 ENCOUNTER — Encounter: Payer: Self-pay | Admitting: Family Medicine

## 2019-07-29 ENCOUNTER — Other Ambulatory Visit: Payer: Self-pay

## 2019-07-29 ENCOUNTER — Encounter: Payer: Self-pay | Admitting: Family Medicine

## 2019-07-29 ENCOUNTER — Ambulatory Visit (INDEPENDENT_AMBULATORY_CARE_PROVIDER_SITE_OTHER): Payer: No Typology Code available for payment source | Admitting: Family Medicine

## 2019-07-29 DIAGNOSIS — I10 Essential (primary) hypertension: Secondary | ICD-10-CM

## 2019-07-29 DIAGNOSIS — K219 Gastro-esophageal reflux disease without esophagitis: Secondary | ICD-10-CM

## 2019-07-29 MED ORDER — AMLODIPINE BESYLATE 5 MG PO TABS: 5.0000 mg | ORAL_TABLET | Freq: Every day | ORAL | 3 refills | Status: DC

## 2019-07-29 NOTE — Progress Notes (Signed)
BP 124/88   Pulse 81   Ht _0  (1.676 m)   Wt 280 lb (127 kg)   LMP 07/15/2019   BMI 45.19 kg/m    Subjective:   Patient ID: Molly [name], female    DOB: December 03, 1980, 39 y.o.   MRN: [id number]  HPI: Molly [name] is a 39 y.o. female presenting on 07/29/2019 for Medical Management of Chronic Issues and Hypertension   HPI Hypertension Patient is currently on amlodipine, and their blood pressure today is 124/88. Patient denies any lightheadedness or dizziness. Patient denies headaches, blurred vision, chest pains, shortness of breath, or weakness. Denies any side effects from medication and is content with current medication.   Patient's hypertension are more complicated by the patient's morbid obesity.  Discussed weight loss and lifestyle modification and exercise with the patient.   Patient is also coming in for recheck of anxiety today.  She says she is doing well on Wellbutrin and her mood is doing great she will continue with that  GERD Patient is currently on no medication currently.  She denies any major symptoms or abdominal pain or belching or burping. She denies any blood in her stool or lightheadedness or dizziness.   Relevant past medical, surgical, family and social history reviewed and updated as indicated. Interim medical history since our last visit reviewed. Allergies and medications reviewed and updated.  Review of Systems  Constitutional: Negative for chills and fever.  Eyes: Negative for visual disturbance.  Respiratory: Negative for chest tightness and shortness of breath.   Cardiovascular: Negative for chest pain and leg swelling.  Genitourinary: Negative for difficulty urinating and dysuria.  Musculoskeletal: Negative for back pain and gait problem.  Skin: Negative for rash.  Neurological: Negative for light-headedness and headaches.  Psychiatric/Behavioral: Negative for agitation and behavioral problems.  All other systems reviewed and are  negative.   Per HPI unless specifically indicated above   Allergies as of 07/29/2019      Reactions   Other Anaphylaxis   Reaction to hay (something in [place] pig's cage), dog and mice   Dust Mite Extract    Lavender Oil Hives   Mold Extract [trichophyton]       Medication List       Accurate as of July 29, 2019  4:33 PM. If you have any questions, ask your nurse or doctor.        STOP taking these medications   atomoxetine 40 MG capsule Commonly known as: Strattera Stopped by: Worthy Rancher, MD   omeprazole 20 MG capsule Commonly known as: PRILOSEC Stopped by: Worthy Rancher, MD     TAKE these medications   Advair HFA 115-21 MCG/ACT inhaler Generic drug: fluticasone-salmeterol Inhale 2 puffs into the lungs 2 (two) times daily.   amLODipine 5 MG tablet Commonly known as: NORVASC Take 1 tablet (5 mg total) by mouth daily. What changed: additional instructions Changed by: Fransisca Kaufmann Cuca Benassi, MD   azelastine 0.1 % nasal spray Commonly known as: ASTELIN Place 2 sprays into both nostrils daily. Use in each nostril as directed What changed:   when to take this  reasons to take this   budesonide-formoterol 160-4.5 MCG/ACT inhaler Commonly known as: Symbicort Inhale 2 puffs into the lungs 2 (two) times daily.   buPROPion 150 MG 24 hr tablet Commonly known as: WELLBUTRIN XL TAKE 1 TABLET (150 MG TOTAL) BY MOUTH DAILY.   Digestive Enzymes Tabs Take by mouth 2 (two) times daily.   ELDERBERRY PO Take  1 tablet by mouth as needed.   fluticasone 50 MCG/ACT nasal spray Commonly known as: FLONASE Place 1 spray into both nostrils daily.   levocetirizine 5 MG tablet Commonly known as: XYZAL TAKE 1 TABLET BY MOUTH EVERY EVENING.   melatonin 5 MG Tabs Take by mouth as needed.   multivitamin with minerals Tabs tablet Take 1 tablet by mouth daily.   ProAir RespiClick 287 (90 Base) MCG/ACT Aepb Generic drug: Albuterol Sulfate Inhale 1 puff into the  lungs every 6 (six) hours as needed.   PROBIOTIC DAILY PO Take by mouth daily.   traZODone 50 MG tablet Commonly known as: DESYREL Take 0.5-1 tablets (25-50 mg total) by mouth at bedtime as needed for sleep.   Vitamin C Chew Chew 2-3 tablets by mouth daily.        Objective:   BP 124/88   Pulse 81   Ht _0  (1.676 m)   Wt 280 lb (127 kg)   LMP 07/15/2019   BMI 45.19 kg/m   Wt Readings from Last 3 Encounters:  07/29/19 280 lb (127 kg)  07/15/19 275 lb 8 oz (125 kg)  03/24/19 284 lb (128.8 kg)    Physical Exam Vitals and nursing note reviewed.  Constitutional:      General: She is not in acute distress.    Appearance: She is well-developed. She is not diaphoretic.  Eyes:     Conjunctiva/sclera: Conjunctivae normal.  Cardiovascular:     Rate and Rhythm: Normal rate and regular rhythm.     Heart sounds: Normal heart sounds. No murmur heard.   Pulmonary:     Effort: Pulmonary effort is normal. No respiratory distress.     Breath sounds: Normal breath sounds. No wheezing.  Musculoskeletal:        General: No tenderness. Normal range of motion.  Skin:    General: Skin is warm and dry.     Findings: No rash.  Neurological:     Mental Status: She is alert and oriented to person, place, and time.     Coordination: Coordination normal.  Psychiatric:        Behavior: Behavior normal.     Results for orders placed or performed in visit on 07/15/19  Microscopic Examination   Urine  Result Value Ref Range   WBC, UA 0-5 0 - 5 /hpf   RBC 3-10 (A) 0 - 2 /hpf   Epithelial Cells (non renal) >10 (A) 0 - 10 /hpf   Renal Epithel, UA None seen None seen /hpf   Mucus, UA Present Not Estab.   Bacteria, UA Moderate (A) None seen/Few  Urinalysis, Complete  Result Value Ref Range   Specific Gravity, UA >1.030 (H) 1.005 - 1.030   pH, UA 5.5 5.0 - 7.5   Color, UA Yellow Yellow   Appearance Ur Clear Clear   Leukocytes,UA Negative Negative   Protein,UA Negative  Negative/Trace   Glucose, UA Negative Negative   Ketones, UA Negative Negative   RBC, UA Trace (A) Negative   Bilirubin, UA Negative Negative   Urobilinogen, Ur 0.2 0.2 - 1.0 mg/dL   Nitrite, UA Negative Negative   Microscopic Examination See below:   Amylase  Result Value Ref Range   Amylase 55 31 - 110 U/L  CBC with Differential/Platelet  Result Value Ref Range   WBC 8.9 3.4 - 10.8 x10E3/uL   RBC 5.03 3.77 - 5.28 x10E6/uL   Hemoglobin 12.6 11.1 - 15.9 g/dL   Hematocrit 38.9 34.0 - 46.6 %  MCV 77 (L) 79 - 97 fL   MCH 25.0 (L) 26.6 - 33.0 pg   MCHC 32.4 31 - 35 g/dL   RDW 15.0 11.7 - 15.4 %   Platelets 421 150 - 450 x10E3/uL   Neutrophils 63 Not Estab. %   Lymphs 27 Not Estab. %   Monocytes 6 Not Estab. %   Eos 3 Not Estab. %   Basos 1 Not Estab. %   Neutrophils Absolute 5.6 1 - 7 x10E3/uL   Lymphocytes Absolute 2.4 0 - 3 x10E3/uL   Monocytes Absolute 0.6 0 - 0 x10E3/uL   EOS (ABSOLUTE) 0.3 0.0 - 0.4 x10E3/uL   Basophils Absolute 0.1 0 - 0 x10E3/uL   Immature Granulocytes 0 Not Estab. %   Immature Grans (Abs) 0.0 0.0 - 0.1 x10E3/uL  CMP14+EGFR  Result Value Ref Range   Glucose 101 (H) 65 - 99 mg/dL   BUN 13 6 - 20 mg/dL   Creatinine, Ser 0.76 0.57 - 1.00 mg/dL   GFR calc non Af Amer 101 >59 mL/min/1.73   GFR calc Af Amer 116 >59 mL/min/1.73   BUN/Creatinine Ratio 17 9 - 23   Sodium 140 134 - 144 mmol/L   Potassium 4.0 3.5 - 5.2 mmol/L   Chloride 104 96 - 106 mmol/L   CO2 23 20 - 29 mmol/L   Calcium 9.4 8.7 - 10.2 mg/dL   Total Protein 7.1 6.0 - 8.5 g/dL   Albumin 4.0 3.8 - 4.8 g/dL   Globulin, Total 3.1 1.5 - 4.5 g/dL   Albumin/Globulin Ratio 1.3 1.2 - 2.2   Bilirubin Total <0.2 0.0 - 1.2 mg/dL   Alkaline Phosphatase 107 48 - 121 IU/L   AST 6 0 - 40 IU/L   ALT 6 0 - 32 IU/L  Lipase  Result Value Ref Range   Lipase 20 14 - 72 U/L    Assessment & Plan:   Problem List Items Addressed This Visit      Cardiovascular and Mediastinum   Essential hypertension    Relevant Medications   amLODipine (NORVASC) 5 MG tablet     Digestive   GERD (gastroesophageal reflux disease)     Other   Morbid obesity (HCC) - Primary   Relevant Orders   Lipid panel   TSH      Will check TSH and lipid, just had blood work done a week ago, she was having probably a kidney stone but it is passed and she is feeling better now. Follow up plan: Return in about 6 months (around 01/29/2020), or if symptoms worsen or fail to improve, for Well woman exam and physical.  Counseling provided for all of the vaccine components No orders of the defined types were placed in this encounter.   Caryl Pina, MD [facility] 07/29/2019, 4:33 PM

## 2019-07-30 LAB — LIPID PANEL
Chol/HDL Ratio: 2.7 ratio (ref 0.0–4.4)
Cholesterol, Total: 128 mg/dL (ref 100–199)
HDL: 47 mg/dL (ref >39)
LDL Chol Calc (NIH): 57 mg/dL (ref 0–99)
Triglycerides: 139 mg/dL (ref 0–149)
VLDL Cholesterol Cal: 24 mg/dL (ref 5–40)

## 2019-07-30 LAB — TSH: TSH: 3.4 u[IU]/mL (ref 0.450–4.500)

## 2019-07-30 MED FILL — AMLODIPINE BESYLATE 5 MG TA: 5 | 90 days supply | Qty: 90 | Fill #0

## 2019-08-09 ENCOUNTER — Encounter: Payer: Self-pay | Admitting: Family Medicine

## 2019-08-13 MED FILL — buPROPion HCL ER (XL) 150 M: 150 | 90 days supply | Qty: 90 | Fill #1

## 2019-08-13 MED FILL — LEVOCETIRIZINE 5 MG TABLET: 5 | 30 days supply | Qty: 30 | Fill #2

## 2019-08-16 MED FILL — AMLODIPINE BESYLATE 5 MG TA: 5 | 90 days supply | Qty: 90 | Fill #0

## 2019-08-25 ENCOUNTER — Other Ambulatory Visit: Payer: Self-pay

## 2019-08-25 ENCOUNTER — Ambulatory Visit: Payer: No Typology Code available for payment source | Admitting: Allergy & Immunology

## 2019-08-25 ENCOUNTER — Encounter: Payer: Self-pay | Admitting: Allergy & Immunology

## 2019-08-25 VITALS — BP 128/82 | HR 83 | Resp 18

## 2019-08-25 DIAGNOSIS — J4540 Moderate persistent asthma, uncomplicated: Secondary | ICD-10-CM

## 2019-08-25 DIAGNOSIS — J3089 Other allergic rhinitis: Secondary | ICD-10-CM

## 2019-08-25 DIAGNOSIS — J302 Other seasonal allergic rhinitis: Secondary | ICD-10-CM | POA: Diagnosis not present

## 2019-08-25 NOTE — Progress Notes (Signed)
FOLLOW UP  Date of Service/Encounter:  08/25/19   Assessment:   Moderate persistent asthma, uncomplicated  Perennial and seasonal allergic rhinitis(dust mite, cockroach, grasses, trees, and ragweed)  Vaccine hesitancy - but no allergies to PEG or polysorbate   Plan/Recommendations:   1. Moderate persistent asthma, uncomplicated - Lung testing looks amazing today. - We are not going to make any medication changes at this time.  - Daily controller medication(s): Symbicort 160/4.30mcg two puffs twice daily with spacer - Prior to physical activity: ProAir 2 puffs 10-15 minutes before physical activity. - Rescue medications: ProAir 4 puffs every 4-6 hours as needed - Asthma control goals:  * Full participation in all desired activities (may need albuterol before activity) * Albuterol use two time or less a week on average (not counting use with activity) * Cough interfering with sleep two time or less a month * Oral steroids no more than once a year * No hospitalizations  2. Chronic rhinitis (dust mite, cockroach, grasses, trees, and ragweed) - We will not make any medication changes at this time.   - Continue taking: Dymista (split up into fluticasone and azelastine) two sprays per nostril 1-2 times daily as needed (generic available now) and Xyzal 5mg  daily  - You can use an extra dose of the antihistamine, if needed, for breakthrough symptoms.  - Consider nasal saline rinses 1-2 times daily to remove allergens from the nasal cavities as well as help with mucous clearance (this is especially helpful to do before the nasal sprays are given).   3. Return in about 6 months (around 02/25/2020). This can be an in-person, a virtual Webex or a telephone follow up visit.   Subjective:   Molly [name] is a 39 y.o. female presenting today for follow up of  Chief Complaint  Patient presents with   Asthma    Molly [name] has a history of the following: Patient Active Problem List    Diagnosis Date Noted   History of colitis 02/25/2019   GERD (gastroesophageal reflux disease) 11/16/2018   Severe obstructive sleep apnea-hypopnea syndrome 09/22/2018   Chronic intermittent hypoxia with obstructive sleep apnea 09/22/2018   Essential hypertension 03/02/2018   Morbid obesity ([place]) 02/02/2018   Moderate persistent asthma, uncomplicated 16/96/7893   Non-seasonal allergic rhinitis due to pollen 12/31/2017   Generalized anxiety disorder 06/17/2017    History obtained from: chart review and patient.  Molly [name] is a 39 y.o. female presenting for a follow up visit. She was last seen in February 2021. At that time, her lung testing looked excellent. We did not make any medication changes. We continued with high dose Symbicort two puffs BID with albuterol as needed. For her rhinitis, we continued with Dymista two sprays per nostril up to twice daily as well as Xyzal. She was hesitant to the [place] vaccination at the last visit and we discussed this extensively.   Since the last visit, she has done very well.  Asthma/Respiratory Symptom History: She remains on the Symbhicort two puffs in the morning and two puffs at night. She has not had any asthma exacerbations and has not been to the ED or required prednisone. ACT score is 22, indicating excellent asthma control. She reports good sleeping at night.   Allergic Rhinitis Symptom History: She does the fluticasone and the levocetirizine every day, but the azelastine as needed. She has not needed antibiotics at all. She reports feeling fairly good for the most part.   She is still on the fence about  getting the vaccination, but will go ahead with it since it is now required in the health system. 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 She is leaning towards [employer]. She is not an anti-vaxer. She is working at [employer] now.   Otherwise, there have been no changes to her past medical  history, surgical history, family history, or social history.   Review of Systems  Constitutional: Negative.  Negative for chills, fever, malaise/fatigue and weight loss.  HENT: Negative for congestion, ear discharge, ear pain and sinus pain.   Eyes: Negative for pain, discharge and redness.  Respiratory: Negative for cough, sputum production, shortness of breath and wheezing.   Cardiovascular: Negative.  Negative for chest pain and palpitations.  Gastrointestinal: Negative for abdominal pain, constipation, diarrhea, heartburn, nausea and vomiting.  Skin: Negative.  Negative for itching and rash.  Neurological: Negative for dizziness and headaches.  Endo/Heme/Allergies: Positive for environmental allergies. Does not bruise/bleed easily.       Objective:   Blood pressure 128/82, pulse 83, resp. rate 18, SpO2 96 %. There is no height or weight on file to calculate BMI.   Physical Exam:  Physical Exam Constitutional:      Appearance: She is well-developed.     Comments: Very pleasant female. Cooperative with the exam. Very friendly and talkative.    HENT:     Head: Normocephalic and atraumatic.     Right Ear: Tympanic membrane, ear canal and external ear normal.     Left Ear: Tympanic membrane, ear canal and external ear normal.     Nose: No nasal deformity, septal deviation, mucosal edema or rhinorrhea.     Right Turbinates: Enlarged and swollen.     Left Turbinates: Enlarged and swollen.     Right Sinus: No maxillary sinus tenderness or frontal sinus tenderness.     Left Sinus: No maxillary sinus tenderness or frontal sinus tenderness.     Mouth/Throat:     Mouth: Mucous membranes are not pale and not dry.     Pharynx: Uvula midline.     Comments: Cobblestoning present in the posterior oropharynx.  Eyes:     General:        Right eye: No discharge.        Left eye: No discharge.     Conjunctiva/sclera: Conjunctivae normal.     Right eye: Right conjunctiva is not  injected. No chemosis.    Left eye: Left conjunctiva is not injected. No chemosis.    Pupils: Pupils are equal, round, and reactive to light.  Cardiovascular:     Rate and Rhythm: Normal rate and regular rhythm.     Heart sounds: Normal heart sounds.  Pulmonary:     Effort: Pulmonary effort is normal. No tachypnea, accessory muscle usage or respiratory distress.     Breath sounds: Normal breath sounds. No wheezing, rhonchi or rales.  Chest:     Chest wall: No tenderness.  Lymphadenopathy:     Cervical: No cervical adenopathy.  Skin:    Coloration: Skin is not pale.     Findings: No abrasion, erythema, petechiae or rash. Rash is not papular, urticarial or vesicular.     Comments: No eczematous or urticarial lesions noted.   Neurological:     Mental Status: She is alert.      Diagnostic studies:    Spirometry: results normal (FEV1: 2.86/82%, FVC: 3.15/78%, FEV1/FVC: 91%).    Spirometry consistent with normal pattern.   Allergy  Studies: none       Salvatore Marvel, MD  [facility]

## 2019-08-25 NOTE — Patient Instructions (Addendum)
1. Moderate persistent asthma, uncomplicated - Lung testing looks amazing today. - We are not going to make any medication changes at this time.  - Daily controller medication(s): Symbicort 160/4.34mcg two puffs twice daily with spacer - Prior to physical activity: ProAir 2 puffs 10-15 minutes before physical activity. - Rescue medications: ProAir 4 puffs every 4-6 hours as needed - Asthma control goals:  * Full participation in all desired activities (may need albuterol before activity) * Albuterol use two time or less a week on average (not counting use with activity) * Cough interfering with sleep two time or less a month * Oral steroids no more than once a year * No hospitalizations  2. Chronic rhinitis (dust mite, cockroach, grasses, trees, and ragweed) - We will not make any medication changes at this time.   - Continue taking: Dymista (split up into fluticasone and azelastine) two sprays per nostril 1-2 times daily as needed (generic available now) and Xyzal 5mg  daily  - You can use an extra dose of the antihistamine, if needed, for breakthrough symptoms.  - Consider nasal saline rinses 1-2 times daily to remove allergens from the nasal cavities as well as help with mucous clearance (this is especially helpful to do before the nasal sprays are given).   3. Return in about 6 months (around 02/25/2020). This can be an in-person, a virtual Webex or a telephone follow up visit.   Please inform us of any Emergency Department visits, hospitalizations, or changes in symptoms. Call us before going to the ED for breathing or allergy symptoms since we might be able to fit you in for a sick visit. Feel free to contact us anytime with any questions, problems, or concerns.  It was a pleasure to see you again today!  Websites that have reliable patient information: 1. American Academy of Asthma, Allergy, and Immunology: www.aaaai.org 2. Food Allergy Research and Education (FARE):  foodallergy.org 3. Mothers of Asthmatics: http://www.asthmacommunitynetwork.org 4. American College of Allergy, Asthma, and Immunology: www.acaai.org   COVID-19 Vaccine Information can be found at: ShippingScam.co.uk For questions related to vaccine distribution or appointments, please email vaccine@[email or web address] or call [phone].     "Like" [place] on Facebook and Instagram for our latest updates!        Make sure you are registered to vote! If you have moved or changed any of your contact information, you will need to get this updated before voting!  In some cases, you MAY be able to register to vote online: [email or web address]

## 2019-08-26 ENCOUNTER — Encounter: Payer: Self-pay | Admitting: Allergy & Immunology

## 2019-08-26 MED ORDER — PROAIR RESPICLICK 108 (90 BASE) MCG/ACT IN AEPB: 2.0000 | INHALATION_SPRAY | Freq: Four times a day (QID) | RESPIRATORY_TRACT | 1 refills | Status: DC | PRN

## 2019-08-26 MED ORDER — LEVOCETIRIZINE DIHYDROCHLORIDE 5 MG PO TABS: 5.0000 mg | ORAL_TABLET | Freq: Every evening | ORAL | 1 refills | Status: DC

## 2019-08-26 MED ORDER — BUDESONIDE-FORMOTEROL FUMARATE 160-4.5 MCG/ACT IN AERO: 2.0000 | INHALATION_SPRAY | Freq: Two times a day (BID) | RESPIRATORY_TRACT | 5 refills | Status: DC

## 2019-08-30 ENCOUNTER — Telehealth: Payer: Self-pay

## 2019-08-30 NOTE — Telephone Encounter (Signed)
Patients pharmacy called requesting a change of inhalers. Currently Symbicort will cost the patient $70. The patient could switch to Advair Disk for $5 if it is okay to switch.   Please call Hildred Alamin at [place] [phone].

## 2019-08-30 NOTE — Telephone Encounter (Signed)
Patients pharmacy called requesting a change of inhalers. Currently Symbicort will cost the patient $70. The patient could switch to Advair Disk for $5 if it is okay to switch  Dr, Ernst Bowler is it okay to switch her?

## 2019-08-31 ENCOUNTER — Other Ambulatory Visit: Payer: Self-pay

## 2019-08-31 MED ORDER — FLUTICASONE-SALMETEROL 250-50 MCG/DOSE IN AEPB
1.0000 | INHALATION_SPRAY | Freq: Two times a day (BID) | RESPIRATORY_TRACT | 3 refills | Status: DC
Start: 2019-08-31 — End: 2020-03-01

## 2019-08-31 MED FILL — SYMBICORT 160-4.5 MCG INH: 160-4.5 | 30 days supply | Qty: 10 | Fill #0

## 2019-08-31 NOTE — Telephone Encounter (Signed)
Called and left a message letting her know that the Advair has been sent to her pharmacy and for her to give [place] a callback for instructions on how to use it.

## 2019-08-31 NOTE — Telephone Encounter (Signed)
Sure we did change her to Advair 250/50 mcg1 puff twice daily.  Please make sure that she understands how to use the device.  Salvatore Marvel, MD [facility]

## 2019-09-02 MED FILL — ADVAIR 250/50 DISKUS: 250-50 | 30 days supply | Qty: 60 | Fill #0

## 2019-09-08 ENCOUNTER — Ambulatory Visit: Payer: No Typology Code available for payment source | Admitting: Gastroenterology

## 2019-09-26 ENCOUNTER — Encounter: Payer: Self-pay | Admitting: Allergy & Immunology

## 2019-09-27 ENCOUNTER — Other Ambulatory Visit: Payer: Self-pay

## 2019-09-27 ENCOUNTER — Ambulatory Visit
Admission: EM | Admit: 2019-09-27 | Discharge: 2019-09-27 | Disposition: A | Discharge status: Home / Self Care | Payer: No Typology Code available for payment source | Attending: Emergency Medicine | Admitting: Emergency Medicine

## 2019-09-27 DIAGNOSIS — Z1152 Encounter for screening for COVID-19: Secondary | ICD-10-CM | POA: Diagnosis not present

## 2019-09-27 DIAGNOSIS — J069 Acute upper respiratory infection, unspecified: Secondary | ICD-10-CM

## 2019-09-27 MED ORDER — BENZONATATE 100 MG PO CAPS
100.0000 mg | ORAL_CAPSULE | Freq: Three times a day (TID) | ORAL | 0 refills | Status: DC
Start: 2019-09-27 — End: 2020-03-01

## 2019-09-27 MED ORDER — PREDNISONE 10 MG PO TABS
20.0000 mg | ORAL_TABLET | Freq: Every day | ORAL | 0 refills | Status: DC
Start: 2019-09-27 — End: 2020-03-01

## 2019-09-27 NOTE — Discharge Instructions (Addendum)
COVID testing ordered.  It will take between 2-7 days for test results.  Someone will contact you regarding abnormal results.    In the meantime: You should remain isolated in your home for 10 days from symptom onset AND greater than 24 hours after symptoms resolution (absence of fever without the use of fever-reducing medication and improvement in respiratory symptoms), whichever is longer Get plenty of rest and push fluids Tessalon Perles prescribed for cough Continue to take proair as prescribed Prednisone was prescribed Use medications daily for symptom relief Use OTC medications like ibuprofen or tylenol as needed fever or pain Call or go to the ED if you have any new or worsening symptoms such as fever, worsening cough, shortness of breath, chest tightness, chest pain, turning blue, changes in mental status, etc..Marland Kitchen

## 2019-09-27 NOTE — ED Provider Notes (Signed)
[place]   [id number] 09/27/19 Arrival Time: 1502   CC: COVID symptoms  SUBJECTIVE: History from: patient.  Valma Rotenberg is a 39 y.o. female history of asthma presented to the urgent care with a complaint of cough, sore throat for the past few days.  Denies sick exposure to COVID, flu or strep.  Denies recent travel.  Has tried ProAir inhaler with mild relief.  Denies aggravating or alleviating factors.  Denies previous symptoms in the past.   Denies fever, chills, fatigue, sinus pain, rhinorrhea, sore throat, SOB, wheezing, chest pain, nausea, changes in bowel or bladder habits.     ROS: As per HPI.  All other pertinent ROS negative.      Past Medical History:  Diagnosis Date  . Anemia   . Colitis   . Colitis   . Hypertension   . Miscarriage   . Moderate persistent asthma, uncomplicated 57/26/2035  . Recurrent upper respiratory infection (URI)   . Sleep apnea    Past Surgical History:  Procedure Laterality Date  . CESAREAN SECTION    . COLONOSCOPY  07/15/2002   [facility]; normal exam except for short segment of the distal sigmoid and rectosigmoid from 15 cm to 25 cm.  Biopsies taken from this area as well as transverse and descending colon.  Pathology of a sending in descending colon benign.  Mild interstitial congestion and edema in sigmoid colon.  Focal acute and chronic colitis, nonspecific in the distal sigmoid and rectum.    Allergies  Allergen Reactions  . Other Anaphylaxis    Reaction to hay (something in [place] pig's cage), dog and mice  . Dust Mite Extract   . Lavender Oil Hives  . Mold Extract [Trichophyton]    No current facility-administered medications on file prior to encounter.   Current Outpatient Medications on File Prior to Encounter  Medication Sig Dispense Refill  . Albuterol Sulfate (PROAIR RESPICLICK) 597 (90 Base) MCG/ACT AEPB Inhale 2 puffs into the lungs every 6 (six) hours as needed. 1 each 1  . amLODipine  (NORVASC) 5 MG tablet Take 1 tablet (5 mg total) by mouth daily. 90 tablet 3  . azelastine (ASTELIN) 0.1 % nasal spray Place 2 sprays into both nostrils daily. Use in each nostril as directed (Patient taking differently: Place 2 sprays into both nostrils as needed. Use in each nostril as directed) 30 mL 3  . Bioflavonoid Products (VITAMIN C) CHEW Chew 2-3 tablets by mouth daily.    . budesonide-formoterol (SYMBICORT) 160-4.5 MCG/ACT inhaler Inhale 2 puffs into the lungs 2 (two) times daily. 1 Inhaler 5  . buPROPion (WELLBUTRIN XL) 150 MG 24 hr tablet TAKE 1 TABLET (150 MG TOTAL) BY MOUTH DAILY. 90 tablet 1  . Digestive Enzymes TABS Take by mouth 2 (two) times daily.    Marland Kitchen ELDERBERRY PO Take 1 tablet by mouth as needed.     . fluticasone (FLONASE) 50 MCG/ACT nasal spray Place 1 spray into both nostrils daily. 16 g 3  . Fluticasone-Salmeterol (ADVAIR DISKUS) 250-50 MCG/DOSE AEPB Inhale 1 puff into the lungs 2 (two) times daily. 60 each 3  . fluticasone-salmeterol (ADVAIR HFA) 115-21 MCG/ACT inhaler Inhale 2 puffs into the lungs 2 (two) times daily. (Patient not taking: Reported on 08/25/2019) 1 Inhaler 5  . levocetirizine (XYZAL) 5 MG tablet Take 1 tablet (5 mg total) by mouth every evening. 90 tablet 1  . Melatonin 5 MG TABS Take by mouth as needed.     . Multiple Vitamin (  MULTIVITAMIN WITH MINERALS) TABS tablet Take 1 tablet by mouth daily.    . Probiotic Product (PROBIOTIC DAILY PO) Take by mouth daily.    . traZODone (DESYREL) 50 MG tablet Take 0.5-1 tablets (25-50 mg total) by mouth at bedtime as needed for sleep. 90 tablet 3   Social History   Socioeconomic History  . Marital status: Married    Spouse name: Not on file  . Number of children: Not on file  . Years of education: Not on file  . Highest education level: Not on file  Occupational History  . Not on file  Tobacco Use  . Smoking status: Former Smoker    Types: Cigarettes    Quit date: 02/15/2011    Years since quitting: 8.6  .  Smokeless tobacco: Never Used  Vaping Use  . Vaping Use: Never used  Substance and Sexual Activity  . Alcohol use: Yes    Alcohol/week: 1.0 standard drink    Types: 1 Cans of beer per week    Comment: occasional  . Drug use: No  . Sexual activity: Yes    Birth control/protection: None    Comment: married 6 years, trying to get pregnant  Other Topics Concern  . Not on file  Social History Narrative  . Not on file   Social Determinants of Health   Financial Resource Strain:   . Difficulty of Paying Living Expenses: Not on file  Food Insecurity:   . Worried About Charity fundraiser in the Last Year: Not on file  . Ran Out of Food in the Last Year: Not on file  Transportation Needs:   . Lack of Transportation (Medical): Not on file  . Lack of Transportation (Non-Medical): Not on file  Physical Activity:   . Days of Exercise per Week: Not on file  . Minutes of Exercise per Session: Not on file  Stress:   . Feeling of Stress : Not on file  Social Connections:   . Frequency of Communication with Friends and Family: Not on file  . Frequency of Social Gatherings with Friends and Family: Not on file  . Attends Religious Services: Not on file  . Active Member of Clubs or Organizations: Not on file  . Attends Archivist Meetings: Not on file  . Marital Status: Not on file  Intimate Partner Violence:   . Fear of Current or Ex-Partner: Not on file  . Emotionally Abused: Not on file  . Physically Abused: Not on file  . Sexually Abused: Not on file   Family History  Problem Relation Age of Onset  . Diabetes Mother   . Hypertension Mother   . Hyperlipidemia Mother   . Allergic rhinitis Mother   . Diabetes Maternal Grandmother   . Heart disease Maternal Grandmother   . Cancer Maternal Grandmother        sarcoma in arm  . Kidney disease Maternal Grandmother   . Allergic rhinitis Maternal Grandmother   . Heart disease Maternal Grandfather        congestive heart  failure  . Kidney disease Maternal Grandfather   . Cancer Maternal Grandfather        bladder  . Hypertension Maternal Grandfather   . Heart disease Paternal Grandmother   . Heart disease Paternal Grandfather   . COPD Paternal Grandfather   . Mental illness Paternal Grandfather        PTSD  . Colon cancer Other 45  . Asthma Neg Hx   .  Eczema Neg Hx   . Urticaria Neg Hx     OBJECTIVE:  Vitals:   09/27/19 1605  BP: (!) 130/97  Pulse: 82  Resp: 20  Temp: 98.9 F (37.2 C)  SpO2: 98%     General appearance: alert; appears fatigued, but nontoxic; speaking in full sentences and tolerating own secretions HEENT: NCAT; Ears: EACs clear, TMs pearly gray; Eyes: PERRL.  EOM grossly intact. Sinuses: nontender; Nose: nares patent without rhinorrhea, Throat: oropharynx clear, tonsils non erythematous or enlarged, uvula midline  Neck: supple without LAD Lungs: unlabored respirations, symmetrical air entry; cough: moderate; no respiratory distress; CTAB Heart: regular rate and rhythm.  Radial pulses 2+ symmetrical bilaterally Skin: warm and dry Psychological: alert and cooperative; normal mood and affect  LABS:  No results found for this or any previous visit (from the past 24 hour(s)).   ASSESSMENT & PLAN:  1. Viral URI   2. Encounter for screening for COVID-19     Meds ordered this encounter  Medications  . predniSONE (DELTASONE) 10 MG tablet    Sig: Take 2 tablets (20 mg total) by mouth daily.    Dispense:  15 tablet    Refill:  0  . benzonatate (TESSALON) 100 MG capsule    Sig: Take 1 capsule (100 mg total) by mouth every 8 (eight) hours.    Dispense:  30 capsule    Refill:  0    Discharge instructions    COVID testing ordered.  It will take between 2-7 days for test results.  Someone will contact you regarding abnormal results.    In the meantime: You should remain isolated in your home for 10 days from symptom onset AND greater than 24 hours after symptoms  resolution (absence of fever without the use of fever-reducing medication and improvement in respiratory symptoms), whichever is longer Get plenty of rest and push fluids Tessalon Perles prescribed for cough Continue to take proair as prescribed Prednisone was prescribed Use medications daily for symptom relief Use OTC medications like ibuprofen or tylenol as needed fever or pain Call or go to the ED if you have any new or worsening symptoms such as fever, worsening cough, shortness of breath, chest tightness, chest pain, turning blue, changes in mental status, etc...   Reviewed expectations re: course of current medical issues. Questions answered. Outlined signs and symptoms indicating need for more acute intervention. Patient verbalized understanding. After Visit Summary given.    Note: This document was prepared using Dragon voice recognition software and may include unintentional dictation errors.      Emerson Monte, FNP 09/27/19 1642

## 2019-09-27 NOTE — ED Triage Notes (Signed)
Pt presents with asthma flare that began a couple days ago

## 2019-09-29 MED ORDER — BREZTRI AEROSPHERE 160-9-4.8 MCG/ACT IN AERO: 2.0000 | INHALATION_SPRAY | Freq: Two times a day (BID) | RESPIRATORY_TRACT | 5 refills | Status: DC

## 2019-09-29 NOTE — Telephone Encounter (Signed)
I decided to try sending in Breztri to see if this was covered since this would allow a $0 copay card.   If she calls back and it is not covered, we can go back to Symbicort.=  I attempted to call the patient, but she did not answer.   Salvatore Marvel, MD [facility]

## 2019-09-30 ENCOUNTER — Other Ambulatory Visit: Payer: Self-pay | Admitting: *Deleted

## 2019-09-30 LAB — NOVEL CORONAVIRUS, NAA: SARS-CoV-2, NAA: NOT DETECTED

## 2019-09-30 MED ORDER — BREZTRI AEROSPHERE 160-9-4.8 MCG/ACT IN AERO: 2.0000 | INHALATION_SPRAY | Freq: Two times a day (BID) | RESPIRATORY_TRACT | 5 refills | Status: DC

## 2019-09-30 MED FILL — BREZTRI AEROSPHERE 160-9-4.: 160-9-4.8 | 30 days supply | Qty: 11 | Fill #0

## 2019-09-30 NOTE — Telephone Encounter (Signed)
Patient would like Breztri inhaler sent to [facility]. Patient also states she was going to be emailed a coupon in case Molly [name] was not covered but she did not get it. Patient's email is [email or web address].  Please advise.

## 2019-10-12 MED FILL — LEVOCETIRIZINE 5 MG TABLET: 5 | 90 days supply | Qty: 90 | Fill #0

## 2019-11-24 ENCOUNTER — Encounter: Payer: Self-pay | Admitting: Family Medicine

## 2019-11-24 DIAGNOSIS — M722 Plantar fascial fibromatosis: Secondary | ICD-10-CM

## 2019-11-29 ENCOUNTER — Telehealth: Payer: Self-pay | Admitting: Family Medicine

## 2019-12-06 ENCOUNTER — Encounter: Payer: Self-pay | Admitting: Allergy & Immunology

## 2019-12-07 NOTE — Telephone Encounter (Signed)
Patient was diagnosed with covid and would like to know if she should be on any antibiotics due to her asthma. Patient does not have any respiratory issues at this time.  Please advise.

## 2019-12-09 ENCOUNTER — Other Ambulatory Visit (HOSPITAL_COMMUNITY): Payer: Self-pay | Admitting: Physician Assistant

## 2019-12-09 ENCOUNTER — Encounter: Payer: Self-pay | Admitting: Physician Assistant

## 2019-12-09 ENCOUNTER — Telehealth (HOSPITAL_COMMUNITY): Payer: Self-pay

## 2019-12-09 NOTE — Progress Notes (Signed)
I connected by phone with Molly [name] on 12/09/2019 at 3:14 PM to discuss the potential use of a new treatment for mild to moderate COVID-19 viral infection in non-hospitalized patients.  This patient is a 39 y.o. female that meets the FDA criteria for Emergency Use Authorization of COVID monoclonal antibody casirivimab/imdevimab, bamlanivimab/eteseviamb, or sotrovimab.  Has a (+) direct SARS-CoV-2 viral test result  Has mild or moderate COVID-19   Is NOT hospitalized due to COVID-19  Is within 10 days of symptom onset  Has at least one of the high risk factor(s) for progression to severe COVID-19 and/or hospitalization as defined in EUA.  Specific high risk criteria : BMI > 25 and Chronic Lung Disease   I have spoken and communicated the following to the patient or parent/caregiver regarding COVID monoclonal antibody treatment:  1. FDA has authorized the emergency use for the treatment of mild to moderate COVID-19 in adults and pediatric patients with positive results of direct SARS-CoV-2 viral testing who are 44 years of age and older weighing at least 40 kg, and who are at high risk for progressing to severe COVID-19 and/or hospitalization.  2. The significant known and potential risks and benefits of COVID monoclonal antibody, and the extent to which such potential risks and benefits are unknown.  3. Information on available alternative treatments and the risks and benefits of those alternatives, including clinical trials.  4. Patients treated with COVID monoclonal antibody should continue to self-isolate and use infection control measures (e.g., wear mask, isolate, social distance, avoid sharing personal items, clean and disinfect "high touch" surfaces, and frequent handwashing) according to CDC guidelines.   5. The patient or parent/caregiver has the option to accept or refuse COVID monoclonal antibody treatment.  After reviewing this information with the patient, the patient has  agreed to receive one of the available covid 19 monoclonal antibodies and will be provided an appropriate fact sheet prior to infusion. Molly Felix, PA-C 12/09/2019 3:14 PM

## 2019-12-09 NOTE — Telephone Encounter (Signed)
Called to Discuss with patient about Covid symptoms and the use of the monoclonal antibody infusion for those with mild to moderate Covid symptoms and at a high risk of hospitalization.     Pt appears to qualify for this infusion due to co-morbid conditions and/or a member of an at-risk group in accordance with the FDA Emergency Use Authorization.    Risk factors include BMI >25, asthma, and hypertension.  Sx onset 12/04/19. Tested positive at [facility] 12/05/19.  Explained costs of treatment to patient, given CPT & REV codes, and encouraged pt to call insurance company to verify cost that pt will be responsible for.   Pre-screened by RN and ready for APP to call and further discuss and/or schedule pt appt.

## 2019-12-10 ENCOUNTER — Other Ambulatory Visit (HOSPITAL_COMMUNITY): Payer: Self-pay

## 2019-12-10 ENCOUNTER — Ambulatory Visit (HOSPITAL_COMMUNITY)
Admission: RE | Admit: 2019-12-10 | Discharge: 2019-12-10 | Disposition: A | Discharge status: Home / Self Care | Payer: No Typology Code available for payment source | Source: Ambulatory Visit | Attending: Pulmonary Disease | Admitting: Pulmonary Disease

## 2019-12-10 DIAGNOSIS — U071 COVID-19: Secondary | ICD-10-CM | POA: Diagnosis not present

## 2019-12-10 DIAGNOSIS — J984 Other disorders of lung: Secondary | ICD-10-CM | POA: Insufficient documentation

## 2019-12-10 MED ORDER — SOTROVIMAB 500 MG/8ML IV SOLN
500.0000 mg | Freq: Once | INTRAVENOUS | Status: AC
  Administered 2019-12-10: 500 mg via INTRAVENOUS

## 2019-12-10 MED ORDER — FAMOTIDINE IN NACL 20-0.9 MG/50ML-% IV SOLN: 20.0000 mg | Freq: Once | INTRAVENOUS | Status: DC | PRN

## 2019-12-10 MED ORDER — SODIUM CHLORIDE 0.9 % IV SOLN: INTRAVENOUS | Status: DC | PRN

## 2019-12-10 MED ORDER — DIPHENHYDRAMINE HCL 50 MG/ML IJ SOLN: 50.0000 mg | Freq: Once | INTRAMUSCULAR | Status: DC | PRN

## 2019-12-10 MED ORDER — EPINEPHRINE 0.3 MG/0.3ML IJ SOAJ: 0.3000 mg | Freq: Once | INTRAMUSCULAR | Status: DC | PRN

## 2019-12-10 MED ORDER — METHYLPREDNISOLONE SODIUM SUCC 125 MG IJ SOLR: 125.0000 mg | Freq: Once | INTRAMUSCULAR | Status: DC | PRN

## 2019-12-10 MED ORDER — ALBUTEROL SULFATE HFA 108 (90 BASE) MCG/ACT IN AERS: 2.0000 | INHALATION_SPRAY | Freq: Once | RESPIRATORY_TRACT | Status: DC | PRN

## 2019-12-10 NOTE — Progress Notes (Signed)
Diagnosis: COVID-19  Physician: Dr. Asencion Noble  Procedure: [facility] Infusion [facility] Med: Sotrovimab infusion - Provided patient with sotrovimab fact sheet for patients, parents, and caregivers prior to infusion. Spoke to patient concerning administration fee of medication. Patient understands and wishes to continue infusion.   Complications: No immediate complications noted  Discharge: Discharged home  If after the infusion you have any questions or concerns please call the Advanced Practice Provider at [place] 12/10/2019

## 2019-12-10 NOTE — Discharge Instructions (Signed)

## 2019-12-17 ENCOUNTER — Other Ambulatory Visit: Payer: Self-pay

## 2019-12-17 ENCOUNTER — Ambulatory Visit
Admission: EM | Admit: 2019-12-17 | Discharge: 2019-12-17 | Disposition: A | Discharge status: Home / Self Care | Payer: No Typology Code available for payment source | Attending: Family Medicine | Admitting: Family Medicine

## 2019-12-17 ENCOUNTER — Ambulatory Visit (INDEPENDENT_AMBULATORY_CARE_PROVIDER_SITE_OTHER): Payer: No Typology Code available for payment source

## 2019-12-17 DIAGNOSIS — M25571 Pain in right ankle and joints of right foot: Secondary | ICD-10-CM | POA: Diagnosis not present

## 2019-12-17 DIAGNOSIS — W19XXXA Unspecified fall, initial encounter: Secondary | ICD-10-CM | POA: Diagnosis not present

## 2019-12-17 DIAGNOSIS — S93401A Sprain of unspecified ligament of right ankle, initial encounter: Secondary | ICD-10-CM | POA: Diagnosis not present

## 2019-12-17 MED ORDER — CYCLOBENZAPRINE HCL 10 MG PO TABS: 10.0000 mg | ORAL_TABLET | Freq: Two times a day (BID) | ORAL | 0 refills | Status: DC | PRN

## 2019-12-17 NOTE — Discharge Instructions (Signed)
Take ibuprofen as needed.  Rest and elevate your ankle. Apply ice packs 2-3 times a day for up to 20 minutes each.  Wear the brace as needed for comfort. Use the crutches as needed.  Follow up with your primary care provider or an orthopedist if you symptoms continue or worsen;  Or if you develop new symptoms, such as numbness, tingling, or weakness.

## 2019-12-17 NOTE — ED Provider Notes (Signed)
[place]   [id number] 12/17/19 Arrival Time: 1420  AO:ZHYQM PAIN  SUBJECTIVE: History from: patient. Annise Boran is a 39 y.o. female complains of right ankle pain and swelling that began today after slipping on leaves and falling. Rolled the right ankle laterally and heard a pop. Describes the pain as constant and achy in character. Has tried OTC medications without relief.  Symptoms are made worse with activity. Denies similar symptoms in the past. Denies fever, chills, weakness, numbness and tingling, saddle paresthesias, loss of bowel or bladder function.      ROS: As per HPI.  All other pertinent ROS negative.     Past Medical History:  Diagnosis Date  . Anemia   . Colitis   . Colitis   . Hypertension   . Miscarriage   . Moderate persistent asthma, uncomplicated 57/84/6962  . Recurrent upper respiratory infection (URI)   . Sleep apnea    Past Surgical History:  Procedure Laterality Date  . CESAREAN SECTION    . COLONOSCOPY  07/15/2002   [facility]; normal exam except for short segment of the distal sigmoid and rectosigmoid from 15 cm to 25 cm.  Biopsies taken from this area as well as transverse and descending colon.  Pathology of a sending in descending colon benign.  Mild interstitial congestion and edema in sigmoid colon.  Focal acute and chronic colitis, nonspecific in the distal sigmoid and rectum.    Allergies  Allergen Reactions  . Other Anaphylaxis    Reaction to hay (something in [place] pig's cage), dog and mice  . Dust Mite Extract   . Lavender Oil Hives  . Mold Extract [Trichophyton]    No current facility-administered medications on file prior to encounter.   Current Outpatient Medications on File Prior to Encounter  Medication Sig Dispense Refill  . Albuterol Sulfate (PROAIR RESPICLICK) 952 (90 Base) MCG/ACT AEPB Inhale 2 puffs into the lungs every 6 (six) hours as needed. 1 each 1  . amLODipine (NORVASC) 5 MG tablet Take 1  tablet (5 mg total) by mouth daily. 90 tablet 3  . azelastine (ASTELIN) 0.1 % nasal spray Place 2 sprays into both nostrils daily. Use in each nostril as directed (Patient taking differently: Place 2 sprays into both nostrils as needed. Use in each nostril as directed) 30 mL 3  . benzonatate (TESSALON) 100 MG capsule Take 1 capsule (100 mg total) by mouth every 8 (eight) hours. 30 capsule 0  . Bioflavonoid Products (VITAMIN C) CHEW Chew 2-3 tablets by mouth daily.    . Budeson-Glycopyrrol-Formoterol (BREZTRI AEROSPHERE) 160-9-4.8 MCG/ACT AERO Inhale 2 puffs into the lungs in the morning and at bedtime. 10.7 g 5  . budesonide-formoterol (SYMBICORT) 160-4.5 MCG/ACT inhaler Inhale 2 puffs into the lungs 2 (two) times daily. 1 Inhaler 5  . buPROPion (WELLBUTRIN XL) 150 MG 24 hr tablet TAKE 1 TABLET (150 MG TOTAL) BY MOUTH DAILY. 90 tablet 1  . Digestive Enzymes TABS Take by mouth 2 (two) times daily.    Marland Kitchen ELDERBERRY PO Take 1 tablet by mouth as needed.     . fluticasone (FLONASE) 50 MCG/ACT nasal spray Place 1 spray into both nostrils daily. 16 g 3  . Fluticasone-Salmeterol (ADVAIR DISKUS) 250-50 MCG/DOSE AEPB Inhale 1 puff into the lungs 2 (two) times daily. 60 each 3  . fluticasone-salmeterol (ADVAIR HFA) 115-21 MCG/ACT inhaler Inhale 2 puffs into the lungs 2 (two) times daily. (Patient not taking: Reported on 08/25/2019) 1 Inhaler 5  . levocetirizine (XYZAL)  5 MG tablet Take 1 tablet (5 mg total) by mouth every evening. 90 tablet 1  . Melatonin 5 MG TABS Take by mouth as needed.     . Multiple Vitamin (MULTIVITAMIN WITH MINERALS) TABS tablet Take 1 tablet by mouth daily.    . predniSONE (DELTASONE) 10 MG tablet Take 2 tablets (20 mg total) by mouth daily. 15 tablet 0  . Probiotic Product (PROBIOTIC DAILY PO) Take by mouth daily.    . traZODone (DESYREL) 50 MG tablet Take 0.5-1 tablets (25-50 mg total) by mouth at bedtime as needed for sleep. 90 tablet 3   Social History   Socioeconomic History  .  Marital status: Married    Spouse name: Not on file  . Number of children: Not on file  . Years of education: Not on file  . Highest education level: Not on file  Occupational History  . Not on file  Tobacco Use  . Smoking status: Former Smoker    Types: Cigarettes    Quit date: 02/15/2011    Years since quitting: 8.8  . Smokeless tobacco: Never Used  Vaping Use  . Vaping Use: Never used  Substance and Sexual Activity  . Alcohol use: Yes    Alcohol/week: 1.0 standard drink    Types: 1 Cans of beer per week    Comment: occasional  . Drug use: No  . Sexual activity: Yes    Birth control/protection: None    Comment: married 6 years, trying to get pregnant  Other Topics Concern  . Not on file  Social History Narrative  . Not on file   Social Determinants of Health   Financial Resource Strain:   . Difficulty of Paying Living Expenses: Not on file  Food Insecurity:   . Worried About Charity fundraiser in the Last Year: Not on file  . Ran Out of Food in the Last Year: Not on file  Transportation Needs:   . Lack of Transportation (Medical): Not on file  . Lack of Transportation (Non-Medical): Not on file  Physical Activity:   . Days of Exercise per Week: Not on file  . Minutes of Exercise per Session: Not on file  Stress:   . Feeling of Stress : Not on file  Social Connections:   . Frequency of Communication with Friends and Family: Not on file  . Frequency of Social Gatherings with Friends and Family: Not on file  . Attends Religious Services: Not on file  . Active Member of Clubs or Organizations: Not on file  . Attends Archivist Meetings: Not on file  . Marital Status: Not on file  Intimate Partner Violence:   . Fear of Current or Ex-Partner: Not on file  . Emotionally Abused: Not on file  . Physically Abused: Not on file  . Sexually Abused: Not on file   Family History  Problem Relation Age of Onset  . Diabetes Mother   . Hypertension Mother   .  Hyperlipidemia Mother   . Allergic rhinitis Mother   . Hypertension Father   . Sleep apnea Father   . Diabetes Maternal Grandmother   . Heart disease Maternal Grandmother   . Cancer Maternal Grandmother        sarcoma in arm  . Kidney disease Maternal Grandmother   . Allergic rhinitis Maternal Grandmother   . Heart disease Maternal Grandfather        congestive heart failure  . Kidney disease Maternal Grandfather   . Cancer  Maternal Grandfather        bladder  . Hypertension Maternal Grandfather   . Heart disease Paternal Grandmother   . Heart disease Paternal Grandfather   . COPD Paternal Grandfather   . Mental illness Paternal Grandfather        PTSD  . Colon cancer Other 38  . Asthma Neg Hx   . Eczema Neg Hx   . Urticaria Neg Hx     OBJECTIVE:  Vitals:   12/17/19 1502  BP: 119/85  Pulse: 80  Resp: 19  Temp: 98.4 F (36.9 C)  TempSrc: Oral  SpO2: 97%    General appearance: ALERT; in no acute distress.  Head: NCAT Lungs: Normal respiratory effort CV: XX pulses 2+ bilaterally. Cap refill < 2 seconds Musculoskeletal:  Inspection: Skin warm, dry, clear and intact. Erythema, effusion and ecchymosis to R lateral ankle Palpation: lateral aspect of R ankle very tender to palpation ROM: limited ROM active and passive to R ankle Skin: warm and dry Neurologic: Ambulates without difficulty; Sensation intact about the upper/ lower extremities Psychological: alert and cooperative; normal mood and affect  DIAGNOSTIC STUDIES:  No results found.   ASSESSMENT & PLAN:  1. Sprain of right ankle, unspecified ligament, initial encounter   2. Acute right ankle pain     Meds ordered this encounter  Medications  . cyclobenzaprine (FLEXERIL) 10 MG tablet    Sig: Take 1 tablet (10 mg total) by mouth 2 (two) times daily as needed for muscle spasms.    Dispense:  20 tablet    Refill:  0    Order Specific Question:   Supervising Provider    Answer:   Chase Picket  [[id number]]   Xray negative for fractures or misalignments ASO brace applied in office Crutches supplied in office Prescribed flexeril Continue conservative management of rest, ice, and gentle stretches Take ibuprofen as needed for pain relief (may cause abdominal discomfort, ulcers, and GI bleeds avoid taking with other NSAIDs) Take cyclobenzaprine at nighttime for symptomatic relief. Avoid driving or operating heavy machinery while using medication. Follow up with ortho if symptoms persist Return or go to the ER if you have any new or worsening symptoms (fever, chills, chest pain, abdominal pain, changes in bowel or bladder habits, pain radiating into lower legs)   Reviewed expectations re: course of current medical issues. Questions answered. Outlined signs and symptoms indicating need for more acute intervention. Patient verbalized understanding. After Visit Summary given.       Faustino Congress, NP 12/20/19 1321

## 2019-12-17 NOTE — ED Triage Notes (Signed)
Pt is here with a right ankle injury after stepping wrong and falling down a hill this happened 2 hours ago, pt has not taken any meds to relieve discomfort.

## 2020-01-31 ENCOUNTER — Encounter: Payer: No Typology Code available for payment source | Admitting: Family Medicine

## 2020-02-02 ENCOUNTER — Ambulatory Visit: Payer: No Typology Code available for payment source | Admitting: Family Medicine

## 2020-02-02 ENCOUNTER — Encounter: Payer: Self-pay | Admitting: Family Medicine

## 2020-02-02 VITALS — BP 150/105 | HR 83 | Ht 66.0 in | Wt 294.0 lb

## 2020-02-02 DIAGNOSIS — Z9989 Dependence on other enabling machines and devices: Secondary | ICD-10-CM | POA: Diagnosis not present

## 2020-02-02 DIAGNOSIS — G4733 Obstructive sleep apnea (adult) (pediatric): Secondary | ICD-10-CM | POA: Diagnosis not present

## 2020-02-02 NOTE — Patient Instructions (Addendum)
Please continue using your CPAP regularly. While your insurance requires that you use CPAP at least 4 hours each night on 70% of the nights, I recommend, that you not skip any nights and use it throughout the night if you can. Getting used to CPAP and staying with the treatment long term does take time and patience and discipline. Untreated obstructive sleep apnea when it is moderate to severe can have an adverse impact on cardiovascular health and raise her risk for heart disease, arrhythmias, hypertension, congestive heart failure, stroke and diabetes. Untreated obstructive sleep apnea causes sleep disruption, nonrestorative sleep, and sleep deprivation. This can have an impact on your day to day functioning and cause daytime sleepiness and impairment of cognitive function, memory loss, mood disturbance, and problems focussing. Using CPAP regularly can improve these symptoms.   I am so glad you are recovered. Sleep report looks great. Keep an eye on your BP at home. Follow up in 1 year   Sleep Apnea Sleep apnea affects breathing during sleep. It causes breathing to stop for a short time or to become shallow. It can also increase the risk of:  Heart attack.  Stroke.  Being very overweight (obese).  Diabetes.  Heart failure.  Irregular heartbeat. The goal of treatment is to help you breathe normally again. What are the causes? There are three kinds of sleep apnea:  Obstructive sleep apnea. This is caused by a blocked or collapsed airway.  Central sleep apnea. This happens when the brain does not send the right signals to the muscles that control breathing.  Mixed sleep apnea. This is a combination of obstructive and central sleep apnea. The most common cause of this condition is a collapsed or blocked airway. This can happen if:  Your throat muscles are too relaxed.  Your tongue and tonsils are too large.  You are overweight.  Your airway is too small.   What increases the  risk?  Being overweight.  Smoking.  Having a small airway.  Being older.  Being female.  Drinking alcohol.  Taking medicines to calm yourself (sedatives or tranquilizers).  Having family members with the condition. What are the signs or symptoms?  Trouble staying asleep.  Being sleepy or tired during the day.  Getting angry a lot.  Loud snoring.  Headaches in the morning.  Not being able to focus your mind (concentrate).  Forgetting things.  Less interest in sex.  Mood swings.  Personality changes.  Feelings of sadness (depression).  Waking up a lot during the night to pee (urinate).  Dry mouth.  Sore throat. How is this diagnosed?  Your medical history.  A physical exam.  A test that is done when you are sleeping (sleep study). The test is most often done in a sleep lab but may also be done at home. How is this treated?  Sleeping on your side.  Using a medicine to get rid of mucus in your nose (decongestant).  Avoiding the use of alcohol, medicines to help you relax, or certain pain medicines (narcotics).  Losing weight, if needed.  Changing your diet.  Not smoking.  Using a machine to open your airway while you sleep, such as: ? An oral appliance. This is a mouthpiece that shifts your lower jaw forward. ? A CPAP device. This device blows air through a mask when you breathe out (exhale). ? An EPAP device. This has valves that you put in each nostril. ? A BPAP device. This device blows air through a  mask when you breathe in (inhale) and breathe out.  Having surgery if other treatments do not work. It is important to get treatment for sleep apnea. Without treatment, it can lead to:  High blood pressure.  Coronary artery disease.  In men, not being able to have an erection (impotence).  Reduced thinking ability.   Follow these instructions at home: Lifestyle  Make changes that your doctor recommends.  Eat a healthy diet.  Lose  weight if needed.  Avoid alcohol, medicines to help you relax, and some pain medicines.  Do not use any products that contain nicotine or tobacco, such as cigarettes, e-cigarettes, and chewing tobacco. If you need help quitting, ask your doctor. General instructions  Take over-the-counter and prescription medicines only as told by your doctor.  If you were given a machine to use while you sleep, use it only as told by your doctor.  If you are having surgery, make sure to tell your doctor you have sleep apnea. You may need to bring your device with you.  Keep all follow-up visits as told by your doctor. This is important. Contact a doctor if:  The machine that you were given to use during sleep bothers you or does not seem to be working.  You do not get better.  You get worse. Get help right away if:  Your chest hurts.  You have trouble breathing in enough air.  You have an uncomfortable feeling in your back, arms, or stomach.  You have trouble talking.  One side of your body feels weak.  A part of your face is hanging down. These symptoms may be an emergency. Do not wait to see if the symptoms will go away. Get medical help right away. Call your local emergency services (911 in the [place] Do not drive yourself to the hospital. Summary  This condition affects breathing during sleep.  The most common cause is a collapsed or blocked airway.  The goal of treatment is to help you breathe normally while you sleep. This information is not intended to replace advice given to you by your health care provider. Make sure you discuss any questions you have with your health care provider. Document Revised: 10/24/2017 Document Reviewed: 09/02/2017 Elsevier Patient Education  [employer].

## 2020-02-02 NOTE — Progress Notes (Signed)
cpap orders sent via community message

## 2020-02-02 NOTE — Progress Notes (Signed)
PATIENT: Molly [name] DOB: [birth year]  REASON FOR VISIT: follow up HISTORY FROM: patient  Chief Complaint  Patient presents with  . Follow-up    RM 2 alone Pt is well, stopped talking all meds when she had covid in Nov. Sleep is great with CPAP     HISTORY OF PRESENT ILLNESS: Today 02/02/20  She returns today for follow-up on OSA.  She continues CPAP therapy.  She is doing very well.  She denies any concerns with her machine or supplies.  She has recently recovered from COVID-19 infection in November.  She reports that she did very well throughout her illness.  She was not hospitalized.  She is back to baseline.  She reports discontinuing all medications following COVID infection.  She has not monitored blood pressure readings at home.  She was previously taken amlodipine 5 mg daily.  She is asymptomatic today.  Compliance report dated 01/02/2020 through 01/31/2020 reveals that she is CPAP 30 of the past 30 days for compliance of 100%.  She used CPAP greater than 4 hours 29 of the past 30 days for compliance of 97%.  Average usage on days used was 7 hours and 45 minutes.  Residual AHI was 0.5 on a set pressure of 15 cm of water and an EPR of 3.  There was no significant leak noted.  History (copied form previous notes) 02/01/2019 Molly [name] is a 40 y.o. female here today for follow up for severe OSA with hypoxemia on CPAP.  Molly [name] is adjusting to CPAP therapy at home.  She does continue to have concerns of excessive bloating, abdominal pain and reflux.  She feels that the symptoms have worsened since starting CPAP.  We decreased set pressure from 16 cm of water to 15 cm of water.  She does report that this helped somewhat.  She also tried nasal pillows with chinstrap but did not feel that that was beneficial.  She is interested in trying a traditional full facemask.  She is taking Pepcid and Prilosec for control of reflux.  She is sleeping in an elevated position.  She is working swing  shifts.  She is working with her primary care provider to initiate work-up for bariatric surgery.  Compliance report dated 12/30/2018 through 01/28/2019 reveals that she used CPAP 30 of the last 30 days for compliance of 100%.  25 of those days she used CPAP greater than 4 hours for compliance of 83%.  Average usage was 7 hours.  Residual AHI was 1.6 on 15 cm of water and an EPR of 3.  There was no significant leak noted.   HISTORY: (copied from Dr Dohmeier's note on 03/17/2018)  HPI:  Molly [name] is a 40 y.o. female patient, seen on 2-25-20202 in  referral  from Dr. Warrick Parisian for sleep apnea.   Mrs. 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  The patient was diagnosed with asthma, had recurrent bronchitis.    Chief complaint according to patient :" I need a sleep study".   Sleep habits are as follows: He does not always dinnertime is usually around 7 PM, the patient retreats to bed by about 1030 on average.  She will no longer consume red wine for dinner as she had reflux and she keeps as time of at least 2 hours between meal and bedtime. She takes melatonin nightly and her usual average sleep latency is less than 30 minutes, In  a cool, quiet and dark bedroom.  She prefers to sleep on her side and on one pillow, with another between the legs.  The bed is not adjusted in hight. Nocturia times one. Can't remember her dreams.  Rises at 9 AM on week days, and working through the weekends , returning at 5 AM and she will rise at 12 noon.   Sleep medical history: morbid obesity- may be hiatal hernia, GERD, asthma.   Family sleep history: mother may have asthma, has OSA, has rhinitis, and sinusitis.    Social history:  Friday, Saturday and Sunday works until 4 AM. No exercise possibility, caretaker duties consume all time.  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   [employer] radiology  tech.  Former smoker quit Jan 25th 2013.  Alcohol - yes, but not red wine or spirits.    REVIEW OF SYSTEMS: Out of a complete 14 system review of symptoms, the patient complains only of the following symptoms, acid reflux and all other reviewed systems are negative.  Epworth sleepiness scale: 6 Fatigue severity scale: 20  ALLERGIES: Allergies  Allergen Reactions  . Other Anaphylaxis    Reaction to hay (something in [place] pig's cage), dog and mice  . Dust Mite Extract   . Lavender Oil Hives  . Mold Extract [Trichophyton]     HOME MEDICATIONS: Outpatient Medications Prior to Visit  Medication Sig Dispense Refill  . fluticasone (FLONASE) 50 MCG/ACT nasal spray Place 1 spray into both nostrils daily. 16 g 3  . Albuterol Sulfate (PROAIR RESPICLICK) 409 (90 Base) MCG/ACT AEPB Inhale 2 puffs into the lungs every 6 (six) hours as needed. (Patient not taking: Reported on 02/02/2020) 1 each 1  . amLODipine (NORVASC) 5 MG tablet Take 1 tablet (5 mg total) by mouth daily. (Patient not taking: Reported on 02/02/2020) 90 tablet 3  . azelastine (ASTELIN) 0.1 % nasal spray Place 2 sprays into both nostrils daily. Use in each nostril as directed (Patient not taking: Reported on 02/02/2020) 30 mL 3  . benzonatate (TESSALON) 100 MG capsule Take 1 capsule (100 mg total) by mouth every 8 (eight) hours. (Patient not taking: Reported on 02/02/2020) 30 capsule 0  . Bioflavonoid Products (VITAMIN C) CHEW Chew 2-3 tablets by mouth daily. (Patient not taking: Reported on 02/02/2020)    . Budeson-Glycopyrrol-Formoterol (BREZTRI AEROSPHERE) 160-9-4.8 MCG/ACT AERO Inhale 2 puffs into the lungs in the morning and at bedtime. (Patient not taking: Reported on 02/02/2020) 10.7 g 5  . budesonide-formoterol (SYMBICORT) 160-4.5 MCG/ACT inhaler Inhale 2 puffs into the lungs 2 (two) times daily. (Patient not taking: Reported on 02/02/2020) 1 Inhaler 5  . buPROPion (WELLBUTRIN XL) 150 MG 24 hr tablet TAKE 1 TABLET (150 MG TOTAL)  BY MOUTH DAILY. (Patient not taking: Reported on 02/02/2020) 90 tablet 1  . cyclobenzaprine (FLEXERIL) 10 MG tablet Take 1 tablet (10 mg total) by mouth 2 (two) times daily as needed for muscle spasms. (Patient not taking: Reported on 02/02/2020) 20 tablet 0  . Digestive Enzymes TABS Take by mouth 2 (two) times daily. (Patient not taking: Reported on 02/02/2020)    . ELDERBERRY PO Take 1 tablet by mouth as needed.  (Patient not taking: Reported on 02/02/2020)    . Fluticasone-Salmeterol (ADVAIR DISKUS) 250-50 MCG/DOSE AEPB Inhale 1 puff into the lungs 2 (two) times daily. (Patient not taking: Reported on 02/02/2020) 60 each 3  . fluticasone-salmeterol (ADVAIR HFA) 115-21 MCG/ACT inhaler Inhale 2 puffs into the lungs 2 (two)  times daily. (Patient not taking: No sig reported) 1 Inhaler 5  . levocetirizine (XYZAL) 5 MG tablet Take 1 tablet (5 mg total) by mouth every evening. (Patient not taking: Reported on 02/02/2020) 90 tablet 1  . Melatonin 5 MG TABS Take by mouth as needed.  (Patient not taking: Reported on 02/02/2020)    . Multiple Vitamin (MULTIVITAMIN WITH MINERALS) TABS tablet Take 1 tablet by mouth daily. (Patient not taking: Reported on 02/02/2020)    . predniSONE (DELTASONE) 10 MG tablet Take 2 tablets (20 mg total) by mouth daily. (Patient not taking: Reported on 02/02/2020) 15 tablet 0  . Probiotic Product (PROBIOTIC DAILY PO) Take by mouth daily. (Patient not taking: Reported on 02/02/2020)    . traZODone (DESYREL) 50 MG tablet Take 0.5-1 tablets (25-50 mg total) by mouth at bedtime as needed for sleep. (Patient not taking: Reported on 02/02/2020) 90 tablet 3   No facility-administered medications prior to visit.    PAST MEDICAL HISTORY: Past Medical History:  Diagnosis Date  . Anemia   . Colitis   . Colitis   . Hypertension   . Miscarriage   . Moderate persistent asthma, uncomplicated [id number]  . Recurrent upper respiratory infection (URI)   . Sleep apnea     PAST SURGICAL  HISTORY: Past Surgical History:  Procedure Laterality Date  . CESAREAN SECTION    . COLONOSCOPY  07/15/2002   [facility]; normal exam except for short segment of the distal sigmoid and rectosigmoid from 15 cm to 25 cm.  Biopsies taken from this area as well as transverse and descending colon.  Pathology of a sending in descending colon benign.  Mild interstitial congestion and edema in sigmoid colon.  Focal acute and chronic colitis, nonspecific in the distal sigmoid and rectum.     FAMILY HISTORY: Family History  Problem Relation Age of Onset  . Diabetes Mother   . Hypertension Mother   . Hyperlipidemia Mother   . Allergic rhinitis Mother   . Hypertension Father   . Sleep apnea Father   . Diabetes Maternal Grandmother   . Heart disease Maternal Grandmother   . Cancer Maternal Grandmother        sarcoma in arm  . Kidney disease Maternal Grandmother   . Allergic rhinitis Maternal Grandmother   . Heart disease Maternal Grandfather        congestive heart failure  . Kidney disease Maternal Grandfather   . Cancer Maternal Grandfather        bladder  . Hypertension Maternal Grandfather   . Heart disease Paternal Grandmother   . Heart disease Paternal Grandfather   . COPD Paternal Grandfather   . Mental illness Paternal Grandfather        PTSD  . Colon cancer Other 40  . Asthma Neg Hx   . Eczema Neg Hx   . Urticaria Neg Hx     SOCIAL HISTORY: Social History   Socioeconomic History  . Marital status: Married    Spouse name: Not on file  . Number of children: Not on file  . Years of education: Not on file  . Highest education level: Not on file  Occupational History  . Not on file  Tobacco Use  . Smoking status: Former Smoker    Types: Cigarettes    Quit date: 02/15/2011    Years since quitting: 8.9  . Smokeless tobacco: Never Used  Vaping Use  . Vaping Use: Never used  Substance and Sexual Activity  . Alcohol  use: Yes    Alcohol/week: 1.0 standard  drink    Types: 1 Cans of beer per week    Comment: occasional  . Drug use: No  . Sexual activity: Yes    Birth control/protection: None    Comment: married 6 years, trying to get pregnant  Other Topics Concern  . Not on file  Social History Narrative  . Not on file   Social Determinants of Health   Financial Resource Strain: Not on file  Food Insecurity: Not on file  Transportation Needs: Not on file  Physical Activity: Not on file  Stress: Not on file  Social Connections: Not on file  Intimate Partner Violence: Not on file      PHYSICAL EXAM  Vitals:   02/02/20 1319  BP: (!) 150/105  Pulse: 83  Weight: 294 lb (133.4 kg)  Height: 5\' 6"  (1.676 m)   Body mass index is 47.45 kg/m.  Generalized: Well developed, in no acute distress  Cardiology: normal rate and rhythm, no murmur noted Respiratory: Clear to auscultation bilaterally Neurological examination  Mentation: Alert oriented to time, place, history taking. Follows all commands speech and language fluent Cranial nerve II-XII: Pupils were equal round reactive to light. Extraocular movements were full, visual field were full  Motor: The motor testing reveals 5 over 5 strength of all 4 extremities. Good symmetric motor tone is noted throughout.  Gait and station: Gait is normal.   DIAGNOSTIC DATA (LABS, IMAGING, TESTING) - I reviewed patient records, labs, notes, testing and imaging myself where available.  No flowsheet data found.   Lab Results  Component Value Date   WBC 8.9 07/15/2019   HGB 12.6 07/15/2019   HCT 38.9 07/15/2019   MCV 77 (L) 07/15/2019   PLT 421 07/15/2019      Component Value Date/Time   NA 140 07/15/2019 0857   K 4.0 07/15/2019 0857   CL 104 07/15/2019 0857   CO2 23 07/15/2019 0857   GLUCOSE 101 (H) 07/15/2019 0857   GLUCOSE 105 (H) 01/02/2019 1849   BUN 13 07/15/2019 0857   CREATININE 0.76 07/15/2019 0857   CALCIUM 9.4 07/15/2019 0857   PROT 7.1 07/15/2019 0857   ALBUMIN 4.0  07/15/2019 0857   AST 6 07/15/2019 0857   ALT 6 07/15/2019 0857   ALKPHOS 107 07/15/2019 0857   BILITOT <0.2 07/15/2019 0857   GFRNONAA 101 07/15/2019 0857   GFRAA 116 07/15/2019 0857   Lab Results  Component Value Date   CHOL 128 07/29/2019   HDL 47 07/29/2019   LDLCALC 57 07/29/2019   TRIG 139 07/29/2019   CHOLHDL 2.7 07/29/2019   Lab Results  Component Value Date   HGBA1C 5.7 (H) 03/15/2019   Lab Results  Component Value Date   VITAMINB12 123 (L) 03/15/2019   Lab Results  Component Value Date   TSH 3.400 07/29/2019       ASSESSMENT AND PLAN 40 y.o. year old female  has a past medical history of Anemia, Colitis, Colitis, Hypertension, Miscarriage, Moderate persistent asthma, uncomplicated ([id number]), Recurrent upper respiratory infection (URI), and Sleep apnea. here with     ICD-10-CM   1. OSA on CPAP  G47.33    Z99.89      Natalee is doing very well, today.  CPAP report reveals excellent compliance.  She was encouraged to continue using CPAP nightly and for greater than 4 hours each night.  I have reviewed blood pressure reading with her in the office.  Fortunately, she is  feeling well with no symptoms.  I have encouraged her to follow-up with primary care.  She will consider resuming amlodipine pending blood pressure readings at home and discussion with primary care.  She will seek medical attention immediately for any red flag warnings.  Healthy lifestyle habits encouraged.  We will update CPAP supply orders.  She will follow-up with me in 1 year, sooner if needed.  She verbalizes understanding and agreement with this plan.   No orders of the defined types were placed in this encounter.    No orders of the defined types were placed in this encounter.     I spent 15 minutes with the patient. 50% of this time was spent counseling and educating patient on plan of care and medications.    Debbora Presto, FNP-C 02/02/2020, 1:38 PM [facility] [address] [place], [place] [address] [phone]

## 2020-03-01 ENCOUNTER — Ambulatory Visit (INDEPENDENT_AMBULATORY_CARE_PROVIDER_SITE_OTHER): Payer: No Typology Code available for payment source | Admitting: Allergy & Immunology

## 2020-03-01 ENCOUNTER — Other Ambulatory Visit: Payer: Self-pay

## 2020-03-01 ENCOUNTER — Encounter: Payer: Self-pay | Admitting: Allergy & Immunology

## 2020-03-01 VITALS — BP 132/96 | HR 71 | Temp 98.6°F | Ht 66.0 in | Wt 292.0 lb

## 2020-03-01 DIAGNOSIS — J4540 Moderate persistent asthma, uncomplicated: Secondary | ICD-10-CM | POA: Diagnosis not present

## 2020-03-01 DIAGNOSIS — J302 Other seasonal allergic rhinitis: Secondary | ICD-10-CM

## 2020-03-01 DIAGNOSIS — J3089 Other allergic rhinitis: Secondary | ICD-10-CM

## 2020-03-01 NOTE — Progress Notes (Signed)
FOLLOW UP  Date of Service/Encounter:  03/01/20   Assessment:   Moderate persistent asthma, uncomplicated - with worsening spirometric findings  Perennial and seasonal allergic rhinitis(dust mite, cockroach, grasses, trees, and ragweed)  Vaccine hesitancy - but no allergies to PEG or polysorbate  Concern for adverse reaction to amlodipine (time course not entirely clear) - introducing this back again to see if it worsens her cough  Plan/Recommendations:   1. Moderate persistent asthma, uncomplicated - Lung testing looks less stellar.  - But you are more than a number. - Hold off on the inhaled meds for now (until I get an update about the coughing) - Daily controller medication(s): NOTHING (for now) - Prior to physical activity: ProAir 2 puffs 10-15 minutes before physical activity. - Rescue medications: ProAir 4 puffs every 4-6 hours as needed - Asthma control goals:  * Full participation in all desired activities (may need albuterol before activity) * Albuterol use two time or less a week on average (not counting use with activity) * Cough interfering with sleep two time or less a month * Oral steroids no more than once a year * No hospitalizations  2. Chronic rhinitis (dust mite, cockroach, grasses, trees, and ragweed) - We will not make any medication changes at this time.   - Continue taking: Dymista (split up into fluticasone and azelastine) two sprays per nostril 1-2 times daily as needed (generic available now) and Xyzal 5mg  daily  - You can use an extra dose of the antihistamine, if needed, for breakthrough symptoms.  - Consider nasal saline rinses 1-2 times daily to remove allergens from the nasal cavities as well as help with mucous clearance (this is especially helpful to do before the nasal sprays are given).   3. Follow up in one month or earlier if needed.    Subjective:   Molly [name] is a 40 y.o. female presenting today for follow up of  Chief  Complaint  Patient presents with  . Follow-up    Molly [name] has a history of the following: Patient Active Problem List   Diagnosis Date Noted  . History of colitis 02/25/2019  . GERD (gastroesophageal reflux disease) 11/16/2018  . Severe obstructive sleep apnea-hypopnea syndrome 09/22/2018  . Chronic intermittent hypoxia with obstructive sleep apnea 09/22/2018  . Essential hypertension 03/02/2018  . Morbid obesity ([place]) 02/02/2018  . Moderate persistent asthma, uncomplicated 95/63/8756  . Non-seasonal allergic rhinitis due to pollen 12/31/2017  . Generalized anxiety disorder 06/17/2017    History obtained from: chart review and patient.  Molly [name] is a 40 y.o. female presenting for a follow up visit.  She was last seen in August 2021.  At that time, her lung testing was normal.  We continue with Symbicort 160 mcg 2 puffs twice daily with a spacer.  We also continue with albuterol as needed.  For her allergic rhinitis, would continue with Dymista as well as Xyzal 5 mg daily.  We did discuss the vaccine against COVID-19, but she was hesitant.  In the interim, we did change her to [place] in lieu of Symbicort.  Since last visit, she got [place] in November. She stopped her medications at that time and never restarted them.   Asthma/Respiratory Symptom History: She has not been back on [place]. She has had no problems with coughing at night. She uses her CPAP religiously. She has been on it for one year in November. This was started because she was waking up with headache constnatly.   Allergic Rhinitis  Symptom History: She is using her ftlucaisone daily. She is using the Xyzal two times per week. Depending on her symptoms, she will use Astelin as needed.   She started getting a hacky cough when she restarted the amlodipine. She noticed that she had a cough when she restarted it. She then stopped it and thought that maybe she had asthma symptoms that she attributed to the amlodipine. She  noticed this after a couple of days. She started coughing with it and then stopped the amlodipine. Coughing resolved. She has not restarted it.   Her history with amlodipine started around two years ago. She was consistently running the 431 systolics and she started amlodipine as a precaution. Review of the orders shows that this was started AFTER the coughing started that ended her with a diagnosis of asthma.   Otherwise, there have been no changes to her past medical history, surgical history, family history, or social history.    Review of Systems  Constitutional: Negative.  Negative for fever, malaise/fatigue and weight loss.  HENT: Negative.  Negative for congestion, ear discharge, ear pain and sore throat.   Eyes: Negative for pain, discharge and redness.  Respiratory: Positive for cough. Negative for sputum production, shortness of breath and wheezing.   Cardiovascular: Negative.  Negative for chest pain and palpitations.  Gastrointestinal: Negative for abdominal pain, constipation, diarrhea, heartburn, nausea and vomiting.  Skin: Negative.  Negative for itching and rash.  Neurological: Negative for dizziness and headaches.  Endo/Heme/Allergies: Negative for environmental allergies. Does not bruise/bleed easily.       Objective:   Blood pressure (!) 132/96, pulse 71, temperature 98.6 F (37 C), temperature source Temporal, height 5\' 6"  (1.676 m), weight 292 lb (132.5 kg), SpO2 97 %. Body mass index is 47.13 kg/m.   Physical Exam:  Physical Exam Constitutional:      Appearance: She is well-developed. She is obese.     Comments: Talkative.  HENT:     Head: Normocephalic and atraumatic.     Right Ear: Tympanic membrane, ear canal and external ear normal.     Left Ear: Tympanic membrane, ear canal and external ear normal.     Nose: No nasal deformity, septal deviation, mucosal edema, rhinorrhea or epistaxis.     Right Turbinates: Enlarged and swollen.     Left Turbinates:  Enlarged and swollen.     Right Sinus: No maxillary sinus tenderness or frontal sinus tenderness.     Left Sinus: No maxillary sinus tenderness or frontal sinus tenderness.     Mouth/Throat:     Mouth: Oropharynx is clear and moist. Mucous membranes are not pale and not dry.     Pharynx: Uvula midline.  Eyes:     General: Lids are normal. Allergic shiner present.        Right eye: No discharge.        Left eye: No discharge.     Extraocular Movements: EOM normal.     Conjunctiva/sclera: Conjunctivae normal.     Right eye: Right conjunctiva is not injected. No chemosis.    Left eye: Left conjunctiva is not injected. No chemosis.    Pupils: Pupils are equal, round, and reactive to light.  Cardiovascular:     Rate and Rhythm: Normal rate and regular rhythm.     Heart sounds: Normal heart sounds.  Pulmonary:     Effort: Pulmonary effort is normal. No tachypnea, accessory muscle usage or respiratory distress.     Breath sounds: Normal breath sounds.  No wheezing, rhonchi or rales.     Comments: Somewhat decreased air movement at the bases. Chest:     Chest wall: No tenderness.  Lymphadenopathy:     Cervical: No cervical adenopathy.  Skin:    General: Skin is warm.     Capillary Refill: Capillary refill takes less than 2 seconds.     Coloration: Skin is not pale.     Findings: No abrasion, erythema, petechiae or rash. Rash is not papular, urticarial or vesicular.     Comments: No eczematous or urticarial lesions noted.   Neurological:     Mental Status: She is alert.  Psychiatric:        Mood and Affect: Mood and affect normal.      Diagnostic studies:    Spirometry: results abnormal (FEV1: 2.47/76%, FVC: 2.83/71%, FEV1/FVC: 87%).    Spirometry consistent with possible restrictive disease.   Allergy Studies: none        Salvatore Marvel, MD  [facility]

## 2020-03-01 NOTE — Patient Instructions (Addendum)
1. Moderate persistent asthma, uncomplicated - Lung testing looks less stellar.  - But you are more than a number. - Hold off on the inhaled meds for now (until I get an update about the coughing) - Daily controller medication(s): NOTHING (for now) - Prior to physical activity: ProAir 2 puffs 10-15 minutes before physical activity. - Rescue medications: ProAir 4 puffs every 4-6 hours as needed - Asthma control goals:  * Full participation in all desired activities (may need albuterol before activity) * Albuterol use two time or less a week on average (not counting use with activity) * Cough interfering with sleep two time or less a month * Oral steroids no more than once a year * No hospitalizations  2. Chronic rhinitis (dust mite, cockroach, grasses, trees, and ragweed) - We will not make any medication changes at this time.   - Continue taking: Dymista (split up into fluticasone and azelastine) two sprays per nostril 1-2 times daily as needed (generic available now) and Xyzal 5mg  daily  - You can use an extra dose of the antihistamine, if needed, for breakthrough symptoms.  - Consider nasal saline rinses 1-2 times daily to remove allergens from the nasal cavities as well as help with mucous clearance (this is especially helpful to do before the nasal sprays are given).   3. Return in about 6 months (around 08/29/2020).    Please inform us of any Emergency Department visits, hospitalizations, or changes in symptoms. Call us before going to the ED for breathing or allergy symptoms since we might be able to fit you in for a sick visit. Feel free to contact us anytime with any questions, problems, or concerns.  It was a pleasure to see you again today!  Websites that have reliable patient information: 1. American Academy of Asthma, Allergy, and Immunology: www.aaaai.org 2. Food Allergy Research and Education (FARE): foodallergy.org 3. Mothers of Asthmatics:  http://www.asthmacommunitynetwork.org 4. American College of Allergy, Asthma, and Immunology: www.acaai.org   COVID-19 Vaccine Information can be found at: ShippingScam.co.uk For questions related to vaccine distribution or appointments, please email [email or web address] or call [phone].   We realize that you might be concerned about having an allergic reaction to the COVID19 vaccines. To help with that concern, WE ARE OFFERING THE COVID19 VACCINES IN OUR OFFICE! Ask the front desk for dates!     "Like" [place] on Facebook and Instagram for our latest updates!       Make sure you are registered to vote! If you have moved or changed any of your contact information, you will need to get this updated before voting!  In some cases, you MAY be able to register to vote online: [email or web address]

## 2020-03-07 ENCOUNTER — Encounter: Payer: Self-pay | Admitting: Allergy & Immunology

## 2020-03-08 ENCOUNTER — Ambulatory Visit (INDEPENDENT_AMBULATORY_CARE_PROVIDER_SITE_OTHER): Payer: No Typology Code available for payment source | Admitting: Family Medicine

## 2020-03-08 ENCOUNTER — Other Ambulatory Visit: Payer: Self-pay

## 2020-03-08 ENCOUNTER — Encounter: Payer: Self-pay | Admitting: Family Medicine

## 2020-03-08 ENCOUNTER — Other Ambulatory Visit (HOSPITAL_COMMUNITY)
Admission: RE | Admit: 2020-03-08 | Discharge: 2020-03-08 | Disposition: A | Discharge status: Home / Self Care | Payer: No Typology Code available for payment source | Source: Ambulatory Visit | Attending: Family Medicine | Admitting: Family Medicine

## 2020-03-08 VITALS — BP 117/79 | HR 85 | Temp 98.2°F | Resp 20 | Ht 66.0 in | Wt 292.0 lb

## 2020-03-08 DIAGNOSIS — Z0001 Encounter for general adult medical examination with abnormal findings: Secondary | ICD-10-CM | POA: Diagnosis not present

## 2020-03-08 DIAGNOSIS — Z0000 Encounter for general adult medical examination without abnormal findings: Secondary | ICD-10-CM | POA: Insufficient documentation

## 2020-03-08 DIAGNOSIS — I10 Essential (primary) hypertension: Secondary | ICD-10-CM | POA: Diagnosis not present

## 2020-03-08 NOTE — Progress Notes (Signed)
BP 117/79   Pulse 85   Temp 98.2 F (36.8 C) (Temporal)   Resp 20   Ht 5' 6"  (1.676 m)   Wt 292 lb (132.5 kg)   SpO2 96%   BMI 47.13 kg/m    Subjective:   Patient ID: Molly [name], female    DOB: 09/22/1980, 40 y.o.   MRN: [id number]  HPI: Molly [name] is a 40 y.o. female presenting on 03/08/2020 for Annual Exam   HPI Well adult exam and gynecological exam Patient is coming in today for well adult exam and gynecological exam.  She says she is having some tenderness her breasts, it is not all of the time but still the time it comes and goes, she cannot recall exactly if it follows her cycles or not.  She does not drink a lot of caffeine, and at most she has 1 cup of coffee a day.  She says it has been going on for at least a [name] months like this that she has had more tenderness.  She is also been having more vaginal tenderness and irritation and pelvic pain with intercourse and with her cycles recently.  Hypertension Patient is currently on no medication currently, and their blood pressure today is 117/79. Patient denies any lightheadedness or dizziness. Patient denies headaches, blurred vision, chest pains, shortness of breath, or weakness. Denies any side effects from medication and is content with current medication.   Patient came off her antidepressant and feels like things are going well right now we will continue with staying off her antidepressant.  Relevant past medical, surgical, family and social history reviewed and updated as indicated. Interim medical history since our last visit reviewed. Allergies and medications reviewed and updated.  Review of Systems  Constitutional: Negative for chills and fever.  HENT: Negative for ear pain and tinnitus.   Eyes: Negative for pain.  Respiratory: Negative for cough, shortness of breath and wheezing.   Cardiovascular: Negative for chest pain, palpitations and leg swelling.  Gastrointestinal: Positive for abdominal pain.  Negative for blood in stool, constipation and diarrhea.  Genitourinary: Positive for pelvic pain, vaginal discharge and vaginal pain. Negative for dysuria, hematuria, menstrual problem and vaginal bleeding.  Musculoskeletal: Negative for back pain and myalgias.  Skin: Negative for rash.  Neurological: Negative for dizziness, weakness and headaches.  Psychiatric/Behavioral: Negative for suicidal ideas.    Per HPI unless specifically indicated above   Allergies as of 03/08/2020      Reactions   Other Anaphylaxis   Reaction to hay (something in [place] pig's cage), dog and mice   Dust Mite Extract    Lavender Oil Hives   Mold Extract [trichophyton]       Medication List       Accurate as of March 08, 2020  2:24 PM. If you have any questions, ask your nurse or doctor.        amLODipine 5 MG tablet Commonly known as: NORVASC Take 1 tablet (5 mg total) by mouth daily.   azelastine 0.1 % nasal spray Commonly known as: ASTELIN Place 2 sprays into both nostrils daily. Use in each nostril as directed   Breztri Aerosphere 160-9-4.8 MCG/ACT Aero Generic drug: Budeson-Glycopyrrol-Formoterol Inhale 2 puffs into the lungs in the morning and at bedtime.   buPROPion 150 MG 24 hr tablet Commonly known as: WELLBUTRIN XL TAKE 1 TABLET (150 MG TOTAL) BY MOUTH DAILY.   Digestive Enzymes Tabs Take by mouth 2 (two) times daily.   ELDERBERRY PO  Take 1 tablet by mouth as needed.   fluticasone 50 MCG/ACT nasal spray Commonly known as: FLONASE Place 1 spray into both nostrils daily.   levocetirizine 5 MG tablet Commonly known as: XYZAL Take 1 tablet (5 mg total) by mouth every evening.   melatonin 5 MG Tabs Take by mouth as needed.   multivitamin with minerals Tabs tablet Take 1 tablet by mouth daily.   ProAir RespiClick 014 (90 Base) MCG/ACT Aepb Generic drug: Albuterol Sulfate Inhale 2 puffs into the lungs every 6 (six) hours as needed.   PROBIOTIC DAILY PO Take by mouth  daily.   Vitamin C Chew Chew 2-3 tablets by mouth daily.        Objective:   BP 117/79   Pulse 85   Temp 98.2 F (36.8 C) (Temporal)   Resp 20   Ht 5' 6"  (1.676 m)   Wt 292 lb (132.5 kg)   SpO2 96%   BMI 47.13 kg/m   Wt Readings from Last 3 Encounters:  03/08/20 292 lb (132.5 kg)  03/01/20 292 lb (132.5 kg)  02/02/20 294 lb (133.4 kg)    Physical Exam Vitals and nursing note reviewed. Exam conducted with a chaperone present.  Constitutional:      General: She is not in acute distress.    Appearance: She is well-developed and well-nourished. She is not diaphoretic.  Eyes:     Extraocular Movements: EOM normal.     Conjunctiva/sclera: Conjunctivae normal.     Pupils: Pupils are equal, round, and reactive to light.  Neck:     Thyroid: No thyromegaly.  Cardiovascular:     Rate and Rhythm: Normal rate and regular rhythm.     Pulses: Intact distal pulses.     Heart sounds: Normal heart sounds. No murmur heard.   Pulmonary:     Effort: Pulmonary effort is normal. No respiratory distress.     Breath sounds: Normal breath sounds. No wheezing.  Chest:  Breasts: No discharge from either breast. No tenderness and bleeding. Breasts are symmetrical.     Right: No swelling, bleeding, inverted nipple, mass, nipple discharge, skin change or tenderness.     Left: No swelling, bleeding, inverted nipple, mass, nipple discharge, skin change or tenderness.    Abdominal:     General: Bowel sounds are normal. There is no distension.     Palpations: Abdomen is soft.     Tenderness: There is no abdominal tenderness. There is no guarding or rebound.  Genitourinary:    Exam position: Supine.     Labia:        Right: No rash or lesion.        Left: No rash or lesion.      Vagina: Normal.     Cervix: No cervical motion tenderness, discharge or friability.     Uterus: Normal. Not deviated, not enlarged, not fixed and not tender.      Adnexa:        Right: No mass or tenderness.          Left: No mass or tenderness.    Musculoskeletal:        General: No edema. Normal range of motion.     Cervical back: Neck supple.  Lymphadenopathy:     Cervical: No cervical adenopathy.     Upper Body:  No axillary adenopathy present. Skin:    General: Skin is warm and dry.     Findings: No rash.  Neurological:     Mental Status:  She is alert and oriented to person, place, and time.     Coordination: Coordination normal.  Psychiatric:        Mood and Affect: Mood and affect normal.        Behavior: Behavior normal.       Assessment & Plan:   Problem List Items Addressed This Visit      Cardiovascular and Mediastinum   Essential hypertension   Relevant Orders   CMP14+EGFR   Lipid panel    Other Visit Diagnoses    Well adult exam    -  Primary   Relevant Orders   CBC with Differential/Platelet   CMP14+EGFR   Lipid panel   TSH   Cytology - PAP([place])      Will do blood work to make sure that her hormone levels and thyroid levels are okay and everything else looks good.  If everything looks good on the blood work that we may consider doing suppressive therapy on birth control for a [name] months to see if we can normalize her cycles and ureter some of the cycle issues and breast tenderness.  She will also keep a log to see if it happens more during cycles or not during her cycles over the next [name] months.  Differential Possible endometriosis and polycystic ovarian syndrome. Follow up plan: Return in about 1 year (around 03/08/2021), or if symptoms worsen or fail to improve, for Exam Physical.  Counseling provided for all of the vaccine components No orders of the defined types were placed in this encounter.   Caryl Pina, MD [facility] 03/08/2020, 2:24 PM

## 2020-03-09 LAB — CBC WITH DIFFERENTIAL/PLATELET
Basophils Absolute: 0.1 10*3/uL (ref 0.0–0.2)
Basos: 1 %
EOS (ABSOLUTE): 0.4 10*3/uL (ref 0.0–0.4)
Eos: 4 %
Hematocrit: 39.5 % (ref 34.0–46.6)
Hemoglobin: 13.1 g/dL (ref 11.1–15.9)
Immature Grans (Abs): 0.1 10*3/uL (ref 0.0–0.1)
Immature Granulocytes: 1 %
Lymphocytes Absolute: 2.9 10*3/uL (ref 0.7–3.1)
Lymphs: 27 %
MCH: 26.3 pg — ABNORMAL LOW (ref 26.6–33.0)
MCHC: 33.2 g/dL (ref 31.5–35.7)
MCV: 79 fL (ref 79–97)
Monocytes Absolute: 0.8 10*3/uL (ref 0.1–0.9)
Monocytes: 8 %
Neutrophils Absolute: 6.6 10*3/uL (ref 1.4–7.0)
Neutrophils: 59 %
Platelets: 463 10*3/uL — ABNORMAL HIGH (ref 150–450)
RBC: 4.98 x10E6/uL (ref 3.77–5.28)
RDW: 14.2 % (ref 11.7–15.4)
WBC: 10.8 10*3/uL (ref 3.4–10.8)

## 2020-03-09 LAB — CMP14+EGFR
ALT: 19 IU/L (ref 0–32)
AST: 24 IU/L (ref 0–40)
Albumin/Globulin Ratio: 1.4 (ref 1.2–2.2)
Albumin: 4.4 g/dL (ref 3.8–4.8)
Alkaline Phosphatase: 99 IU/L (ref 44–121)
BUN/Creatinine Ratio: 12 (ref 9–23)
BUN: 8 mg/dL (ref 6–20)
Bilirubin Total: 0.3 mg/dL (ref 0.0–1.2)
CO2: 23 mmol/L (ref 20–29)
Calcium: 9.7 mg/dL (ref 8.7–10.2)
Chloride: 102 mmol/L (ref 96–106)
Creatinine, Ser: 0.65 mg/dL (ref 0.57–1.00)
GFR calc Af Amer: 130 mL/min/{1.73_m2} (ref >59)
GFR calc non Af Amer: 113 mL/min/{1.73_m2} (ref >59)
Globulin, Total: 3.1 g/dL (ref 1.5–4.5)
Glucose: 96 mg/dL (ref 65–99)
Potassium: 4.4 mmol/L (ref 3.5–5.2)
Sodium: 141 mmol/L (ref 134–144)
Total Protein: 7.5 g/dL (ref 6.0–8.5)

## 2020-03-09 LAB — LIPID PANEL
Chol/HDL Ratio: 3.2 ratio (ref 0.0–4.4)
Cholesterol, Total: 144 mg/dL (ref 100–199)
HDL: 45 mg/dL (ref >39)
LDL Chol Calc (NIH): 77 mg/dL (ref 0–99)
Triglycerides: 120 mg/dL (ref 0–149)
VLDL Cholesterol Cal: 22 mg/dL (ref 5–40)

## 2020-03-09 LAB — TSH: TSH: 2.35 u[IU]/mL (ref 0.450–4.500)

## 2020-03-13 LAB — CYTOLOGY - PAP: Diagnosis: NEGATIVE

## 2020-03-20 ENCOUNTER — Encounter: Payer: Self-pay | Admitting: Family Medicine

## 2020-03-23 ENCOUNTER — Ambulatory Visit (INDEPENDENT_AMBULATORY_CARE_PROVIDER_SITE_OTHER): Payer: No Typology Code available for payment source | Admitting: Family Medicine

## 2020-03-23 ENCOUNTER — Other Ambulatory Visit: Payer: Self-pay

## 2020-03-23 ENCOUNTER — Other Ambulatory Visit: Payer: Self-pay | Admitting: Family Medicine

## 2020-03-23 ENCOUNTER — Encounter: Payer: Self-pay | Admitting: Family Medicine

## 2020-03-23 VITALS — BP 121/89 | HR 88 | Ht 66.0 in | Wt 291.0 lb

## 2020-03-23 DIAGNOSIS — M5441 Lumbago with sciatica, right side: Secondary | ICD-10-CM | POA: Diagnosis not present

## 2020-03-23 MED ORDER — METHYLPREDNISOLONE ACETATE 80 MG/ML IJ SUSP
80.0000 mg | Freq: Once | INTRAMUSCULAR | Status: AC
  Administered 2020-03-23: 80 mg via INTRAMUSCULAR

## 2020-03-23 MED ORDER — METHYLPREDNISOLONE ACETATE 80 MG/ML IJ SUSP: 80.0000 mg | Freq: Once | INTRAMUSCULAR | Status: DC

## 2020-03-23 MED ORDER — BACLOFEN 10 MG PO TABS: 10.0000 mg | ORAL_TABLET | Freq: Three times a day (TID) | ORAL | 0 refills | Status: DC

## 2020-03-23 MED FILL — BACLOFEN 10 MG TABS: 10 | 20 days supply | Qty: 60 | Fill #0

## 2020-03-23 NOTE — Progress Notes (Signed)
BP 121/89    Pulse 88    Ht 5\' 6"  (1.676 m)    Wt 291 lb (132 kg)    SpO2 100%    BMI 46.97 kg/m    Subjective:   Patient ID: Molly [name], female    DOB: 1980-04-21, 40 y.o.   MRN: [id number]  HPI: Molly [name] is a 40 y.o. female presenting on 03/23/2020 for Back Pain [place] last weekend. Twisted back and rolled left ankle. Ankle is fine. Back hurts with movement. Pt also c/o right sciatica pain. Used Bengay patch and cause red, itchy rash-mid back)   HPI Patient is coming in complaining of mid low back pain.  She fell just over a week ago.  She says she was out chasing her docs that got loose and she rolled her ankle and landed right on her buttocks and since then she has been having mid low back pain.  She says she twisted her foot on her left ankle and twisted her back when she fell.  She says the back hurts more with movement and rotation.  She says it hurts sometimes laying on it as well but more when she is on her feet for long periods of time.  She says her ankle is doing better but the back is still hurting her.  She has been using heating pad and a BenGay patch and some lidocaine patches.  The BenGay patch caused her to have an irritation in that spot on her back but the lidocaine patches are helping.  Relevant past medical, surgical, family and social history reviewed and updated as indicated. Interim medical history since our last visit reviewed. Allergies and medications reviewed and updated.  Review of Systems  Constitutional: Negative for chills and fever.  HENT: Negative for ear pain and tinnitus.   Eyes: Negative for blurred vision and pain.  Respiratory: Negative for cough, shortness of breath and wheezing.   Cardiovascular: Negative for chest pain, palpitations and leg swelling.  Gastrointestinal: Negative for abdominal pain, blood in stool, constipation, diarrhea and melena.  Genitourinary: Negative for dysuria and hematuria.  Musculoskeletal: Positive for back pain and  myalgias. Negative for joint pain.  Skin: Negative for rash.  Neurological: Negative for dizziness, sensory change, focal weakness, weakness and headaches.  Psychiatric/Behavioral: Negative for depression and suicidal ideas.     Per HPI unless specifically indicated above   Allergies as of 03/23/2020      Reactions   Other Anaphylaxis   Reaction to hay (something in [place] pig's cage), dog and mice   Dust Mite Extract    Lavender Oil Hives   Mold Extract [trichophyton]       Medication List       Accurate as of March 23, 2020 11:41 AM. If you have any questions, ask your nurse or doctor.        azelastine 0.1 % nasal spray Commonly known as: ASTELIN Place 2 sprays into both nostrils daily. Use in each nostril as directed   Breztri Aerosphere 160-9-4.8 MCG/ACT Aero Generic drug: Budeson-Glycopyrrol-Formoterol Inhale 2 puffs into the lungs in the morning and at bedtime.   fluticasone 50 MCG/ACT nasal spray Commonly known as: FLONASE Place 1 spray into both nostrils daily.   levocetirizine 5 MG tablet Commonly known as: XYZAL Take 1 tablet (5 mg total) by mouth every evening.   ProAir RespiClick 767 (90 Base) MCG/ACT Aepb Generic drug: Albuterol Sulfate Inhale 2 puffs into the lungs every 6 (six) hours as needed.  Objective:   BP 121/89    Pulse 88    Ht 5\' 6"  (1.676 m)    Wt 291 lb (132 kg)    SpO2 100%    BMI 46.97 kg/m   Wt Readings from Last 3 Encounters:  03/23/20 291 lb (132 kg)  03/08/20 292 lb (132.5 kg)  03/01/20 292 lb (132.5 kg)    Physical Exam Constitutional:      General: She is not in acute distress.    Appearance: She is well-developed and well-nourished. She is not diaphoretic.  Eyes:     Extraocular Movements: EOM normal.     Conjunctiva/sclera: Conjunctivae normal.  Cardiovascular:     Pulses: Intact distal pulses.  Musculoskeletal:        General: Tenderness present. No edema. Normal range of motion.     Lumbar back: Tenderness  and bony tenderness present. No swelling. Normal range of motion. Negative right straight leg raise test and negative left straight leg raise test.       Back:  Skin:    General: Skin is warm and dry.     Findings: No erythema or rash.  Neurological:     Mental Status: She is alert and oriented to person, place, and time.     Coordination: Coordination normal.  Psychiatric:        Mood and Affect: Mood and affect normal.        Behavior: Behavior normal.     Problem List Items Addressed This Visit   None   Visit Diagnoses    Acute midline low back pain with right-sided sciatica    -  Primary   Relevant Medications   methylPREDNISolone acetate (DEPO-MEDROL) injection 80 mg (Completed) (Start on 03/23/2020 12:15 PM)   baclofen (LIORESAL) 10 MG tablet   methylPREDNISolone acetate (DEPO-MEDROL) injection 80 mg (Start on 03/23/2020 12:15 PM)      Will treat conservatively for now, also gave her a list of exercises and stretches to do and give her some steroids and muscle relaxer and see if we can improve it.  Is been going on for about a week, if not improved return in 6 weeks and we can address whether or not we need an MRI or CT scan.   Caryl Pina, MD [facility] 03/23/2020, 12:06 PM

## 2020-04-21 ENCOUNTER — Ambulatory Visit: Payer: No Typology Code available for payment source | Admitting: Family Medicine

## 2020-07-19 ENCOUNTER — Other Ambulatory Visit: Payer: No Typology Code available for payment source

## 2020-07-19 ENCOUNTER — Telehealth: Payer: Self-pay

## 2020-07-19 ENCOUNTER — Other Ambulatory Visit: Payer: Self-pay

## 2020-07-19 DIAGNOSIS — M542 Cervicalgia: Secondary | ICD-10-CM

## 2020-07-19 DIAGNOSIS — M2550 Pain in unspecified joint: Secondary | ICD-10-CM

## 2020-07-19 DIAGNOSIS — R519 Headache, unspecified: Secondary | ICD-10-CM

## 2020-07-19 NOTE — Telephone Encounter (Signed)
Placed order for lymes

## 2020-07-20 LAB — LYME DISEASE SEROLOGY W/REFLEX: Lyme Total Antibody EIA: NEGATIVE

## 2020-08-30 ENCOUNTER — Ambulatory Visit: Payer: No Typology Code available for payment source | Admitting: Allergy & Immunology

## 2020-08-30 ENCOUNTER — Other Ambulatory Visit (HOSPITAL_BASED_OUTPATIENT_CLINIC_OR_DEPARTMENT_OTHER): Payer: Self-pay

## 2020-08-30 ENCOUNTER — Encounter: Payer: Self-pay | Admitting: Allergy & Immunology

## 2020-08-30 ENCOUNTER — Other Ambulatory Visit: Payer: Self-pay

## 2020-08-30 VITALS — BP 126/88 | HR 85 | Resp 17

## 2020-08-30 DIAGNOSIS — J3089 Other allergic rhinitis: Secondary | ICD-10-CM | POA: Diagnosis not present

## 2020-08-30 DIAGNOSIS — J4540 Moderate persistent asthma, uncomplicated: Secondary | ICD-10-CM | POA: Diagnosis not present

## 2020-08-30 DIAGNOSIS — J302 Other seasonal allergic rhinitis: Secondary | ICD-10-CM | POA: Diagnosis not present

## 2020-08-30 MED ORDER — ALBUTEROL SULFATE HFA 108 (90 BASE) MCG/ACT IN AERS
2.0000 | INHALATION_SPRAY | RESPIRATORY_TRACT | 1 refills | Status: DC | PRN
Start: 2020-08-30 — End: 2021-02-28
  Filled 2020-08-30: qty 18, 17d supply, fill #0
  Filled 2020-09-29: qty 18, 17d supply, fill #1

## 2020-08-30 MED ORDER — LEVOCETIRIZINE DIHYDROCHLORIDE 5 MG PO TABS
5.0000 mg | ORAL_TABLET | Freq: Every evening | ORAL | 1 refills | Status: DC
Start: 2020-08-30 — End: 2021-02-28
  Filled 2020-08-30: qty 90, 90d supply, fill #0

## 2020-08-30 NOTE — Patient Instructions (Addendum)
1. Moderate persistent asthma, uncomplicated - Lung testing looks amazing. - Keep up the good work!  - Daily controller medication(s): NOTHING - Prior to physical activity: ProAir 2 puffs 10-15 minutes before physical activity. - Rescue medications: ProAir 4 puffs every 4-6 hours as needed - Asthma control goals:  * Full participation in all desired activities (may need albuterol before activity) * Albuterol use two time or less a week on average (not counting use with activity) * Cough interfering with sleep two time or less a month * Oral steroids no more than once a year * No hospitalizations  2. Chronic rhinitis (dust mite, cockroach, grasses, trees, and ragweed) - We will not make any medication changes at this time.   - Continue taking: Dymista (split up into fluticasone and azelastine) two sprays per nostril 1-2 times daily as needed (generic available now) and Xyzal '5mg'$  daily  - You can use an extra dose of the antihistamine, if needed, for breakthrough symptoms.  - Consider nasal saline rinses 1-2 times daily to remove allergens from the nasal cavities as well as help with mucous clearance (this is especially helpful to do before the nasal sprays are given).   3. Return in about 6 months (around 03/02/2021).    Please inform us of any Emergency Department visits, hospitalizations, or changes in symptoms. Call us before going to the ED for breathing or allergy symptoms since we might be able to fit you in for a sick visit. Feel free to contact us anytime with any questions, problems, or concerns.  It was a pleasure to see you again today!  Websites that have reliable patient information: 1. American Academy of Asthma, Allergy, and Immunology: www.aaaai.org 2. Food Allergy Research and Education (FARE): foodallergy.org 3. Mothers of Asthmatics: http://www.asthmacommunitynetwork.org 4. American College of Allergy, Asthma, and Immunology: www.acaai.org   COVID-19 Vaccine  Information can be found at: [email or web address].[email or web address].uk For questions related to vaccine distribution or appointments, please email [email or web address] or call [phone].   We realize that you might be concerned about having an allergic reaction to the COVID19 vaccines. To help with that concern, WE ARE OFFERING THE COVID19 VACCINES IN OUR OFFICE! Ask the front desk for dates!     "Like" [place] on Facebook and Instagram for our latest updates!       Make sure you are registered to vote! If you have moved or changed any of your contact information, you will need to get this updated before voting!  In some cases, you MAY be able to register to vote online: [email or web address]    Sunscreen ideas: [employer] or [employer]

## 2020-08-30 NOTE — Progress Notes (Signed)
FOLLOW UP  Date of Service/Encounter:  08/30/20   Assessment:   Moderate persistent asthma, uncomplicated - with worsening spirometric findings   Perennial and seasonal allergic rhinitis (dust mite, cockroach, grasses, trees, and ragweed)   Vaccine hesitancy - but no allergies to PEG or polysorbate   Concern for adverse reaction to amlodipine (time course not entirely clear) - introducing this back again to see if it worsens her cough  Plan/Recommendations:   1. Moderate persistent asthma, uncomplicated - Lung testing looks amazing. - Keep up the good work!  - Daily controller medication(s): NOTHING - Prior to physical activity: ProAir 2 puffs 10-15 minutes before physical activity. - Rescue medications: ProAir 4 puffs every 4-6 hours as needed - Asthma control goals:  * Full participation in all desired activities (may need albuterol before activity) * Albuterol use two time or less a week on average (not counting use with activity) * Cough interfering with sleep two time or less a month * Oral steroids no more than once a year * No hospitalizations  2. Chronic rhinitis (dust mite, cockroach, grasses, trees, and ragweed) - We will not make any medication changes at this time.   - Continue taking: Dymista (split up into fluticasone and azelastine) two sprays per nostril 1-2 times daily as needed (generic available now) and Xyzal '5mg'$  daily  - You can use an extra dose of the antihistamine, if needed, for breakthrough symptoms.  - Consider nasal saline rinses 1-2 times daily to remove allergens from the nasal cavities as well as help with mucous clearance (this is especially helpful to do before the nasal sprays are given).   3. Return in about 6 months (around 03/02/2021).    Subjective:   Molly [name] is a 40 y.o. female presenting today for follow up of  Chief Complaint  Patient presents with   Asthma    Molly [name] has a history of the following: Patient Active  Problem List   Diagnosis Date Noted   History of colitis 02/25/2019   GERD (gastroesophageal reflux disease) 11/16/2018   Severe obstructive sleep apnea-hypopnea syndrome 09/22/2018   Chronic intermittent hypoxia with obstructive sleep apnea 09/22/2018   Essential hypertension 03/02/2018   Morbid obesity ([place]) 02/02/2018   Moderate persistent asthma, uncomplicated [id number]   Non-seasonal allergic rhinitis due to pollen 12/31/2017   Generalized anxiety disorder 06/17/2017    History obtained from: chart review and patient.  Molly [name] is a 40 y.o. female presenting for a follow up visit.  She was last seen in February 2022.  At that time, her lung testing looks less than stellar, but she was doing well symptomatically.  We held off on the inhaled meds for now and continue with albuterol as needed.  For her rhinitis, we continue with Dymista and Xyzal.  She had also recently reacted to amlodipine which she felt might be related to the cough.  I told her to restart the amlodipine to see if the cough returned and within a week it did.  Since the last visit, she has done very well.   Asthma/Respiratory Symptom History: She has  done well with regards to her breathing. She has had some problems with hiking and overdoing it. She does not premedicate with albuterol prior to physical activity. She does do some messing with the goats and the chickens. She has not needed prednisone. She has not taken the [place] since having COVID last November. She has used her albuterol inhaler only once.   Allergic Rhinitis  Symptom History: She has been using the Flonase daily. She uses the azelastine in the spring time when things are particular super pollen.   She is working during the days on Saturday and Sunday. 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   Otherwise, there have been no changes to her past medical history, surgical history, family history, or  social history.    Review of Systems  Constitutional: Negative.  Negative for chills, fever, malaise/fatigue and weight loss.  HENT:  Negative for congestion, ear discharge, ear pain and sinus pain.   Eyes:  Negative for pain, discharge and redness.  Respiratory:  Positive for cough. Negative for sputum production, shortness of breath and wheezing.   Cardiovascular: Negative.  Negative for chest pain and palpitations.  Gastrointestinal:  Negative for abdominal pain, constipation, diarrhea, heartburn, nausea and vomiting.  Skin: Negative.  Negative for itching and rash.  Neurological:  Negative for dizziness and headaches.  Endo/Heme/Allergies:  Positive for environmental allergies. Does not bruise/bleed easily.      Objective:   Blood pressure 126/88, pulse 85, resp. rate 17, SpO2 98 %. There is no height or weight on file to calculate BMI.   Physical Exam:  Physical Exam Constitutional:      Appearance: She is well-developed and normal weight.     Comments: Talkative.   HENT:     Head: Normocephalic and atraumatic.     Right Ear: Tympanic membrane, ear canal and external ear normal.     Left Ear: Tympanic membrane, ear canal and external ear normal.     Nose: No nasal deformity, septal deviation, mucosal edema or rhinorrhea.     Right Turbinates: Enlarged, swollen and pale.     Left Turbinates: Enlarged, swollen and pale.     Right Sinus: No maxillary sinus tenderness or frontal sinus tenderness.     Left Sinus: No maxillary sinus tenderness or frontal sinus tenderness.     Mouth/Throat:     Mouth: Mucous membranes are not pale and not dry.     Pharynx: Uvula midline.  Eyes:     General: Lids are normal. No allergic shiner.       Right eye: No discharge.        Left eye: No discharge.     Conjunctiva/sclera: Conjunctivae normal.     Right eye: Right conjunctiva is not injected. No chemosis.    Left eye: Left conjunctiva is not injected. No chemosis.    Pupils: Pupils  are equal, round, and reactive to light.  Cardiovascular:     Rate and Rhythm: Normal rate and regular rhythm.     Heart sounds: Normal heart sounds.  Pulmonary:     Effort: Pulmonary effort is normal. No tachypnea, accessory muscle usage or respiratory distress.     Breath sounds: Normal breath sounds. No wheezing, rhonchi or rales.     Comments: Moving air well in all lung fields. No increased work of breathing noted.  Chest:     Chest wall: No tenderness.  Lymphadenopathy:     Cervical: No cervical adenopathy.  Skin:    General: Skin is warm.     Capillary Refill: Capillary refill takes less than 2 seconds.     Coloration: Skin is not pale.     Findings: No abrasion, erythema, petechiae or rash. Rash is not papular, urticarial or vesicular.  Neurological:     Mental Status: She is alert.  Psychiatric:  Behavior: Behavior is cooperative.     Diagnostic studies:    Spirometry: results normal (FEV1: 2.74/84%, FVC: 3.19/99 at 80%, FEV1/FVC: 86%).      Allergy Studies: none         Salvatore Marvel, MD  [facility]

## 2020-08-31 ENCOUNTER — Encounter: Payer: Self-pay | Admitting: Allergy & Immunology

## 2020-09-01 ENCOUNTER — Other Ambulatory Visit (HOSPITAL_BASED_OUTPATIENT_CLINIC_OR_DEPARTMENT_OTHER): Payer: Self-pay

## 2020-09-13 ENCOUNTER — Other Ambulatory Visit (HOSPITAL_COMMUNITY): Payer: Self-pay

## 2020-09-13 ENCOUNTER — Encounter: Payer: Self-pay | Admitting: Family Medicine

## 2020-09-13 ENCOUNTER — Ambulatory Visit (INDEPENDENT_AMBULATORY_CARE_PROVIDER_SITE_OTHER): Payer: No Typology Code available for payment source | Admitting: Family Medicine

## 2020-09-13 ENCOUNTER — Other Ambulatory Visit: Payer: Self-pay

## 2020-09-13 VITALS — BP 129/87 | HR 88 | Ht 66.0 in | Wt 298.0 lb

## 2020-09-13 DIAGNOSIS — F32A Depression, unspecified: Secondary | ICD-10-CM | POA: Diagnosis not present

## 2020-09-13 DIAGNOSIS — F419 Anxiety disorder, unspecified: Secondary | ICD-10-CM

## 2020-09-13 DIAGNOSIS — E8881 Metabolic syndrome: Secondary | ICD-10-CM | POA: Diagnosis not present

## 2020-09-13 MED ORDER — TIRZEPATIDE 2.5 MG/0.5ML ~~LOC~~ SOAJ
2.5000 mg | SUBCUTANEOUS | 1 refills | Status: DC
  Filled 2020-09-13 – 2020-09-28 (×2): qty 2, 28d supply, fill #0

## 2020-09-13 MED ORDER — BUPROPION HCL ER (XL) 150 MG PO TB24
150.0000 mg | ORAL_TABLET | Freq: Every day | ORAL | 2 refills | Status: DC
  Filled 2020-09-13 – 2020-09-28 (×2): qty 30, 30d supply, fill #0

## 2020-09-13 NOTE — Progress Notes (Signed)
BP 129/87   Pulse 88   Ht '5\' 6"'$  (1.676 m)   Wt 298 lb (135.2 kg)   SpO2 97%   BMI 48.10 kg/m    Subjective:   Patient ID: [name], female    DOB: April 14, 1980, 40 y.o.   MRN: [id number]  HPI: Maevis Dumitrescu is a 40 y.o. female presenting on 09/13/2020 for Anxiety   HPI Anxiety and depression and mood Patient is coming in today for anxiety and depression.  She feels like she is frequently overwhelmed and has decreased motivation and she is irritable.  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  She also feels that her new job having her best friend is her supervisor has caused some stress in the relationship she does not have that person who she can relate to intact to is frequently which is affected her as well.  She also feels like in her home relationship her body image decreased intimacy with her husband has affected her mood as well.  Depression screen [facility] 2/9 09/13/2020 03/23/2020 03/08/2020 07/29/2019 07/15/2019  Decreased Interest 0 0 0 0 0  Down, Depressed, Hopeless 1 0 0 0 0  PHQ - 2 Score 1 0 0 0 0     Relevant past medical, surgical, family and social history reviewed and updated as indicated. Interim medical history since our last visit reviewed. Allergies and medications reviewed and updated.  Review of Systems  Constitutional:  Negative for chills and fever.  Eyes:  Negative for visual disturbance.  Respiratory:  Negative for chest tightness and shortness of breath.   Cardiovascular:  Negative for chest pain and leg swelling.  Skin:  Negative for rash.  Neurological:  Negative for dizziness, light-headedness and headaches.  Psychiatric/Behavioral:  Positive for dysphoric mood. Negative for agitation, behavioral problems, self-injury, sleep disturbance and suicidal ideas. The patient is  nervous/anxious.   All other systems reviewed and are negative.  Per HPI unless specifically indicated above   Allergies as of 09/13/2020       Reactions   Other Anaphylaxis   Reaction to hay (something in [place] pig's cage), dog and mice   Dust Mite Extract    Lavender Oil Hives   Mold Extract [trichophyton]         Medication List        Accurate as of September 13, 2020  2:53 PM. If you have any questions, ask your nurse or doctor.          albuterol 108 (90 Base) MCG/ACT inhaler Commonly known as: VENTOLIN HFA Inhale 2 puffs into the lungs every 4 (four) hours as needed.   azelastine 0.1 % nasal spray Commonly known as: ASTELIN Place 2 sprays into both nostrils daily. Use in each nostril as directed   baclofen 10 MG tablet Commonly known as: LIORESAL TAKE 1 TABLET (10 MG TOTAL) BY MOUTH 3 (THREE) TIMES DAILY.   Breztri Aerosphere 160-9-4.8 MCG/ACT Aero Generic drug: Budeson-Glycopyrrol-Formoterol Inhale 2 puffs into the lungs in the morning and at bedtime.   buPROPion 150 MG 24 hr tablet Commonly known as: Wellbutrin XL Take 1 tablet (150 mg total) by mouth daily. Started by: Fransisca Kaufmann Waino Mounsey, MD   fluticasone 50 MCG/ACT nasal spray Commonly known as: FLONASE Place 1 spray into both nostrils  daily.   levocetirizine 5 MG tablet Commonly known as: XYZAL Take 1 tablet (5 mg total) by mouth every evening.   tirzepatide 2.5 MG/0.5ML Pen Commonly known as: MOUNJARO Inject 2.5 mg into the skin once a week. Started by: Fransisca Kaufmann Caliyah Sieh, MD         Objective:   BP 129/87   Pulse 88   Ht '5\' 6"'$  (1.676 m)   Wt 298 lb (135.2 kg)   SpO2 97%   BMI 48.10 kg/m   Wt Readings from Last 3 Encounters:  09/13/20 298 lb (135.2 kg)  03/23/20 291 lb (132 kg)  03/08/20 292 lb (132.5 kg)    Physical Exam Vitals and nursing note reviewed.  Constitutional:      General: She is not in acute distress.    Appearance: She is well-developed. She is not  diaphoretic.  Eyes:     Conjunctiva/sclera: Conjunctivae normal.     Pupils: Pupils are equal, round, and reactive to light.  Cardiovascular:     Rate and Rhythm: Normal rate and regular rhythm.     Heart sounds: Normal heart sounds. No murmur heard. Pulmonary:     Effort: Pulmonary effort is normal. No respiratory distress.     Breath sounds: Normal breath sounds. No wheezing.  Musculoskeletal:        General: No tenderness. Normal range of motion.  Skin:    General: Skin is warm and dry.     Findings: No rash.  Neurological:     Mental Status: She is alert and oriented to person, place, and time.     Coordination: Coordination normal.  Psychiatric:        Attention and Perception: Attention normal.        Mood and Affect: Mood is anxious and depressed.        Behavior: Behavior normal.        Thought Content: Thought content does not include suicidal ideation. Thought content does not include suicidal plan.      Assessment & Plan:   Problem List Items Addressed This Visit       Other   Morbid obesity ([place])   Relevant Medications   tirzepatide (MOUNJARO) 2.5 MG/0.5ML Pen   Other Relevant Orders   CBC with Differential/Platelet   Thyroid Panel With TSH   Other Visit Diagnoses     Anxiety and depression    -  Primary   Relevant Medications   buPROPion (WELLBUTRIN XL) 150 MG 24 hr tablet   Other Relevant Orders   CBC with Differential/Platelet   Thyroid Panel With TSH   Metabolic syndrome       Relevant Medications   tirzepatide (MOUNJARO) 2.5 MG/0.5ML Pen   Other Relevant Orders   CBC with Differential/Platelet   Thyroid Panel With TSH       Patient has metabolic syndrome and morbid obesity including abdominal waist circumference and elevated blood pressure at times.  We will try medication to help with obesity.  Will start Wellbutrin for anxiety and depression Follow up plan: Return if symptoms worsen or fail to improve, for 4 to 5-week depression  recheck.  Counseling provided for all of the vaccine components Orders Placed This Encounter  Procedures   CBC with Differential/Platelet   Thyroid Panel With TSH     Caryl Pina, MD [facility] 09/13/2020, 2:53 PM

## 2020-09-14 LAB — CBC WITH DIFFERENTIAL/PLATELET
Basophils Absolute: 0.1 10*3/uL (ref 0.0–0.2)
Basos: 1 %
EOS (ABSOLUTE): 0.5 10*3/uL — ABNORMAL HIGH (ref 0.0–0.4)
Eos: 4 %
Hematocrit: 38.5 % (ref 34.0–46.6)
Hemoglobin: 12.8 g/dL (ref 11.1–15.9)
Immature Grans (Abs): 0 10*3/uL (ref 0.0–0.1)
Immature Granulocytes: 0 %
Lymphocytes Absolute: 2.8 10*3/uL (ref 0.7–3.1)
Lymphs: 23 %
MCH: 26.4 pg — ABNORMAL LOW (ref 26.6–33.0)
MCHC: 33.2 g/dL (ref 31.5–35.7)
MCV: 80 fL (ref 79–97)
Monocytes Absolute: 0.7 10*3/uL (ref 0.1–0.9)
Monocytes: 6 %
Neutrophils Absolute: 8 10*3/uL — ABNORMAL HIGH (ref 1.4–7.0)
Neutrophils: 66 %
Platelets: 433 10*3/uL (ref 150–450)
RBC: 4.84 x10E6/uL (ref 3.77–5.28)
RDW: 13.6 % (ref 11.7–15.4)
WBC: 12.1 10*3/uL — ABNORMAL HIGH (ref 3.4–10.8)

## 2020-09-14 LAB — THYROID PANEL WITH TSH
Free Thyroxine Index: 1.3 (ref 1.2–4.9)
T3 Uptake Ratio: 19 % — ABNORMAL LOW (ref 24–39)
T4, Total: 6.8 ug/dL (ref 4.5–12.0)
TSH: 2.87 u[IU]/mL (ref 0.450–4.500)

## 2020-09-15 ENCOUNTER — Other Ambulatory Visit (HOSPITAL_COMMUNITY): Payer: Self-pay

## 2020-09-20 ENCOUNTER — Ambulatory Visit: Payer: No Typology Code available for payment source | Admitting: Family Medicine

## 2020-09-21 ENCOUNTER — Other Ambulatory Visit (HOSPITAL_COMMUNITY): Payer: Self-pay

## 2020-09-28 ENCOUNTER — Other Ambulatory Visit (HOSPITAL_BASED_OUTPATIENT_CLINIC_OR_DEPARTMENT_OTHER): Payer: Self-pay

## 2020-09-29 ENCOUNTER — Other Ambulatory Visit (HOSPITAL_BASED_OUTPATIENT_CLINIC_OR_DEPARTMENT_OTHER): Payer: Self-pay

## 2020-10-18 ENCOUNTER — Ambulatory Visit (INDEPENDENT_AMBULATORY_CARE_PROVIDER_SITE_OTHER): Payer: No Typology Code available for payment source | Admitting: Family Medicine

## 2020-10-18 ENCOUNTER — Other Ambulatory Visit: Payer: Self-pay

## 2020-10-18 ENCOUNTER — Other Ambulatory Visit (HOSPITAL_BASED_OUTPATIENT_CLINIC_OR_DEPARTMENT_OTHER): Payer: Self-pay

## 2020-10-18 ENCOUNTER — Encounter: Payer: Self-pay | Admitting: Family Medicine

## 2020-10-18 VITALS — BP 130/90 | HR 82 | Ht 66.0 in | Wt 299.0 lb

## 2020-10-18 DIAGNOSIS — F419 Anxiety disorder, unspecified: Secondary | ICD-10-CM

## 2020-10-18 DIAGNOSIS — F411 Generalized anxiety disorder: Secondary | ICD-10-CM

## 2020-10-18 DIAGNOSIS — F32A Depression, unspecified: Secondary | ICD-10-CM | POA: Diagnosis not present

## 2020-10-18 DIAGNOSIS — Z23 Encounter for immunization: Secondary | ICD-10-CM

## 2020-10-18 MED ORDER — BUPROPION HCL ER (XL) 300 MG PO TB24
300.0000 mg | ORAL_TABLET | Freq: Every day | ORAL | 2 refills | Status: DC
  Filled 2020-10-18 – 2020-12-01 (×3): qty 30, 30d supply, fill #0

## 2020-10-18 NOTE — Progress Notes (Signed)
BP 130/90   Pulse 82   Ht 5\' 6"  (1.676 m)   Wt 299 lb (135.6 kg)   SpO2 98%   BMI 48.26 kg/m    Subjective:   Patient ID: Molly [name], female    DOB: 26-Feb-1980, 40 y.o.   MRN: [id number]  HPI: Molly [name] is a 40 y.o. female presenting on 10/18/2020 for Medical Management of Chronic Issues, Anxiety, and Depression   HPI Anxiety depression recheck Patient is coming in today for anxiety depression recheck.  Last visit we had we did start Wellbutrin for her.  She is feels like she is doing better but still having some difficulty shutting her mind down at night and still having some irritability at home with her child who is homeschooling but work is going better and she is not feeling like she gets anxious to the point of panic attacks as often.  She denies any suicidal ideations or thoughts of hurting herself.  She denies any side effects from the medicine does feel like it is helping. Depression screen [facility] 2/9 09/13/2020 03/23/2020 03/08/2020 07/29/2019 07/15/2019  Decreased Interest 0 0 0 0 0  Down, Depressed, Hopeless 1 0 0 0 0  PHQ - 2 Score 1 0 0 0 0  She did not Start the weight loss medicine yet but will once her stomach settles down   Relevant past medical, surgical, family and social history reviewed and updated as indicated. Interim medical history since our last visit reviewed. Allergies and medications reviewed and updated.  Review of Systems  Constitutional:  Negative for chills and fever.  Eyes:  Negative for visual disturbance.  Respiratory:  Negative for chest tightness and shortness of breath.   Cardiovascular:  Negative for chest pain and leg swelling.  Musculoskeletal:  Negative for back pain and gait problem.  Skin:  Negative for rash.  Neurological:  Negative for light-headedness and headaches.  Psychiatric/Behavioral:  Positive for dysphoric mood and sleep disturbance. Negative for agitation, behavioral problems, self-injury and suicidal ideas. The patient is  nervous/anxious.   All other systems reviewed and are negative.  Per HPI unless specifically indicated above   Allergies as of 10/18/2020       Reactions   Other Anaphylaxis   Reaction to hay (something in [place] pig's cage), dog and mice   Dust Mite Extract    Lavender Oil Hives   Mold Extract [trichophyton]         Medication List        Accurate as of October 18, 2020  2:36 PM. If you have any questions, ask your nurse or doctor.          albuterol 108 (90 Base) MCG/ACT inhaler Commonly known as: VENTOLIN HFA Inhale 2 puffs into the lungs every 4 (four) hours as needed.   azelastine 0.1 % nasal spray Commonly known as: ASTELIN Place 2 sprays into both nostrils daily. Use in each nostril as directed   baclofen 10 MG tablet Commonly known as: LIORESAL TAKE 1 TABLET (10 MG TOTAL) BY MOUTH 3 (THREE) TIMES DAILY.   Breztri Aerosphere 160-9-4.8 MCG/ACT Aero Generic drug: Budeson-Glycopyrrol-Formoterol Inhale 2 puffs into the lungs in the morning and at bedtime.   buPROPion 300 MG 24 hr tablet Commonly known as: Wellbutrin XL Take 1 tablet (300 mg total) by mouth daily. What changed:  medication strength how much to take Changed by: Fransisca Kaufmann Kielyn Kardell, MD   fluticasone 50 MCG/ACT nasal spray Commonly known as: FLONASE Place 1 spray  into both nostrils daily.   levocetirizine 5 MG tablet Commonly known as: XYZAL Take 1 tablet (5 mg total) by mouth every evening.   Mounjaro 2.5 MG/0.5ML Pen Generic drug: tirzepatide Inject 2.5 mg into the skin once a week.         Objective:   BP 130/90   Pulse 82   Ht 5\' 6"  (1.676 m)   Wt 299 lb (135.6 kg)   SpO2 98%   BMI 48.26 kg/m   Wt Readings from Last 3 Encounters:  10/18/20 299 lb (135.6 kg)  09/13/20 298 lb (135.2 kg)  03/23/20 291 lb (132 kg)    Physical Exam Vitals and nursing note reviewed.  Constitutional:      General: She is not in acute distress.    Appearance: She is well-developed. She  is not diaphoretic.  Eyes:     Conjunctiva/sclera: Conjunctivae normal.  Cardiovascular:     Rate and Rhythm: Normal rate and regular rhythm.     Heart sounds: Normal heart sounds. No murmur heard. Pulmonary:     Effort: Pulmonary effort is normal. No respiratory distress.     Breath sounds: Normal breath sounds. No wheezing.  Skin:    General: Skin is warm and dry.     Findings: No rash.  Neurological:     Mental Status: She is alert and oriented to person, place, and time.     Coordination: Coordination normal.  Psychiatric:        Behavior: Behavior normal.      Assessment & Plan:   Problem List Items Addressed This Visit       Other   Generalized anxiety disorder - Primary   Relevant Medications   buPROPion (WELLBUTRIN XL) 300 MG 24 hr tablet   Other Visit Diagnoses     Need for immunization against influenza       Relevant Orders   Flu Vaccine QUAD 67mo+IM (Fluarix, Fluzone & Alfiuria Quad PF) (Completed)   Anxiety and depression       Relevant Medications   buPROPion (WELLBUTRIN XL) 300 MG 24 hr tablet       Increase Wellbutrin to 300 mg daily and follow-up in 5 to 6 weeks to see how she is doing. Follow up plan: Return if symptoms worsen or fail to improve, for 5 to 6-week anxiety follow-up.  Counseling provided for all of the vaccine components Orders Placed This Encounter  Procedures   Flu Vaccine QUAD 43mo+IM (Fluarix, Fluzone & Alfiuria Quad PF)    Caryl Pina, MD [facility] 10/18/2020, 2:36 PM

## 2020-10-20 ENCOUNTER — Other Ambulatory Visit (HOSPITAL_BASED_OUTPATIENT_CLINIC_OR_DEPARTMENT_OTHER): Payer: Self-pay

## 2020-11-15 ENCOUNTER — Ambulatory Visit (INDEPENDENT_AMBULATORY_CARE_PROVIDER_SITE_OTHER): Payer: No Typology Code available for payment source | Admitting: Family Medicine

## 2020-11-15 ENCOUNTER — Encounter: Payer: Self-pay | Admitting: Family Medicine

## 2020-11-15 ENCOUNTER — Other Ambulatory Visit: Payer: Self-pay

## 2020-11-15 VITALS — BP 118/80 | HR 83 | Ht 66.0 in | Wt 294.0 lb

## 2020-11-15 DIAGNOSIS — F411 Generalized anxiety disorder: Secondary | ICD-10-CM | POA: Diagnosis not present

## 2020-11-15 NOTE — Progress Notes (Signed)
BP 118/80   Pulse 83   Ht 5\' 6"  (1.676 m)   Wt 294 lb (133.4 kg)   SpO2 94%   BMI 47.45 kg/m    Subjective:   Patient ID: Molly [name], female    DOB: 1980/10/27, 40 y.o.   MRN: [id number]  HPI: Molly [name] is a 40 y.o. female presenting on 11/15/2020 for Medical Management of Chronic Issues, Anxiety, and Depression   HPI Anxiety recheck Patient is coming in today for anxiety recheck.  She currently taking Wellbutrin and increase it to 300 mg just over a month ago and she feels like it is doing very well and she feels like she is in a good place and she is not as irritable and more calm and denies any major side effects from the medication.  She is getting little bit of flushing and hot flashes occasionally or feeling warm but she says that is tolerable.  Relevant past medical, surgical, family and social history reviewed and updated as indicated. Interim medical history since our last visit reviewed. Allergies and medications reviewed and updated.  Review of Systems  Constitutional:  Negative for chills and fever.  Eyes:  Negative for visual disturbance.  Respiratory:  Negative for chest tightness and shortness of breath.   Cardiovascular:  Negative for chest pain and leg swelling.  Musculoskeletal:  Negative for back pain and gait problem.  Skin:  Negative for rash.  Neurological:  Negative for light-headedness and headaches.  Psychiatric/Behavioral:  Negative for agitation, behavioral problems, dysphoric mood, self-injury, sleep disturbance and suicidal ideas. The patient is not nervous/anxious.   All other systems reviewed and are negative.  Per HPI unless specifically indicated above   Allergies as of 11/15/2020       Reactions   Other Anaphylaxis   Reaction to hay (something in [place] pig's cage), dog and mice   Dust Mite Extract    Lavender Oil Hives   Mold Extract [trichophyton]         Medication List        Accurate as of November 15, 2020 11:37 AM.  If you have any questions, ask your nurse or doctor.          albuterol 108 (90 Base) MCG/ACT inhaler Commonly known as: VENTOLIN HFA Inhale 2 puffs into the lungs every 4 (four) hours as needed.   azelastine 0.1 % nasal spray Commonly known as: ASTELIN Place 2 sprays into both nostrils daily. Use in each nostril as directed   baclofen 10 MG tablet Commonly known as: LIORESAL TAKE 1 TABLET (10 MG TOTAL) BY MOUTH 3 (THREE) TIMES DAILY.   Breztri Aerosphere 160-9-4.8 MCG/ACT Aero Generic drug: Budeson-Glycopyrrol-Formoterol Inhale 2 puffs into the lungs in the morning and at bedtime.   buPROPion 300 MG 24 hr tablet Commonly known as: Wellbutrin XL Take 1 tablet (300 mg total) by mouth daily.   fluticasone 50 MCG/ACT nasal spray Commonly known as: FLONASE Place 1 spray into both nostrils daily.   levocetirizine 5 MG tablet Commonly known as: XYZAL Take 1 tablet (5 mg total) by mouth every evening.   Mounjaro 2.5 MG/0.5ML Pen Generic drug: tirzepatide Inject 2.5 mg into the skin once a week.         Objective:   BP 118/80   Pulse 83   Ht 5\' 6"  (1.676 m)   Wt 294 lb (133.4 kg)   SpO2 94%   BMI 47.45 kg/m   Wt Readings from Last 3 Encounters:  11/15/20  294 lb (133.4 kg)  10/18/20 299 lb (135.6 kg)  09/13/20 298 lb (135.2 kg)    Physical Exam Vitals and nursing note reviewed.  Constitutional:      General: She is not in acute distress.    Appearance: She is well-developed. She is not diaphoretic.  Eyes:     Conjunctiva/sclera: Conjunctivae normal.  Cardiovascular:     Rate and Rhythm: Normal rate and regular rhythm.     Heart sounds: Normal heart sounds. No murmur heard. Pulmonary:     Effort: Pulmonary effort is normal. No respiratory distress.     Breath sounds: Normal breath sounds. No wheezing.  Musculoskeletal:        General: No swelling or tenderness. Normal range of motion.  Skin:    General: Skin is warm and dry.     Findings: No rash.   Neurological:     Mental Status: She is alert and oriented to person, place, and time.     Coordination: Coordination normal.  Psychiatric:        Behavior: Behavior normal.      Assessment & Plan:   Problem List Items Addressed This Visit       Other   Generalized anxiety disorder - Primary   Morbid obesity ([place])  Had not started him on general, is try to do it on her own but is doing well on the Wellbutrin and losing weight on her own.  She still has some bowel issues like irritable bowel and we discussed some possible medications to try including Linzess.  She is already had colonoscopy in the past. Follow up plan: Return in about 3 months (around 02/15/2021), or if symptoms worsen or fail to improve, for Anxiety recheck.  Counseling provided for all of the vaccine components No orders of the defined types were placed in this encounter.   Caryl Pina, MD [facility] 11/15/2020, 11:37 AM

## 2020-12-01 ENCOUNTER — Other Ambulatory Visit (HOSPITAL_BASED_OUTPATIENT_CLINIC_OR_DEPARTMENT_OTHER): Payer: Self-pay

## 2020-12-08 IMAGING — RF DG UGI W SINGLE CM
10 series · 14 of 14 positions shown · non-contrast
Comparison: CT scan of the abdomen dated 01/02/2019

CLINICAL DATA: Morbid obesity. Pre operative exam.

EXAM:
UPPER GI SERIES WITH KUB
TECHNIQUE: After obtaining a scout radiograph a routine upper GI series was
performed using thin and high density barium.
FLUOROSCOPY TIME:  Fluoroscopy Time:  54 seconds
Radiation Exposure Index (if provided by the fluoroscopic device):
52.4 mGy

[Series 1: t abdomen supine · 0.15mm/px · 1 of 1 slices shown]
[im 1/1]
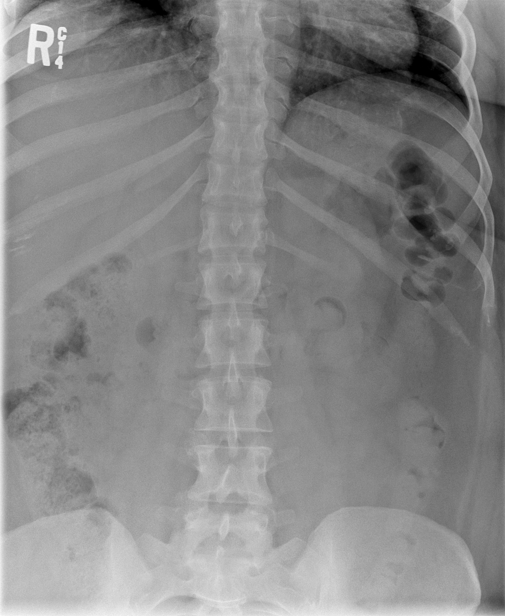

[Series 2: fluoro_barium 2fps_bw · 0.17mm/px · 1 of 1 slices shown (1 of 8)]
[im 1/1]
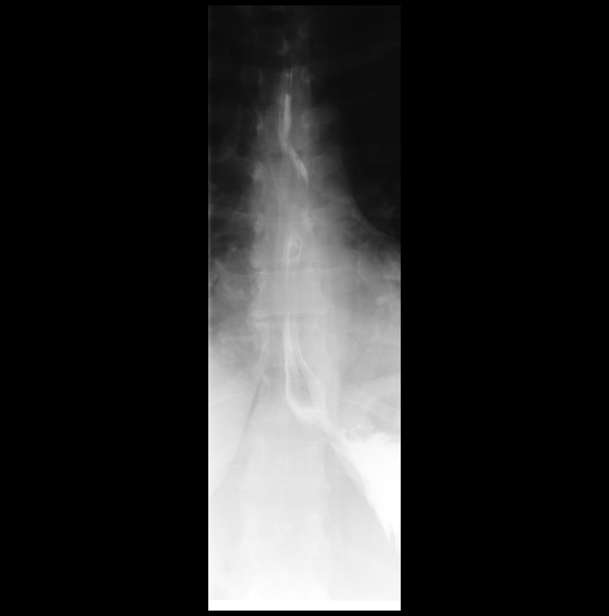

[Series 3: cp_standard · 0.27mm/px · 1 of 1 slices shown]
[im 1/1]
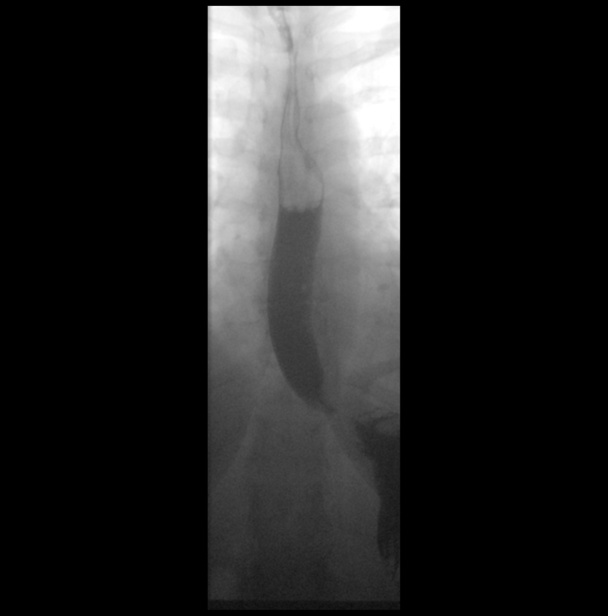

[Series 4: fluoro_barium 2fps_bw · 0.18mm/px · 2 of 2 frames shown (2 of 8)]
[frame 1/2]
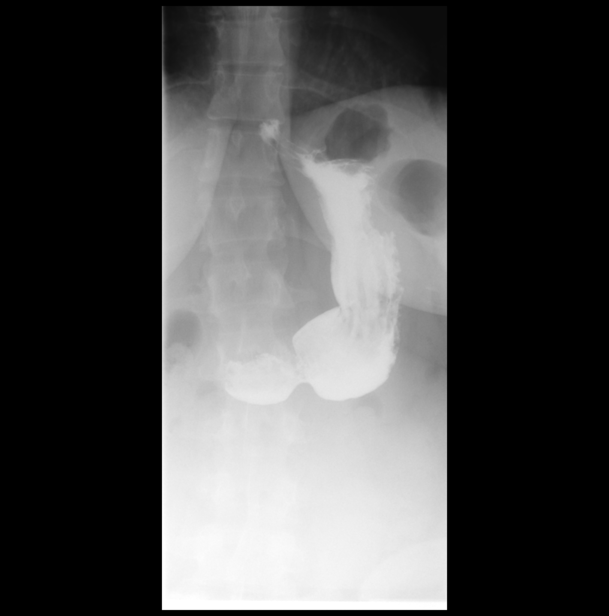
[frame 2/2]
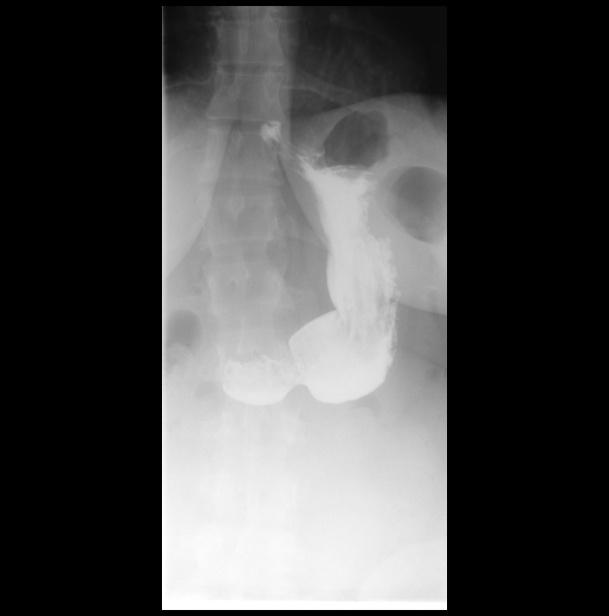

[Series 5: fluoro_barium 2fps_bw · 0.18mm/px · 2 of 2 frames shown (3 of 8)]
[frame 1/2]
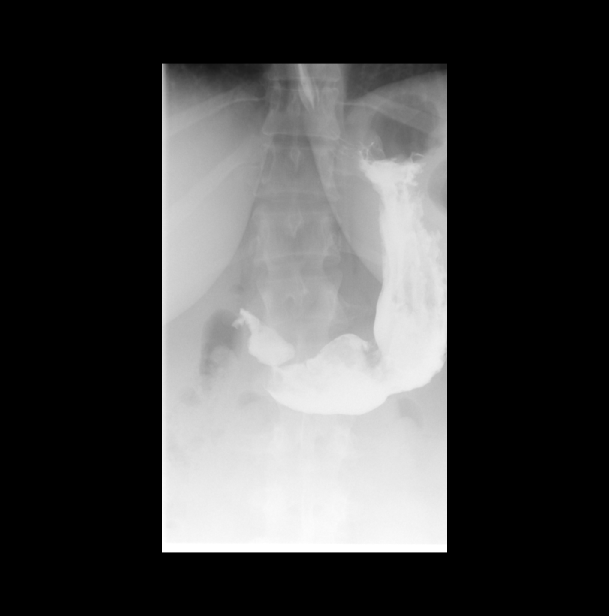
[frame 2/2]
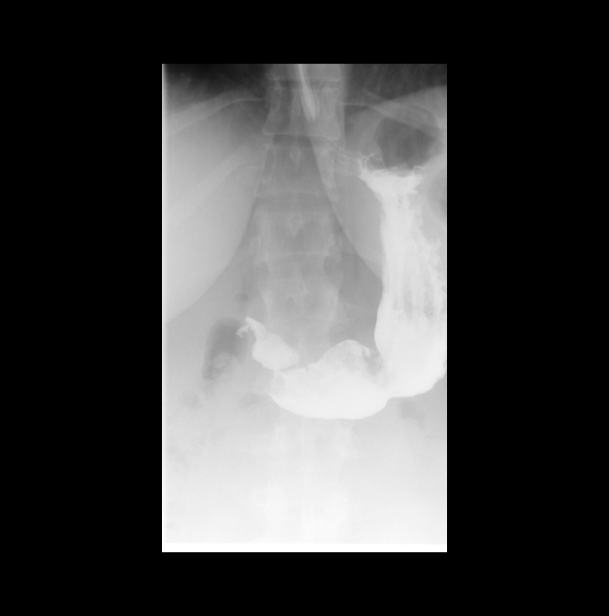

[Series 6: fluoro_barium 2fps_bw · 0.18mm/px · 1 of 1 slices shown (4 of 8)]
[im 1/1]
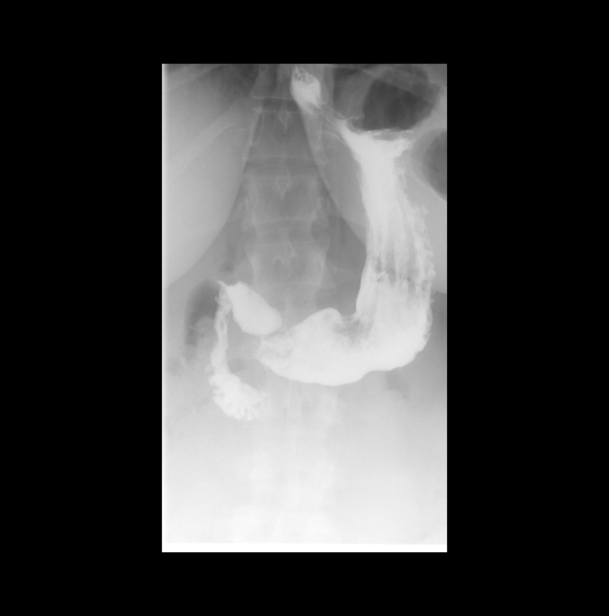

[Series 7: fluoro_barium 2fps_bw · 0.18mm/px · 1 of 1 slices shown (5 of 8)]
[im 1/1]
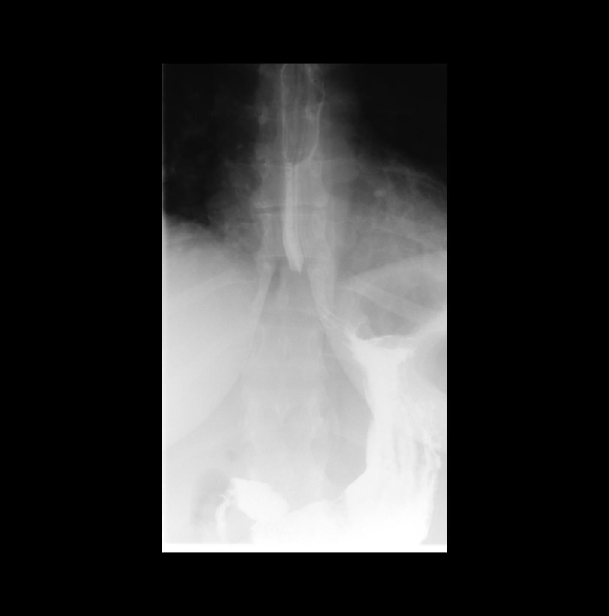

[Series 8: fluoro_barium 2fps_bw · 0.19mm/px · 2 of 2 frames shown (6 of 8)]
[frame 1/2]
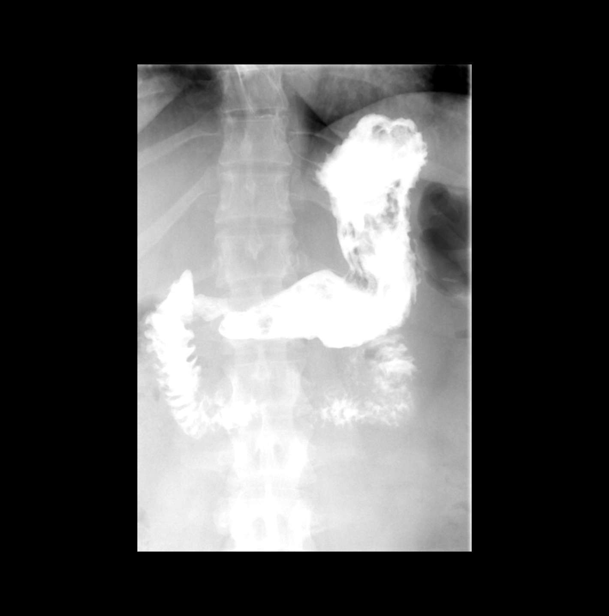
[frame 2/2]
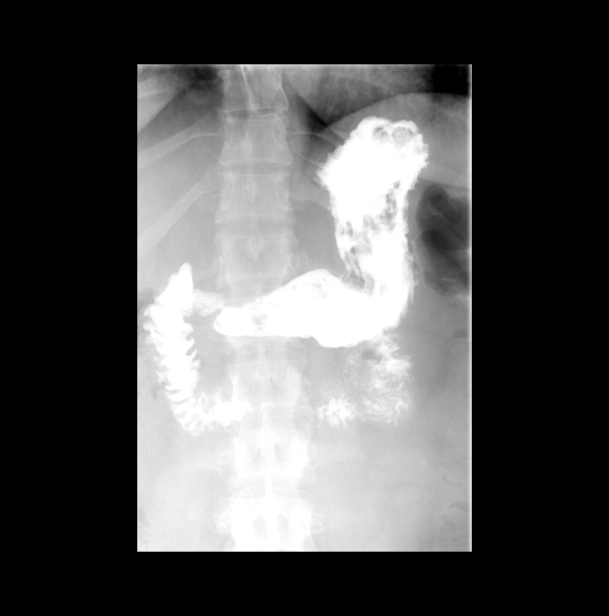

[Series 9: fluoro_barium 2fps_bw · 0.18mm/px · 1 of 1 slices shown (7 of 8)]
[im 1/1]
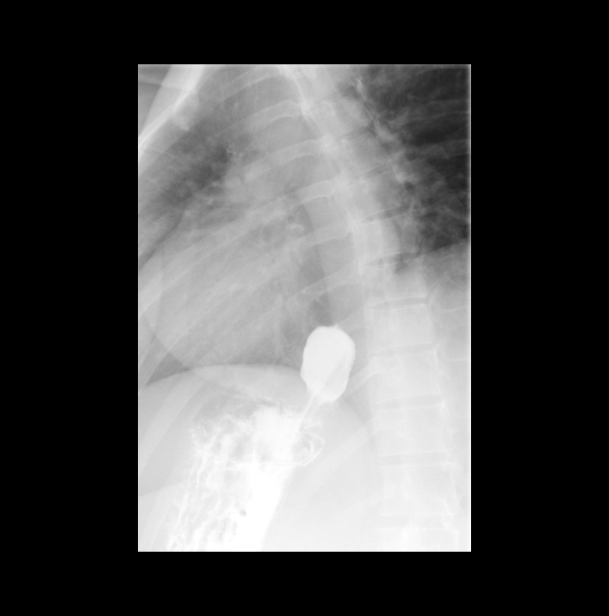

[Series 10: fluoro_barium 2fps_bw · 0.18mm/px · 2 of 2 frames shown (8 of 8)]
[frame 1/2]
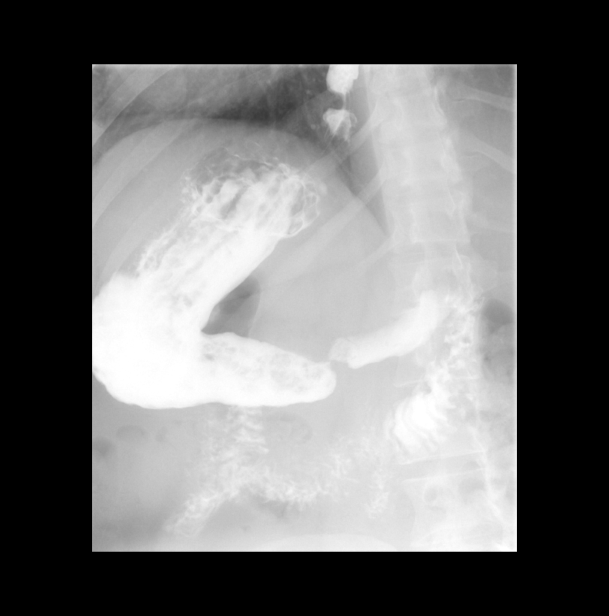
[frame 2/2]
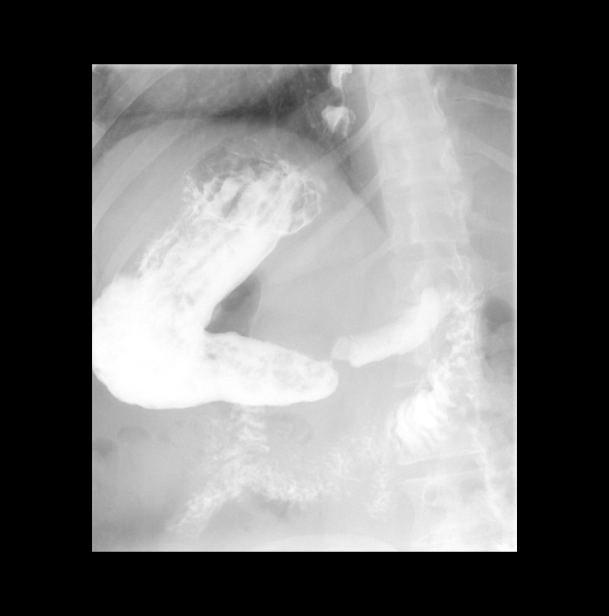

[14 of 14 positions shown; findings below may reference images not displayed]

FINDINGS: The scout radiograph demonstrates a normal bowel gas pattern. The
mucosa and motility of the esophagus are normal. No hiatal hernia or
gastroesophageal reflux.

Stomach, pylorus, duodenal bulb, duodenal C-loop appear normal.
IMPRESSION: Normal upper GI exam.

## 2021-02-05 NOTE — Progress Notes (Signed)
PATIENT: Molly [name] DOB: February 04, 1980  REASON FOR VISIT: follow up HISTORY FROM: patient  Virtual Visit via Telephone Note  I connected with Molly [name] on 02/06/21 at  9:15 AM EST by telephone and verified that I am speaking with the correct person using two identifiers.   I discussed the limitations, risks, security and privacy concerns of performing an evaluation and management service by telephone and the availability of in person appointments. I also discussed with the patient that there may be a patient responsible charge related to this service. The patient expressed understanding and agreed to proceed.   History of Present Illness:  02/06/21 ALL Mychart: Molly [name] is a 41 y.o. female here today for follow up for OSA on CPAP. She continues to do very well. She is using CPAP nightly for at least 8 hours. She does have some fatigue from time to time. No concerns with supplies or machine. She is followed closely by PCP.      02/02/20 ALL: She returns today for follow-up on OSA.  She continues CPAP therapy. She is doing very well.  She denies any concerns with her machine or supplies.  She has recently recovered from COVID-19 infection in November.  She reports that she did very well throughout her illness. She was not hospitalized.  She is back to baseline.  She reports discontinuing all medications following COVID infection.  She has not monitored blood pressure readings at home.  She was previously taken amlodipine 5 mg daily.  She is asymptomatic today.   Compliance report dated 01/02/2020 through 01/31/2020 reveals that she is CPAP 30 of the past 30 days for compliance of 100%.  She used CPAP greater than 4 hours 29 of the past 30 days for compliance of 97%.  Average usage on days used was 7 hours and 45 minutes.  Residual AHI was 0.5 on a set pressure of 15 cm of water and an EPR of 3.  There was no significant leak noted.   Observations/Objective:  Generalized: Well  developed, in no acute distress  Mentation: Alert oriented to time, place, history taking. Follows all commands speech and language fluent   Assessment and Plan:  40 y.o. year old female  has a past medical history of Anemia, Colitis, Colitis, Hypertension, Miscarriage, Moderate persistent asthma, uncomplicated (73/[id number]), Recurrent upper respiratory infection (URI), and Sleep apnea. here with    ICD-10-CM   1. OSA on CPAP  G47.33 For home use only DME continuous positive airway pressure (CPAP)   Z99.89       Molly [name] is doing very well on CPAP. She will continue CPAP nightly for at least 4 hours. Supply order updated. Healthy lifestyle habits encouraged. She will continue regular follow up with PCP. She will return to see me in 1 year.   Orders Placed This Encounter  Procedures   For home use only DME continuous positive airway pressure (CPAP)    Supplies    Order Specific Question:   Length of Need    Answer:   Lifetime    Order Specific Question:   Patient has OSA or probable OSA    Answer:   Yes    Order Specific Question:   Is the patient currently using CPAP in the home    Answer:   Yes    Order Specific Question:   Settings    Answer:   Other see comments    Order Specific Question:   CPAP supplies needed    Answer:  Mask, headgear, cushions, filters, heated tubing and water chamber    No orders of the defined types were placed in this encounter.    Follow Up Instructions:  I discussed the assessment and treatment plan with the patient. The patient was provided an opportunity to ask questions and all were answered. The patient agreed with the plan and demonstrated an understanding of the instructions.   The patient was advised to call back or seek an in-person evaluation if the symptoms worsen or if the condition fails to improve as anticipated.  I provided 15 minutes of non-face-to-face time during this encounter. Patient located at their place of residence during  [place] visit. Provider is in the office.    Debbora Presto, NP

## 2021-02-05 NOTE — Patient Instructions (Signed)

## 2021-02-06 ENCOUNTER — Telehealth (INDEPENDENT_AMBULATORY_CARE_PROVIDER_SITE_OTHER): Payer: 59 | Admitting: Family Medicine

## 2021-02-06 ENCOUNTER — Encounter: Payer: Self-pay | Admitting: Family Medicine

## 2021-02-06 DIAGNOSIS — Z9989 Dependence on other enabling machines and devices: Secondary | ICD-10-CM | POA: Diagnosis not present

## 2021-02-06 DIAGNOSIS — G4733 Obstructive sleep apnea (adult) (pediatric): Secondary | ICD-10-CM | POA: Diagnosis not present

## 2021-02-06 NOTE — Progress Notes (Signed)
CM sent to [facility]

## 2021-02-14 ENCOUNTER — Other Ambulatory Visit (HOSPITAL_BASED_OUTPATIENT_CLINIC_OR_DEPARTMENT_OTHER): Payer: Self-pay

## 2021-02-14 ENCOUNTER — Ambulatory Visit: Payer: 59 | Admitting: Family Medicine

## 2021-02-14 ENCOUNTER — Encounter: Payer: Self-pay | Admitting: Family Medicine

## 2021-02-14 VITALS — BP 127/85 | HR 84 | Ht 66.0 in | Wt 301.0 lb

## 2021-02-14 DIAGNOSIS — F411 Generalized anxiety disorder: Secondary | ICD-10-CM

## 2021-02-14 MED ORDER — BUSPIRONE HCL 15 MG PO TABS
15.0000 mg | ORAL_TABLET | Freq: Two times a day (BID) | ORAL | 3 refills | Status: DC | PRN
  Filled 2021-02-14: qty 180, 90d supply, fill #0

## 2021-02-14 NOTE — Progress Notes (Signed)
BP 127/85    Pulse 84    Ht 5\' 6"  (1.676 m)    Wt (!) 301 lb (136.5 kg)    LMP 02/11/2021    SpO2 97%    BMI 48.58 kg/m    Subjective:   Patient ID: Molly [name], female    DOB: 24-Nov-1980, 41 y.o.   MRN: [id number]  HPI: Molly [name] is a 41 y.o. female presenting on 02/14/2021 for Medical Management of Chronic Issues, Anxiety (80m f/u), and Abdominal Pain (RLQ- not sure if related to cycle. Started on 1/22)   HPI Anxiety recheck Patient is coming in today for anxiety recheck.  She says the Wellbutrin gave her a lot of indigestion and heartburn issues and even some small amount of vomiting acid and when she stopped it it went away.  She did try and restarted again and the symptoms came back.  She says her anxiety is doing okay but she just noticed that she has anxiety episodes at times when things get really built up at work or at home.  This happens once every [name] weeks and she wants to try just doing something as needed.  Patient is complaining of right lower quadrant abdominal pain.  She says its been bothering her over the past couple days her cycle also started.  Her right flank.  She denies any urinary burning or pain or blood in her urine.  She is having vaginal bleeding but she did just start her cycle.  She denies any vaginal irritation or pain.  She denies any bowel issues.  Relevant past medical, surgical, family and social history reviewed and updated as indicated. Interim medical history since our last visit reviewed. Allergies and medications reviewed and updated.  Review of Systems  Constitutional:  Negative for chills and fever.  Eyes:  Negative for visual disturbance.  Respiratory:  Negative for chest tightness and shortness of breath.   Cardiovascular:  Negative for chest pain and leg swelling.  Musculoskeletal:  Negative for back pain and gait problem.  Skin:  Negative for rash.  Neurological:  Negative for light-headedness and headaches.  Psychiatric/Behavioral:   Positive for dysphoric mood. Negative for agitation, behavioral problems, self-injury, sleep disturbance and suicidal ideas. The patient is nervous/anxious.   All other systems reviewed and are negative.  Per HPI unless specifically indicated above   Allergies as of 02/14/2021       Reactions   Other Anaphylaxis   Reaction to hay (something in [place] pig's cage), dog and mice   Dust Mite Extract    Lavender Oil Hives   Mold Extract [trichophyton]         Medication List        Accurate as of February 14, 2021 11:48 AM. If you have any questions, ask your nurse or doctor.          albuterol 108 (90 Base) MCG/ACT inhaler Commonly known as: VENTOLIN HFA Inhale 2 puffs into the lungs every 4 (four) hours as needed.   azelastine 0.1 % nasal spray Commonly known as: ASTELIN Place 2 sprays into both nostrils daily. Use in each nostril as directed   baclofen 10 MG tablet Commonly known as: LIORESAL TAKE 1 TABLET (10 MG TOTAL) BY MOUTH 3 (THREE) TIMES DAILY.   Breztri Aerosphere 160-9-4.8 MCG/ACT Aero Generic drug: Budeson-Glycopyrrol-Formoterol Inhale 2 puffs into the lungs in the morning and at bedtime.   buPROPion 300 MG 24 hr tablet Commonly known as: Wellbutrin XL Take 1 tablet (300 mg  total) by mouth daily.   busPIRone 15 MG tablet Commonly known as: BUSPAR Take 1 tablet (15 mg total) by mouth 2 (two) times daily as needed. Started by: Fransisca Kaufmann Gerry Blanchfield, MD   fluticasone 50 MCG/ACT nasal spray Commonly known as: FLONASE Place 1 spray into both nostrils daily.   levocetirizine 5 MG tablet Commonly known as: XYZAL Take 1 tablet (5 mg total) by mouth every evening.   Mounjaro 2.5 MG/0.5ML Pen Generic drug: tirzepatide Inject 2.5 mg into the skin once a week.         Objective:   BP 127/85    Pulse 84    Ht 5\' 6"  (1.676 m)    Wt (!) 301 lb (136.5 kg)    LMP 02/11/2021    SpO2 97%    BMI 48.58 kg/m   Wt Readings from Last 3 Encounters:  02/14/21 (!)  301 lb (136.5 kg)  11/15/20 294 lb (133.4 kg)  10/18/20 299 lb (135.6 kg)    Physical Exam Vitals and nursing note reviewed.  Constitutional:      General: She is not in acute distress.    Appearance: She is well-developed. She is not diaphoretic.  Eyes:     Conjunctiva/sclera: Conjunctivae normal.  Cardiovascular:     Rate and Rhythm: Normal rate and regular rhythm.     Heart sounds: Normal heart sounds. No murmur heard. Pulmonary:     Effort: Pulmonary effort is normal. No respiratory distress.     Breath sounds: Normal breath sounds. No wheezing.  Abdominal:     General: Abdomen is flat. Bowel sounds are normal. There is no distension.     Tenderness: There is no abdominal tenderness. There is no right CVA tenderness, left CVA tenderness, guarding or rebound.  Musculoskeletal:        General: No swelling or tenderness. Normal range of motion.  Skin:    General: Skin is warm and dry.     Findings: No rash.  Neurological:     Mental Status: She is alert and oriented to person, place, and time.     Coordination: Coordination normal.  Psychiatric:        Behavior: Behavior normal.    Patient is having a little bit of right lower quadrant and right lower back twinges but denies any urinary issues and says she did just start her cycle also thinks it may be related to that.  Assessment & Plan:   Problem List Items Addressed This Visit       Other   Generalized anxiety disorder - Primary   Relevant Medications   busPIRone (BUSPAR) 15 MG tablet    Reassured on abdominal pain.  We will try buspirone that she can use all the time or as needed and see if it does good for her, if not we may try other options in the future such as hydroxyzine as needed or a different maintenance medicine. Follow up plan: No follow-ups on file.  Counseling provided for all of the vaccine components No orders of the defined types were placed in this encounter.   Caryl Pina, MD [facility] 02/14/2021, 11:48 AM

## 2021-02-19 ENCOUNTER — Ambulatory Visit (INDEPENDENT_AMBULATORY_CARE_PROVIDER_SITE_OTHER): Payer: 59 | Admitting: Family

## 2021-02-19 ENCOUNTER — Encounter: Payer: Self-pay | Admitting: Family

## 2021-02-19 VITALS — BP 128/90 | HR 77 | Temp 98.6°F | Ht 66.0 in | Wt 299.0 lb

## 2021-02-19 DIAGNOSIS — R52 Pain, unspecified: Secondary | ICD-10-CM

## 2021-02-19 DIAGNOSIS — J069 Acute upper respiratory infection, unspecified: Secondary | ICD-10-CM | POA: Diagnosis not present

## 2021-02-19 DIAGNOSIS — J4540 Moderate persistent asthma, uncomplicated: Secondary | ICD-10-CM | POA: Diagnosis not present

## 2021-02-19 LAB — VERITOR FLU A/B WAIVED
Influenza A: NEGATIVE
Influenza B: NEGATIVE

## 2021-02-19 MED ORDER — PREDNISONE 10 MG (21) PO TBPK: ORAL_TABLET | ORAL | 0 refills | Status: DC

## 2021-02-19 MED ORDER — FLUTICASONE PROPIONATE 50 MCG/ACT NA SUSP: 1.0000 | Freq: Every day | NASAL | 3 refills | Status: DC

## 2021-02-19 NOTE — Patient Instructions (Signed)

## 2021-02-19 NOTE — Progress Notes (Signed)
Subjective:    Patient ID: Molly [name], female    DOB: 17-Aug-1980, 41 y.o.   MRN: [id number]   Chief Complaint  Patient presents with   Fatigue   Nasal Congestion   Ear Pain   Generalized Body Aches   Pt presents to the office today with congestion, sneezing, and ear pain that started on Saturday. Did a home COVID tests that was negative.  URI  This is a new problem. The current episode started in the past 7 days. The problem has been gradually worsening. There has been no fever. Associated symptoms include congestion, coughing, ear pain, headaches, a plugged ear sensation, rhinorrhea, sinus pain, sneezing, a sore throat and swollen glands. Pertinent negatives include no diarrhea or wheezing. She has tried acetaminophen and decongestant for the symptoms. The treatment provided mild relief.     Review of Systems  HENT:  Positive for congestion, ear pain, rhinorrhea, sinus pain, sneezing and sore throat.   Respiratory:  Positive for cough. Negative for wheezing.   Gastrointestinal:  Negative for diarrhea.  Neurological:  Positive for headaches.  All other systems reviewed and are negative.     Objective:   Physical Exam Vitals reviewed.  Constitutional:      General: She is not in acute distress.    Appearance: She is well-developed. She is obese.  HENT:     Head: Normocephalic and atraumatic.     Right Ear: No tenderness. A middle ear effusion is present. Tympanic membrane is not erythematous.     Left Ear: A middle ear effusion is present. Tympanic membrane is not erythematous.  Eyes:     Pupils: Pupils are equal, round, and reactive to light.  Neck:     Thyroid: No thyromegaly.  Cardiovascular:     Rate and Rhythm: Normal rate and regular rhythm.     Heart sounds: Normal heart sounds. No murmur heard. Pulmonary:     Effort: Pulmonary effort is normal. No respiratory distress.     Breath sounds: Normal breath sounds. No wheezing.  Abdominal:     General: Bowel  sounds are normal. There is no distension.     Palpations: Abdomen is soft.     Tenderness: There is no abdominal tenderness.  Musculoskeletal:        General: No tenderness. Normal range of motion.     Cervical back: Normal range of motion and neck supple.  Skin:    General: Skin is warm and dry.  Neurological:     Mental Status: She is alert and oriented to person, place, and time.     Cranial Nerves: No cranial nerve deficit.     Deep Tendon Reflexes: Reflexes are normal and symmetric.  Psychiatric:        Behavior: Behavior normal.        Thought Content: Thought content normal.        Judgment: Judgment normal.     BP 128/90    Pulse 77    Temp 98.6 F (37 C) (Temporal)    Ht 5\' 6"  (1.676 m)    Wt 299 lb (135.6 kg)    LMP 02/11/2021    BMI 48.26 kg/m       Assessment & Plan:  Molly [name] comes in today with chief complaint of Fatigue, Nasal Congestion, Ear Pain, and Generalized Body Aches   Diagnosis and orders addressed:  1. Body aches - Veritor Flu A/B Waived - Novel Coronavirus, NAA ([facility])  2. Viral URI with cough -  Take meds as prescribed - Use a cool mist humidifier  -Use saline nose sprays frequently -Force fluids -For any cough or congestion  Use plain Mucinex- regular strength or max strength is fine -For fever or aces or pains- take tylenol or ibuprofen. -Throat lozenges if help -Start prednisone  Flu negative, COVID pending If symptoms worsen or do not improve call and I will send in antibiotic  - fluticasone (FLONASE) 50 MCG/ACT nasal spray; Place 1 spray into both nostrils daily.  Dispense: 16 g; Refill: 3 - predniSONE (STERAPRED UNI-PAK 21 TAB) 10 MG (21) TBPK tablet; Use as directed  Dispense: 21 tablet; Refill: 0  3. Moderate persistent asthma, uncomplicated - predniSONE (STERAPRED UNI-PAK 21 TAB) 10 MG (21) TBPK tablet; Use as directed  Dispense: 21 tablet; Refill: 0   Evelina Dun, FNP

## 2021-02-20 LAB — NOVEL CORONAVIRUS, NAA: SARS-CoV-2, NAA: NOT DETECTED

## 2021-02-20 LAB — SARS-COV-2, NAA 2 DAY TAT

## 2021-02-28 ENCOUNTER — Ambulatory Visit: Payer: 59 | Admitting: Allergy & Immunology

## 2021-02-28 ENCOUNTER — Encounter: Payer: Self-pay | Admitting: Allergy & Immunology

## 2021-02-28 ENCOUNTER — Other Ambulatory Visit: Payer: Self-pay

## 2021-02-28 ENCOUNTER — Other Ambulatory Visit (HOSPITAL_BASED_OUTPATIENT_CLINIC_OR_DEPARTMENT_OTHER): Payer: Self-pay

## 2021-02-28 VITALS — BP 142/94 | HR 83 | Temp 98.7°F | Resp 16 | Ht 66.0 in | Wt 302.0 lb

## 2021-02-28 DIAGNOSIS — J4540 Moderate persistent asthma, uncomplicated: Secondary | ICD-10-CM

## 2021-02-28 DIAGNOSIS — J3089 Other allergic rhinitis: Secondary | ICD-10-CM

## 2021-02-28 DIAGNOSIS — J302 Other seasonal allergic rhinitis: Secondary | ICD-10-CM

## 2021-02-28 MED ORDER — AZELASTINE HCL 0.1 % NA SOLN
2.0000 | Freq: Every day | NASAL | 11 refills | Status: DC
  Filled 2021-02-28: qty 30, 50d supply, fill #0

## 2021-02-28 MED ORDER — BREZTRI AEROSPHERE 160-9-4.8 MCG/ACT IN AERO
2.0000 | INHALATION_SPRAY | Freq: Two times a day (BID) | RESPIRATORY_TRACT | 11 refills | Status: DC
  Filled 2021-02-28: qty 10.7, 30d supply, fill #0

## 2021-02-28 MED ORDER — ALBUTEROL SULFATE HFA 108 (90 BASE) MCG/ACT IN AERS
2.0000 | INHALATION_SPRAY | RESPIRATORY_TRACT | 1 refills | Status: DC | PRN
  Filled 2021-02-28: qty 18, 17d supply, fill #0

## 2021-02-28 MED ORDER — LEVOCETIRIZINE DIHYDROCHLORIDE 5 MG PO TABS
5.0000 mg | ORAL_TABLET | Freq: Two times a day (BID) | ORAL | 4 refills | Status: DC | PRN
  Filled 2021-02-28: qty 180, 90d supply, fill #0

## 2021-02-28 NOTE — Addendum Note (Signed)
Addended by: Valentina Shaggy on: 02/28/2021 03:08 PM   Modules accepted: Orders

## 2021-02-28 NOTE — Patient Instructions (Addendum)
1. Moderate persistent asthma, uncomplicated - Lung testing looks amazing today.   - I think we could just see you once per year unless we need you more often.  - Daily controller medication(s): NOTHING - Prior to physical activity: ProAir 2 puffs 10-15 minutes before physical activity. - Rescue medications: ProAir 4 puffs every 4-6 hours as needed - Asthma control goals:  * Full participation in all desired activities (may need albuterol before activity) * Albuterol use two time or less a week on average (not counting use with activity) * Cough interfering with sleep two time or less a month * Oral steroids no more than once a year * No hospitalizations  2. Chronic rhinitis (dust mite, cockroach, grasses, trees, and ragweed) - We will not make any medication changes at this time.   - Continue taking: Flonase one spray per nostril daily. - Continue taking: Astelin and Xyzal as needed.  - You can use an extra dose of the antihistamine, if needed, for breakthrough symptoms.  - Consider nasal saline rinses 1-2 times daily to remove allergens from the nasal cavities as well as help with mucous clearance (this is especially helpful to do before the nasal sprays are given).   3. Return in about 1 year (around 02/28/2022).    Please inform us of any Emergency Department visits, hospitalizations, or changes in symptoms. Call us before going to the ED for breathing or allergy symptoms since we might be able to fit you in for a sick visit. Feel free to contact us anytime with any questions, problems, or concerns.  It was a pleasure to see you again today!  Websites that have reliable patient information: 1. American Academy of Asthma, Allergy, and Immunology: www.aaaai.org 2. Food Allergy Research and Education (FARE): foodallergy.org 3. Mothers of Asthmatics: http://www.asthmacommunitynetwork.org 4. American College of Allergy, Asthma, and Immunology: www.acaai.org   COVID-19 Vaccine  Information can be found at: ShippingScam.co.uk For questions related to vaccine distribution or appointments, please email [email or web address] or call [phone].   We realize that you might be concerned about having an allergic reaction to the COVID19 vaccines. To help with that concern, WE ARE OFFERING THE COVID19 VACCINES IN OUR OFFICE! Ask the front desk for dates!     Like [place] on [place] and Instagram for our latest updates!       Make sure you are registered to vote! If you have moved or changed any of your contact information, you will need to get this updated before voting!  In some cases, you MAY be able to register to vote online: [email or web address]

## 2021-02-28 NOTE — Progress Notes (Signed)
FOLLOW UP  Date of Service/Encounter:  02/28/21   Assessment:   Moderate persistent asthma, uncomplicated - with worsening spirometric findings   Perennial and seasonal allergic rhinitis (dust mite, cockroach, grasses, trees, and ragweed)   Vaccine hesitancy - but no allergies to PEG or polysorbate   Concern for adverse reaction to amlodipine - with worsening cough   Molly [name] seems to be doing very well.  Her asthma has become quite quiescent since she stopped the amlodipine.  Amlodipine seems to have been the trigger for much of her cough.  She does still use her albuterol, albeit rarely.  She has needed prednisone once for URI with wheezing and coughing.  I think we can see her again in 1 year earlier if needed.  She is in agreement with this plan.  Plan/Recommendations:   1. Moderate persistent asthma, uncomplicated - Lung testing looks amazing today.   - I think we could just see you once per year unless we need you more often.  - Daily controller medication(s): NOTHING - Prior to physical activity: ProAir 2 puffs 10-15 minutes before physical activity. - Rescue medications: ProAir 4 puffs every 4-6 hours as needed - Asthma control goals:  * Full participation in all desired activities (may need albuterol before activity) * Albuterol use two time or less a week on average (not counting use with activity) * Cough interfering with sleep two time or less a month * Oral steroids no more than once a year * No hospitalizations  2. Chronic rhinitis (dust mite, cockroach, grasses, trees, and ragweed) - We will not make any medication changes at this time.   - Continue taking: Flonase one spray per nostril daily. - Continue taking: Astelin and Xyzal as needed.  - You can use an extra dose of the antihistamine, if needed, for breakthrough symptoms.  - Consider nasal saline rinses 1-2 times daily to remove allergens from the nasal cavities as well as help with mucous clearance (this  is especially helpful to do before the nasal sprays are given).   3. Return in about 1 year (around 02/28/2022).    Subjective:   Molly [name] is a 41 y.o. female presenting today for follow up of  Chief Complaint  Patient presents with   Asthma    No issues or concerns at this time.     Molly [name] has a history of the following: Patient Active Problem List   Diagnosis Date Noted   History of colitis 02/25/2019   GERD (gastroesophageal reflux disease) 11/16/2018   Severe obstructive sleep apnea-hypopnea syndrome 09/22/2018   Chronic intermittent hypoxia with obstructive sleep apnea 09/22/2018   Essential hypertension 03/02/2018   Morbid obesity ([place]) 02/02/2018   Moderate persistent asthma, uncomplicated 93/23/5573   Non-seasonal allergic rhinitis due to pollen 12/31/2017   Generalized anxiety disorder 06/17/2017    History obtained from: chart review and patient.  Molly [name] is a 41 y.o. female presenting for a follow up visit.  She was last seen in August 2022.  At that time, her lung testing looked amazing.  We continued her on albuterol 2 puffs as needed.  For her rhinitis, we continue with Dymista as well as Xyzal.  We did talk about allergy shots.  Asthma/Respiratory Symptom History: She is doing well with her breathing.  She did have prednisone with a recent URI to be on the safe side. Her last prednisone before that was two years ago. She is sleeping well at night. She is doing less than  0.3 episodes per hour.   Allergic Rhinitis Symptom History: She is doing fluticasone daily. She is doing the azelastine and the Xyzal as needed. She has an URI that she is recovering from for a couple of weeks.  She did do some Mucinex to help with this. She has not needed antibiotics at all in a few years or longer.   Blood pressure has been under better control as of late. She is not taking anything for her blood pressure right now. Her PCP is monitoring this.   She is now working days on  the weekends. She did complete the [employer] course, but apparently it needed some revamping because several of the kids did not know how to read (or more likely did not want to read, this is not entirely clear). 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   Otherwise, there have been no changes to her past medical history, surgical history, family history, or social history.    Review of Systems  Constitutional: Negative.  Negative for fever, malaise/fatigue and weight loss.  HENT: Negative.  Negative for congestion, ear discharge and ear pain.   Eyes:  Negative for pain, discharge and redness.  Respiratory:  Negative for cough, sputum production, shortness of breath and wheezing.   Cardiovascular: Negative.  Negative for chest pain and palpitations.  Gastrointestinal:  Negative for abdominal pain, diarrhea, heartburn, nausea and vomiting.  Skin: Negative.  Negative for itching and rash.  Neurological:  Negative for dizziness and headaches.  Endo/Heme/Allergies:  Negative for environmental allergies. Does not bruise/bleed easily.      Objective:   Blood pressure (!) 142/94, pulse 83, temperature 98.7 F (37.1 C), temperature source Temporal, resp. rate 16, height 5\' 6"  (1.676 m), weight (!) 302 lb (137 kg), last menstrual period 02/11/2021, SpO2 96 %. Body mass index is 48.74 kg/m.   Physical Exam:  Physical Exam Vitals reviewed.  Constitutional:      Appearance: She is well-developed and normal weight.     Comments: Talkative.   HENT:     Head: Normocephalic and atraumatic.     Right Ear: Tympanic membrane, ear canal and external ear normal.     Left Ear: Tympanic membrane, ear canal and external ear normal.     Nose: No nasal deformity, septal deviation, mucosal edema or rhinorrhea.     Right Turbinates: Enlarged, swollen and pale.     Left Turbinates: Enlarged, swollen and pale.     Right Sinus: No maxillary sinus tenderness or frontal  sinus tenderness.     Left Sinus: No maxillary sinus tenderness or frontal sinus tenderness.     Comments: No nasal polyps noted.     Mouth/Throat:     Mouth: Mucous membranes are not pale and not dry.     Pharynx: Uvula midline.  Eyes:     General: Lids are normal. No allergic shiner.       Right eye: No discharge.        Left eye: No discharge.     Conjunctiva/sclera: Conjunctivae normal.     Right eye: Right conjunctiva is not injected. No chemosis.    Left eye: Left conjunctiva is not injected. No chemosis.    Pupils: Pupils are equal, round, and reactive to light.  Cardiovascular:     Rate and Rhythm: Normal rate and regular rhythm.     Heart sounds: Normal heart sounds.  Pulmonary:     Effort: Pulmonary effort is  normal. No tachypnea, accessory muscle usage or respiratory distress.     Breath sounds: Normal breath sounds. No wheezing, rhonchi or rales.     Comments: Moving air well in all lung fields. No increased work of breathing noted.  Chest:     Chest wall: No tenderness.  Lymphadenopathy:     Cervical: No cervical adenopathy.  Skin:    General: Skin is warm.     Capillary Refill: Capillary refill takes less than 2 seconds.     Coloration: Skin is not pale.     Findings: No abrasion, erythema, petechiae or rash. Rash is not papular, urticarial or vesicular.  Neurological:     Mental Status: She is alert.  Psychiatric:        Behavior: Behavior is cooperative.     Diagnostic studies:    Spirometry: results normal (FEV1: 2.66/82%, FVC: 3.27/83%, FEV1/FVC: 81%).    Spirometry consistent with normal pattern.   Allergy Studies: none        Salvatore Marvel, MD  [facility]

## 2021-04-03 DIAGNOSIS — M7661 Achilles tendinitis, right leg: Secondary | ICD-10-CM | POA: Diagnosis not present

## 2021-04-03 DIAGNOSIS — M79671 Pain in right foot: Secondary | ICD-10-CM | POA: Diagnosis not present

## 2021-05-10 ENCOUNTER — Encounter: Payer: Self-pay | Admitting: Family Medicine

## 2021-05-23 ENCOUNTER — Ambulatory Visit: Payer: 59 | Admitting: Family Medicine

## 2021-09-27 ENCOUNTER — Encounter: Payer: Self-pay | Admitting: Family Medicine

## 2021-09-27 ENCOUNTER — Telehealth: Payer: Self-pay | Admitting: Family Medicine

## 2021-09-27 ENCOUNTER — Ambulatory Visit: Payer: 59 | Admitting: Family Medicine

## 2021-09-27 VITALS — BP 130/89 | HR 73 | Temp 98.0°F | Ht 66.0 in | Wt 290.0 lb

## 2021-09-27 DIAGNOSIS — K641 Second degree hemorrhoids: Secondary | ICD-10-CM

## 2021-09-27 MED ORDER — NITROGLYCERIN 0.4 % RE OINT: 1.0000 | TOPICAL_OINTMENT | Freq: Every day | RECTAL | 0 refills | Status: DC

## 2021-09-27 MED ORDER — LIDOCAINE 5 % EX OINT: 1.0000 | TOPICAL_OINTMENT | CUTANEOUS | 0 refills | Status: DC | PRN

## 2021-09-27 NOTE — Telephone Encounter (Signed)
Pharmacy informed.

## 2021-09-27 NOTE — Telephone Encounter (Signed)
Dr. Warrick Parisian,  Please give instructions for how often Lidocaine is to be used

## 2021-09-27 NOTE — Progress Notes (Signed)
BP 130/89   Pulse 73   Temp 98 F (36.7 C)   Ht '5\' 6"'$  (1.676 m)   Wt 290 lb (131.5 kg)   SpO2 98%   BMI 46.81 kg/m    Subjective:   Patient ID: Molly [name], female    DOB: Feb 27, 1980, 41 y.o.   MRN: [id number]  HPI: Molly [name] is a 41 y.o. female presenting on 09/27/2021 for Hemorrhoids, Rectal Pain, and Rectal Bleeding   HPI Rectal pain and bleeding Patient is coming in today for rectal pain and bleeding.  She has been dealing with some small hemorrhoids for quite some time but just over the past week they have enlarged a lot and has become very painful to where she can no longer sit on them and has to wear an donut.  She has been using hydrocortisone suppositories and Tucks pads and ice and it just does not seem to be improving.  She did have some bleeding over the past couple days.  Relevant past medical, surgical, family and social history reviewed and updated as indicated. Interim medical history since our last visit reviewed. Allergies and medications reviewed and updated.  Review of Systems  Constitutional:  Negative for chills and fever.  Eyes:  Negative for redness and visual disturbance.  Respiratory:  Negative for chest tightness and shortness of breath.   Cardiovascular:  Negative for chest pain and leg swelling.  Gastrointestinal:  Positive for anal bleeding and rectal pain.  Musculoskeletal:  Negative for back pain and gait problem.  Skin:  Negative for rash.  Neurological:  Negative for light-headedness and headaches.  Psychiatric/Behavioral:  Negative for agitation and behavioral problems.   All other systems reviewed and are negative.   Per HPI unless specifically indicated above   Allergies as of 09/27/2021       Reactions   Other Anaphylaxis   Reaction to hay (something in [place] pig's cage), dog and mice   Dust Mite Extract    Lavender Oil Hives   Mold Extract [trichophyton]         Medication List        Accurate as of September 27, 2021   9:30 AM. If you have any questions, ask your nurse or doctor.          STOP taking these medications    buPROPion 300 MG 24 hr tablet Commonly known as: Wellbutrin XL Stopped by: Fransisca Kaufmann Rondia Higginbotham, MD   fluticasone 50 MCG/ACT nasal spray Commonly known as: FLONASE Stopped by: Worthy Rancher, MD   Mounjaro 2.5 MG/0.5ML Pen Generic drug: tirzepatide Stopped by: Fransisca Kaufmann Alisson Rozell, MD   predniSONE 10 MG (21) Tbpk tablet Commonly known as: STERAPRED UNI-PAK 21 TAB Stopped by: Fransisca Kaufmann Ely Ballen, MD       TAKE these medications    albuterol 108 (90 Base) MCG/ACT inhaler Commonly known as: VENTOLIN HFA Inhale 2 puffs by mouth into the lungs every 4 (four) hours as needed.   azelastine 0.1 % nasal spray Commonly known as: ASTELIN Place 2 sprays into both nostrils daily. Use in each nostril as directed   busPIRone 15 MG tablet Commonly known as: BUSPAR Take 1 tablet (15 mg total) by mouth 2 (two) times daily as needed.   levocetirizine 5 MG tablet Commonly known as: XYZAL Take 1 tablet (5 mg total) by mouth 2 (two) times daily as needed for allergies (Can take an extra dose during flare ups.).   lidocaine 5 % ointment Commonly known as: XYLOCAINE  Apply 1 Application topically as needed. Started by: Fransisca Kaufmann Shawnta Zimbelman, MD   Nitroglycerin 0.4 % Oint Apply 1 Application topically daily. Apply 1 inch (375 mg) ointment intra-anally every 12 hours for anal fissure Started by: Fransisca Kaufmann Arlan Birks, MD         Objective:   BP 130/89   Pulse 73   Temp 98 F (36.7 C)   Ht '5\' 6"'$  (1.676 m)   Wt 290 lb (131.5 kg)   SpO2 98%   BMI 46.81 kg/m   Wt Readings from Last 3 Encounters:  09/27/21 290 lb (131.5 kg)  02/28/21 (!) 302 lb (137 kg)  02/19/21 299 lb (135.6 kg)    Physical Exam Vitals and nursing note reviewed. Exam conducted with a chaperone present.  Constitutional:      Appearance: Normal appearance. She is obese.  Genitourinary:    Rectum: External  hemorrhoid (Somewhat large painful.) present.  Neurological:     Mental Status: She is alert.       Assessment & Plan:   Problem List Items Addressed This Visit   None Visit Diagnoses     Grade II hemorrhoids    -  Primary   Relevant Medications   Nitroglycerin 0.4 % OINT   lidocaine (XYLOCAINE) 5 % ointment   Other Relevant Orders   Ambulatory referral to General Surgery     We will give nitro cream and continue hydrocortisone cream and do referral to surgery.  Follow up plan: Return if symptoms worsen or fail to improve.  Counseling provided for all of the vaccine components Orders Placed This Encounter  Procedures   Ambulatory referral to [place] Adom Schoeneck, MD [facility] 09/27/2021, 9:30 AM

## 2021-09-27 NOTE — Telephone Encounter (Signed)
1 mg topically daily as needed

## 2021-10-05 ENCOUNTER — Encounter: Payer: Self-pay | Admitting: Family Medicine

## 2021-10-05 ENCOUNTER — Other Ambulatory Visit (HOSPITAL_BASED_OUTPATIENT_CLINIC_OR_DEPARTMENT_OTHER): Payer: Self-pay

## 2021-10-05 DIAGNOSIS — B029 Zoster without complications: Secondary | ICD-10-CM

## 2021-10-05 MED ORDER — VALACYCLOVIR HCL 1 G PO TABS
1000.0000 mg | ORAL_TABLET | Freq: Two times a day (BID) | ORAL | 0 refills | Status: AC
  Filled 2021-10-05: qty 20, 10d supply, fill #0

## 2021-10-05 NOTE — Telephone Encounter (Signed)
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 Please call [phone] or [phone]

## 2021-10-23 ENCOUNTER — Ambulatory Visit: Payer: 59 | Admitting: General Surgery

## 2021-10-23 ENCOUNTER — Encounter: Payer: Self-pay | Admitting: General Surgery

## 2021-10-23 VITALS — BP 150/88 | HR 73 | Temp 98.1°F | Resp 12 | Ht 66.0 in | Wt 289.0 lb

## 2021-10-23 DIAGNOSIS — Z8719 Personal history of other diseases of the digestive system: Secondary | ICD-10-CM

## 2021-10-23 DIAGNOSIS — K625 Hemorrhage of anus and rectum: Secondary | ICD-10-CM | POA: Insufficient documentation

## 2021-10-23 DIAGNOSIS — K642 Third degree hemorrhoids: Secondary | ICD-10-CM | POA: Diagnosis not present

## 2021-10-23 NOTE — Progress Notes (Signed)
[facility] History and Physical  Reason for Referral:*** Referring Physician: ***  Chief Complaint   New Patient (Initial Visit)     Molly [name] is a 41 y.o. female.  HPI: ***.  The *** started *** and has had a duration of ***.  It is associated with ***.  The *** is improved with ***, and is made worse with ***.    Quality*** Context***  Past Medical History:  Diagnosis Date  . Anemia   . Colitis   . Colitis   . Hypertension   . Miscarriage   . Moderate persistent asthma, uncomplicated 95/18/8416  . Recurrent upper respiratory infection (URI)   . Sleep apnea     Past Surgical History:  Procedure Laterality Date  . CESAREAN SECTION    . COLONOSCOPY  07/15/2002   [facility]; normal exam except for short segment of the distal sigmoid and rectosigmoid from 15 cm to 25 cm.  Biopsies taken from this area as well as transverse and descending colon.  Pathology of a sending in descending colon benign.  Mild interstitial congestion and edema in sigmoid colon.  Focal acute and chronic colitis, nonspecific in the distal sigmoid and rectum.     Family History  Problem Relation Age of Onset  . Diabetes Mother   . Hypertension Mother   . Hyperlipidemia Mother   . Allergic rhinitis Mother   . Hypertension Father   . Sleep apnea Father   . Diabetes Maternal Grandmother   . Heart disease Maternal Grandmother   . Cancer Maternal Grandmother        sarcoma in arm  . Kidney disease Maternal Grandmother   . Allergic rhinitis Maternal Grandmother   . Heart disease Maternal Grandfather        congestive heart failure  . Kidney disease Maternal Grandfather   . Cancer Maternal Grandfather        bladder  . Hypertension Maternal Grandfather   . Heart disease Paternal Grandmother   . Heart disease Paternal Grandfather   . COPD Paternal Grandfather   . Mental illness Paternal Grandfather        PTSD  . Colon cancer Other 59  . Asthma Neg Hx   .  Eczema Neg Hx   . Urticaria Neg Hx     Social History   Tobacco Use  . Smoking status: Former    Types: Cigarettes    Quit date: 02/15/2011    Years since quitting: 10.6  . Smokeless tobacco: Never  Vaping Use  . Vaping Use: Never used  Substance Use Topics  . Alcohol use: Yes    Alcohol/week: 1.0 standard drink of alcohol    Types: 1 Cans of beer per week    Comment: occasional  . Drug use: No    Medications: {medication reviewed/display:3041432} Allergies as of 10/23/2021       Reactions   Other Anaphylaxis   Reaction to hay (something in [place] pig's cage), dog and mice   Dust Mite Extract    Lavender Oil Hives   Mold Extract [trichophyton]         Medication List        Accurate as of October 23, 2021 11:35 AM. If you have any questions, ask your nurse or doctor.          albuterol 108 (90 Base) MCG/ACT inhaler Commonly known as: VENTOLIN HFA Inhale 2 puffs by mouth into the lungs every 4 (four) hours as needed.   azelastine  0.1 % nasal spray Commonly known as: ASTELIN Place 2 sprays into both nostrils daily. Use in each nostril as directed   busPIRone 15 MG tablet Commonly known as: BUSPAR Take 1 tablet (15 mg total) by mouth 2 (two) times daily as needed.   levocetirizine 5 MG tablet Commonly known as: XYZAL Take 1 tablet (5 mg total) by mouth 2 (two) times daily as needed for allergies (Can take an extra dose during flare ups.).   lidocaine 5 % ointment Commonly known as: XYLOCAINE Apply 1 Application topically as needed.   Nitroglycerin 0.4 % Oint Apply 1 Application topically daily. Apply 1 inch (375 mg) ointment intra-anally every 12 hours for anal fissure         ROS:  {Review of Systems:30496}  Blood pressure (!) 150/88, pulse 73, temperature 98.1 F (36.7 C), temperature source Oral, resp. rate 12, height '5\' 6"'$  (1.676 m), weight 289 lb (131.1 kg), SpO2 98 %. Physical Exam  Results: No results found for this or any previous  visit (from the past 48 hour(s)).  No results found.   Assessment & Plan:  Viviane Semidey is a 41 y.o. female with *** -*** -*** -Follow up ***  All questions were answered to the satisfaction of the patient and family***.  The risk and benefits of *** were discussed including but not limited to ***.  After careful consideration, Kikuye Korenek has decided to ***.    Molly [name] 10/23/2021, 11:35 AM

## 2021-10-23 NOTE — Patient Instructions (Addendum)
Will get you referred back to Molly [name]  for the rectal bleeding, to see if they will at least do a sigmoidoscopy to look and make sure nothing else going on.   Continue hemorrhoid care and keep stools regular and soft.  Call if you think you have a thrombosed hemorrhoid and we may be able to lance it in the office.  Call with changes or if you decide to do surgery.

## 2021-10-25 ENCOUNTER — Other Ambulatory Visit: Payer: Self-pay | Admitting: *Deleted

## 2021-10-25 ENCOUNTER — Encounter: Payer: Self-pay | Admitting: *Deleted

## 2021-10-25 DIAGNOSIS — K625 Hemorrhage of anus and rectum: Secondary | ICD-10-CM

## 2021-10-25 DIAGNOSIS — Z8719 Personal history of other diseases of the digestive system: Secondary | ICD-10-CM

## 2021-10-27 NOTE — Progress Notes (Unsigned)
Referring Provider: Dettinger, Fransisca Kaufmann, MD Primary Care Physician:  Dettinger, Fransisca Kaufmann, MD Primary GI Physician: Dr. Gala Romney  No chief complaint on file.   HPI:   Molly [name] is a 41 y.o. female with history of GERD, fatty liver, nonspecific colitis noted on colonoscopy in June 2004 at [facility].  Colonoscopy 07/15/2002: Colon appeared entirely normal except for a short segment of distal sigmoid and rectosigmoid from about 15 cm to 25 cm.  Biopsies taken from this area as well as transverse and ascending colon.  Biopsies of the sigmoid colon showed mild interstitial congestion and edema, biopsies of distal sigmoid and rectum showed focal acute and chronic colitis, nonspecific.  The pathology description stated no pseudomembranes or ulcerations present.  No granulomas or crypt abscesses identified.    We last saw patient in February 2021.  Clinically, GERD was well controlled at that time with omeprazole and over-the-counter digestive enzymes.  She had no significant lower GI symptoms aside from mild fluctuations in stool consistency with recommendations to start daily fiber supplement and increase water intake.  Did not recommend updating a colonoscopy as she had no signs or symptoms of colitis and she had a recent CT in December with no colonic abnormalities.   Patient saw Dr. Constance Haw 10/23/2021 for hemorrhoids.  She is using suppositories and sitz bath's which was helping with her rectal discomfort.  She did report some bleeding with all of her hemorrhoid issues recently and had not had a colonoscopy since 2004.  Again reported some fluctuations in bowel consistency from diarrhea to constipation and some abdominal pain.  On exam, she was found to have external and internal hemorrhoids with tenderness.  Abdominal exam benign.  Patient did not want to pursue hemorrhoid surgery.  Dr. Constance Haw recommended follow-up with GI with consideration for sigmoidoscopy considering recent  rectal bleeding.     Today:     Past Medical History:  Diagnosis Date   Anemia    Colitis    Colitis    Hypertension    Miscarriage    Moderate persistent asthma, uncomplicated 38/25/0539   Recurrent upper respiratory infection (URI)    Sleep apnea     Past Surgical History:  Procedure Laterality Date   CESAREAN SECTION     COLONOSCOPY  07/15/2002   [facility]; normal exam except for short segment of the distal sigmoid and rectosigmoid from 15 cm to 25 cm.  Biopsies taken from this area as well as transverse and descending colon.  Pathology of a sending in descending colon benign.  Mild interstitial congestion and edema in sigmoid colon.  Focal acute and chronic colitis, nonspecific in the distal sigmoid and rectum.     Current Outpatient Medications  Medication Sig Dispense Refill   albuterol (VENTOLIN HFA) 108 (90 Base) MCG/ACT inhaler Inhale 2 puffs by mouth into the lungs every 4 (four) hours as needed. 18 g 1   azelastine (ASTELIN) 0.1 % nasal spray Place 2 sprays into both nostrils daily. Use in each nostril as directed 30 mL 11   busPIRone (BUSPAR) 15 MG tablet Take 1 tablet (15 mg total) by mouth 2 (two) times daily as needed. 180 tablet 3   levocetirizine (XYZAL) 5 MG tablet Take 1 tablet (5 mg total) by mouth 2 (two) times daily as needed for allergies (Can take an extra dose during flare ups.). 180 tablet 4   lidocaine (XYLOCAINE) 5 % ointment Apply 1 Application topically as needed. 35.44 g 0  Nitroglycerin 0.4 % OINT Apply 1 Application topically daily. Apply 1 inch (375 mg) ointment intra-anally every 12 hours for anal fissure 30 g 0   No current facility-administered medications for this visit.    Allergies as of 10/29/2021 - Review Complete 10/23/2021  Allergen Reaction Noted   Other Anaphylaxis 05/04/2016   Dust mite extract  03/17/2018   Lavender oil Hives 02/02/2016   Mold extract [trichophyton]  03/17/2018    Family History  Problem  Relation Age of Onset   Diabetes Mother    Hypertension Mother    Hyperlipidemia Mother    Allergic rhinitis Mother    Hypertension Father    Sleep apnea Father    Diabetes Maternal Grandmother    Heart disease Maternal Grandmother    Cancer Maternal Grandmother        sarcoma in arm   Kidney disease Maternal Grandmother    Allergic rhinitis Maternal Grandmother    Heart disease Maternal Grandfather        congestive heart failure   Kidney disease Maternal Grandfather    Cancer Maternal Grandfather        bladder   Hypertension Maternal Grandfather    Heart disease Paternal Grandmother    Heart disease Paternal Grandfather    COPD Paternal Grandfather    Mental illness Paternal Grandfather        PTSD   Colon cancer Other 3   Asthma Neg Hx    Eczema Neg Hx    Urticaria Neg Hx     Social History   Socioeconomic History   Marital status: Married    Spouse name: Not on file   Number of children: Not on file   Years of education: Not on file   Highest education level: Not on file  Occupational History   Not on file  Tobacco Use   Smoking status: Former    Types: Cigarettes    Quit date: 02/15/2011    Years since quitting: 10.7   Smokeless tobacco: Never  Vaping Use   Vaping Use: Never used  Substance and Sexual Activity   Alcohol use: Yes    Alcohol/week: 1.0 standard drink of alcohol    Types: 1 Cans of beer per week    Comment: occasional   Drug use: No   Sexual activity: Yes    Birth control/protection: None    Comment: married 6 years, trying to get pregnant  Other Topics Concern   Not on file  Social History Narrative   Not on file   Social Determinants of Health   Financial Resource Strain: Not on file  Food Insecurity: Not on file  Transportation Needs: Not on file  Physical Activity: Not on file  Stress: Not on file  Social Connections: Not on file    Review of Systems: Gen: Denies fever, chills, cold or flulike symptoms, presyncope,  syncope. CV: Denies chest pain, palpitations. Resp: Denies dyspnea, cough. GI See HPI Heme:  See HPI  Physical Exam: There were no vitals taken for this visit. General:   Alert and oriented. No distress noted. Pleasant and cooperative.  Head:  Normocephalic and atraumatic. Eyes:  Conjuctiva clear without scleral icterus. Heart:  S1, S2 present without murmurs appreciated. Lungs:  Clear to auscultation bilaterally. No wheezes, rales, or rhonchi. No distress.  Abdomen:  +BS, soft, non-tender and non-distended. No rebound or guarding. No HSM or masses noted. Msk:  Symmetrical without gross deformities. Normal posture. Extremities:  Without edema. Neurologic:  Alert and  oriented x4 Psych:  Normal mood and affect.    Assessment:     Plan:  ***   Aliene Altes, PA-C [facility] 10/29/2021

## 2021-10-29 ENCOUNTER — Encounter: Payer: Self-pay | Admitting: *Deleted

## 2021-10-29 ENCOUNTER — Encounter: Payer: Self-pay | Admitting: Gastroenterology

## 2021-10-29 ENCOUNTER — Other Ambulatory Visit (HOSPITAL_BASED_OUTPATIENT_CLINIC_OR_DEPARTMENT_OTHER): Payer: Self-pay

## 2021-10-29 ENCOUNTER — Ambulatory Visit (INDEPENDENT_AMBULATORY_CARE_PROVIDER_SITE_OTHER): Payer: 59 | Admitting: Gastroenterology

## 2021-10-29 VITALS — BP 122/83 | HR 74 | Temp 98.1°F | Ht 66.0 in | Wt 291.4 lb

## 2021-10-29 DIAGNOSIS — Z6841 Body Mass Index (BMI) 40.0 and over, adult: Secondary | ICD-10-CM

## 2021-10-29 DIAGNOSIS — R1111 Vomiting without nausea: Secondary | ICD-10-CM | POA: Diagnosis not present

## 2021-10-29 DIAGNOSIS — Z8719 Personal history of other diseases of the digestive system: Secondary | ICD-10-CM | POA: Diagnosis not present

## 2021-10-29 DIAGNOSIS — R198 Other specified symptoms and signs involving the digestive system and abdomen: Secondary | ICD-10-CM | POA: Insufficient documentation

## 2021-10-29 DIAGNOSIS — K625 Hemorrhage of anus and rectum: Secondary | ICD-10-CM

## 2021-10-29 MED ORDER — DICYCLOMINE HCL 10 MG PO CAPS
10.0000 mg | ORAL_CAPSULE | Freq: Three times a day (TID) | ORAL | 1 refills | Status: AC | PRN
  Filled 2021-10-29: qty 90, 30d supply, fill #0

## 2021-10-29 MED ORDER — CLENPIQ 10-3.5-12 MG-GM -GM/175ML PO SOLN
1.0000 | ORAL | 0 refills | Status: DC
  Filled 2021-10-29: qty 350, 1d supply, fill #0

## 2021-10-29 NOTE — Patient Instructions (Addendum)
We will arrange for you to have a colonoscopy in the near future with Dr. Gala Romney.  Have blood work completed at [facility]  I suspect your alternating constipation and diarrhea as well as abdominal cramping is secondary to irritable bowel syndrome. You may use dicyclomine 10 mg up to 3 times daily as needed for abdominal cramping and diarrhea.  Hold in the setting of constipation. You may also use over-the-counter Ibgard. Try taking Lactaid tablets prior to dairy consumption.  For your intermittent vomiting, suspect this may be secondary to reflux.  Try keeping a dietary log to see if you can identify specific triggers. You may take over the counter omeprazole 20 mg before eating common triggers such as spicy and fatty foods (mexican foods etc).   General reflux recommendations:  Avoid fried, fatty, greasy, spicy, citrus foods. Avoid caffeine and carbonated beverages. Avoid chocolate. Try eating 4-6 small meals a day rather than 3 large meals. Eat slowly and chew well.  Do not eat within 3 hours of laying down. Prop head of bed up on wood or bricks to create a 6 inch incline.  We are also placing a referral to healthy weight and wellness.    We will plan to see you back in the office after your colonoscopy. Do not hesitate to call with questions or concerns prior.   It was good to see you again today!   Aliene Altes, PA-C [facility]

## 2021-10-30 ENCOUNTER — Encounter: Payer: Self-pay | Admitting: *Deleted

## 2021-11-07 DIAGNOSIS — R198 Other specified symptoms and signs involving the digestive system and abdomen: Secondary | ICD-10-CM | POA: Diagnosis not present

## 2021-11-08 LAB — IGA: Immunoglobulin A: 241 mg/dL (ref 47–310)

## 2021-11-08 LAB — TISSUE TRANSGLUTAMINASE, IGA: (tTG) Ab, IgA: <1 U/mL

## 2021-11-09 ENCOUNTER — Other Ambulatory Visit (HOSPITAL_BASED_OUTPATIENT_CLINIC_OR_DEPARTMENT_OTHER): Payer: Self-pay

## 2021-11-09 MED ORDER — FLUARIX QUADRIVALENT 0.5 ML IM SUSY
PREFILLED_SYRINGE | INTRAMUSCULAR | 0 refills | Status: DC
  Filled 2021-11-09: qty 0.5, 1d supply, fill #0

## 2021-11-20 ENCOUNTER — Ambulatory Visit: Payer: 59 | Admitting: Gastroenterology

## 2021-11-23 ENCOUNTER — Other Ambulatory Visit (HOSPITAL_COMMUNITY): Payer: 59

## 2021-11-26 ENCOUNTER — Telehealth (INDEPENDENT_AMBULATORY_CARE_PROVIDER_SITE_OTHER): Payer: Self-pay | Admitting: *Deleted

## 2021-11-26 ENCOUNTER — Encounter (HOSPITAL_COMMUNITY)
Admission: RE | Admit: 2021-11-26 | Discharge: 2021-11-26 | Disposition: A | Discharge status: Home / Self Care | Payer: 59 | Source: Ambulatory Visit | Attending: Internal Medicine | Admitting: Internal Medicine

## 2021-11-26 DIAGNOSIS — Z01818 Encounter for other preprocedural examination: Secondary | ICD-10-CM

## 2021-11-26 NOTE — Telephone Encounter (Signed)
-----   Message from Molly [name] sent at 11/26/2021  1:08 PM EST ----- This patient could not come for her PAT today because she is sick.  I am pulling her into the depot and LM for her to call your office to reschedule.  Thanks,

## 2021-11-26 NOTE — Patient Instructions (Signed)
Molly [name]  11/26/2021     '@[email or web address]'$ @   Your procedure is scheduled on 11/28/2021.  Report to [facility] at 9:30 A.M.  Call this number if you have problems the morning of surgery:  [phone]  If you experience any cold or flu symptoms such as cough, fever, chills, shortness of breath, etc. between now and your scheduled surgery, please notify us at the above number.   Remember:   Please follow the diet and prep instructions from Dr Roseanne Kaufman office.      Take these medicines the morning of surgery with A SIP OF WATER : Albuterol Astelin Xyzal Prilosec    Do not wear jewelry, make-up or nail polish.  Do not wear lotions, powders, or perfumes, or deodorant.  Do not shave 48 hours prior to surgery.  Men may shave face and neck.  Do not bring valuables to the hospital.  [facility] is not responsible for any belongings or valuables.  Contacts, dentures or bridgework may not be worn into surgery.  Leave your suitcase in the car.  After surgery it may be brought to your room.  For patients admitted to the hospital, discharge time will be determined by your treatment team.  Patients discharged the day of surgery will not be allowed to drive home.   Name and phone number of your driver:   Family Special instructions:  N/A  Please read over the following fact sheets that you were given.   Care and Recovery After Surgery   Monitored Anesthesia Care Anesthesia refers to the techniques, procedures, and medicines that help a person stay safe and comfortable during surgery. Monitored anesthesia care, or sedation, is one type of anesthesia. You may have sedation if you do not need to be asleep for your procedure. Procedures that use sedation may include: Surgery to remove cataracts from your eyes. A dental procedure. A biopsy. This is when a tissue sample is removed and looked at under a microscope. You will be watched closely during your procedure. Your level of  sedation or type of anesthesia may be changed to fit your needs. Tell a health care provider about: Any allergies you have. All medicines you are taking, including vitamins, herbs, eye drops, creams, and over-the-counter medicines. Any problems you or family members have had with anesthesia. Any bleeding problems you have. Any surgeries you have had. Any medical conditions or illnesses you have. This includes sleep apnea, cough, fever, or the flu. Whether you are pregnant or may be pregnant. Whether you use cigarettes, alcohol, or drugs. Any use of steroids, whether by mouth or as a cream. What are the risks? Your health care provider will talk with you about risks. These may include: Getting too much medicine (oversedation). Nausea. Allergic reactions to medicines. Trouble breathing. If this happens, a breathing tube may be used to help you breathe. It will be removed when you are awake and breathing on your own. Heart trouble. Lung trouble. Confusion that gets better with time (emergence delirium). What happens before the procedure? When to stop eating and drinking Follow instructions from your health care provider about what you may eat and drink. These may include: 8 hours before your procedure Stop eating most foods. Do not eat meat, fried foods, or fatty foods. Eat only light foods, such as toast or crackers. All liquids are okay except energy drinks and alcohol. 6 hours before your procedure Stop eating. Drink only clear liquids, such as water, clear fruit juice, black coffee, plain  tea, and sports drinks. Do not drink energy drinks or alcohol. 2 hours before your procedure Stop drinking all liquids. You may be allowed to take medicines with small sips of water. If you do not follow your health care provider's instructions, your procedure may be delayed or canceled. Medicines Ask your health care provider about: Changing or stopping your regular medicines. These include  any diabetes medicines or blood thinners you take. Taking medicines such as aspirin and ibuprofen. These medicines can thin your blood. Do not take them unless your health care provider tells you to. Taking over-the-counter medicines, vitamins, herbs, and supplements. Testing You may have an exam or testing. You may have a blood or urine sample taken. General instructions Do not use any products that contain nicotine or tobacco for at least 4 weeks before the procedure. These products include cigarettes, chewing tobacco, and vaping devices, such as e-cigarettes. If you need help quitting, ask your health care provider. If you will be going home right after the procedure, plan to have a responsible adult: Take you home from the hospital or clinic. You will not be allowed to drive. Care for you for the time you are told. What happens during the procedure?  Your blood pressure, heart rate, breathing, level of pain, and blood oxygen level will be monitored. An IV will be inserted into one of your veins. You may be given: A sedative. This helps you relax. Anesthesia. This will: Numb certain areas of your body. Make you fall asleep for surgery. You will be given medicines as needed to keep you comfortable. The more medicine you are given, the deeper your level of sedation will be. Your level of sedation may be changed to fit your needs. There are three levels of sedation: Mild sedation. At this level, you may feel awake and relaxed. You will be able to follow directions. Moderate sedation. At this level, you will be sleepy. You may not remember the procedure. Deep sedation. At this level, you will be asleep. You will not remember the procedure. How you get the medicines will depend on your age and the procedure. They may be given as: A pill. This may be taken by mouth (orally) or inserted into the rectum. An injection. This may be into a vein or muscle. A spray through the nose. After your  procedure is over, the medicine will be stopped. The procedure may vary among health care providers and hospitals. What happens after the procedure? Your blood pressure, heart rate, breathing rate, and blood oxygen level will be monitored until you leave the hospital or clinic. You may feel sleepy, clumsy, or nauseous. You may not remember what happened during or after the procedure. Sedation can affect you for several hours. Do not drive or use machinery until your health care provider says that it is safe. This information is not intended to replace advice given to you by your health care provider. Make sure you discuss any questions you have with your health care provider. Document Revised: 06/04/2021 Document Reviewed: 06/04/2021 Elsevier Patient Education  [place].    Colonoscopy, Adult A colonoscopy is a procedure to look at the entire large intestine. This procedure is done using a long, thin, flexible tube that has a camera on the end. You may have a colonoscopy: As a part of normal colorectal screening. If you have certain symptoms, such as: A low number of red blood cells in your blood (anemia). Diarrhea that does not go away. Pain in  your abdomen. Blood in your stool. A colonoscopy can help screen for and diagnose medical problems, including: An abnormal growth of cells or tissue (tumor). Abnormal growths within the lining of your intestine (polyps). Inflammation. Areas of bleeding. Tell your health care provider about: Any allergies you have. All medicines you are taking, including vitamins, herbs, eye drops, creams, and over-the-counter medicines. Any problems you or family members have had with anesthetic medicines. Any bleeding problems you have. Any surgeries you have had. Any medical conditions you have. Any problems you have had with having bowel movements. Whether you are pregnant or may be pregnant. What are the risks? Generally, this is a safe  procedure. However, problems may occur, including: Bleeding. Damage to your intestine. Allergic reactions to medicines given during the procedure. Infection. This is rare. What happens before the procedure? Eating and drinking restrictions Follow instructions from your health care provider about eating or drinking restrictions, which may include: A few days before the procedure: Follow a low-fiber diet. Avoid nuts, seeds, dried fruit, raw fruits, and vegetables. 1-3 days before the procedure: Eat only gelatin dessert or ice pops. Drink only clear liquids, such as water, clear juice, clear broth or bouillon, black coffee or tea, or clear soft drinks or sports drinks. Avoid liquids that contain red or purple dye. The day of the procedure: Do not eat solid foods. You may continue to drink clear liquids until up to 2 hours before the procedure. Do not eat or drink anything starting 2 hours before the procedure, or within the time period that your health care provider recommends. Bowel prep If you were prescribed a bowel prep to take by mouth (orally) to clean out your colon: Take it as told by your health care provider. Starting the day before your procedure, you will need to drink a large amount of liquid medicine. The liquid will cause you to have many bowel movements of loose stool until your stool becomes almost clear or light green. If your skin or the opening between the buttocks (anus) gets irritated from diarrhea, you may relieve the irritation using: Wipes with medicine in them, such as adult wet wipes with aloe and vitamin E. A product to soothe skin, such as petroleum jelly. If you vomit while drinking the bowel prep: Take a break for up to 60 minutes. Begin the bowel prep again. Call your health care provider if you keep vomiting or you cannot take the bowel prep without vomiting. To clean out your colon, you may also be given: Laxative medicines. These help you have a bowel  movement. Instructions for enema use. An enema is liquid medicine injected into your rectum. Medicines Ask your health care provider about: Changing or stopping your regular medicines or supplements. This is especially important if you are taking iron supplements, diabetes medicines, or blood thinners. Taking medicines such as aspirin and ibuprofen. These medicines can thin your blood. Do not take these medicines unless your health care provider tells you to take them. Taking over-the-counter medicines, vitamins, herbs, and supplements. General instructions Ask your health care provider what steps will be taken to help prevent infection. These may include washing skin with a germ-killing soap. If you will be going home right after the procedure, plan to have a responsible adult: Take you home from the hospital or clinic. You will not be allowed to drive. Care for you for the time you are told. What happens during the procedure?  An IV will be inserted into one of  your veins. You will be given a medicine to make you fall asleep (general anesthetic). You will lie on your side with your knees bent. A lubricant will be put on the tube. Then the tube will be: Inserted into your anus. Gently eased through all parts of your large intestine. Air will be sent into your colon to keep it open. This may cause some pressure or cramping. Images will be taken with the camera and will appear on a screen. A small tissue sample may be removed to be looked at under a microscope (biopsy). The tissue may be sent to a lab for testing if any signs of problems are found. If small polyps are found, they may be removed and checked for cancer cells. When the procedure is finished, the tube will be removed. The procedure may vary among health care providers and hospitals. What happens after the procedure? Your blood pressure, heart rate, breathing rate, and blood oxygen level will be monitored until you leave the  hospital or clinic. You may have a small amount of blood in your stool. You may pass gas and have mild cramping or bloating in your abdomen. This is caused by the air that was used to open your colon during the exam. If you were given a sedative during the procedure, it can affect you for several hours. Do not drive or operate machinery until your health care provider says that it is safe. It is up to you to get the results of your procedure. Ask your health care provider, or the department that is doing the procedure, when your results will be ready. Summary A colonoscopy is a procedure to look at the entire large intestine. Follow instructions from your health care provider about eating and drinking before the procedure. If you were prescribed an oral bowel prep to clean out your colon, take it as told by your health care provider. During the colonoscopy, a flexible tube with a camera on its end is inserted into the anus and then passed into all parts of the large intestine. This information is not intended to replace advice given to you by your health care provider. Make sure you discuss any questions you have with your health care provider. Document Revised: 01/01/2021 Document Reviewed: 08/30/2020 Elsevier Patient Education  [place].

## 2021-11-28 ENCOUNTER — Encounter (HOSPITAL_COMMUNITY): Admission: RE | Payer: Self-pay | Source: Home / Self Care

## 2021-11-28 ENCOUNTER — Ambulatory Visit (HOSPITAL_COMMUNITY): Admission: RE | Admit: 2021-11-28 | Payer: 59 | Source: Home / Self Care | Admitting: Internal Medicine

## 2021-11-28 SURGERY — COLONOSCOPY WITH PROPOFOL
Anesthesia: Monitor Anesthesia Care

## 2022-01-01 DIAGNOSIS — G4733 Obstructive sleep apnea (adult) (pediatric): Secondary | ICD-10-CM | POA: Diagnosis not present

## 2022-01-10 ENCOUNTER — Ambulatory Visit: Payer: 59 | Admitting: Family Medicine

## 2022-01-10 ENCOUNTER — Encounter: Payer: Self-pay | Admitting: Family Medicine

## 2022-01-10 DIAGNOSIS — Z20828 Contact with and (suspected) exposure to other viral communicable diseases: Secondary | ICD-10-CM | POA: Diagnosis not present

## 2022-01-10 DIAGNOSIS — J069 Acute upper respiratory infection, unspecified: Secondary | ICD-10-CM | POA: Diagnosis not present

## 2022-01-10 MED ORDER — OSELTAMIVIR PHOSPHATE 75 MG PO CAPS: 75.0000 mg | ORAL_CAPSULE | Freq: Two times a day (BID) | ORAL | 0 refills | Status: AC

## 2022-01-10 MED ORDER — BENZONATATE 100 MG PO CAPS: 200.0000 mg | ORAL_CAPSULE | Freq: Three times a day (TID) | ORAL | 0 refills | Status: DC | PRN

## 2022-01-10 MED ORDER — PREDNISONE 20 MG PO TABS: 40.0000 mg | ORAL_TABLET | Freq: Every day | ORAL | 0 refills | Status: AC

## 2022-01-10 NOTE — Progress Notes (Signed)
Virtual Visit via telephone Note Due to COVID-19 pandemic this visit was conducted virtually. This visit type was conducted due to national recommendations for restrictions regarding the COVID-19 Pandemic (e.g. social distancing, sheltering in place) in an effort to limit this patient's exposure and mitigate transmission in our community. All issues noted in this document were discussed and addressed.  A physical exam was not performed with this format.   I connected with [name] on 01/10/2022 at 0855 by telephone and verified that I am speaking with the correct person using two identifiers. [name] is currently located at home and family is currently with them during visit. The provider, Monia Pouch, FNP is located in their office at time of visit.  I discussed the limitations, risks, security and privacy concerns of performing an evaluation and management service by virtual visit and the availability of in person appointments. I also discussed with the patient that there may be a patient responsible charge related to this service. The patient expressed understanding and agreed to proceed.  Subjective:  Patient ID: Molly [name], female    DOB: 04-20-1980, 41 y.o.   MRN: [id number]  Chief Complaint:  Fever and URI   HPI: Molly [name] is a 41 y.o. female presenting on 01/10/2022 for Fever and URI   Fever  This is a new problem. Episode onset: 2 days ago. The problem has been waxing and waning. Associated symptoms include congestion, coughing, ear pain, headaches and wheezing. Pertinent negatives include no abdominal pain, chest pain, diarrhea, muscle aches, nausea, rash, sleepiness, sore throat, urinary pain or vomiting. She has tried acetaminophen for the symptoms. The treatment provided no relief.  Risk factors: sick contacts   Risk factors comment:  Son has influenza URI  This is a new problem. Episode onset: 2 days ago. The problem has been waxing and waning. Associated  symptoms include congestion, coughing, ear pain, headaches, a plugged ear sensation and wheezing. Pertinent negatives include no abdominal pain, chest pain, diarrhea, dysuria, joint pain, joint swelling, nausea, neck pain, rash, rhinorrhea, sinus pain, sneezing, sore throat, swollen glands or vomiting. She has tried decongestant and acetaminophen for the symptoms. The treatment provided no relief.     Relevant past medical, surgical, family, and social history reviewed and updated as indicated.  Allergies and medications reviewed and updated.   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  Biopsies taken from this area as well as transverse and descending colon.  Pathology of a sending in descending colon benign.  Mild interstitial congestion and edema in sigmoid colon.  Focal acute and chronic colitis, nonspecific in the distal sigmoid and rectum.     Social History   Socioeconomic History   Marital status: Married    Spouse name: Not on file   Number of children: Not on file   Years of education: Not on file   Highest education level: Not on file  Occupational History   Not on file  Tobacco Use   Smoking status:  Former    Types: Cigarettes    Quit date: 02/15/2011    Years since quitting: 10.9   Smokeless tobacco: Never  Vaping Use   Vaping Use: Never used  Substance and Sexual Activity   Alcohol use: Yes    Alcohol/week: 1.0 standard drink of alcohol    Types: 1 Cans of beer per week    Comment: occasional   Drug use: No    Sexual activity: Yes    Birth control/protection: None    Comment: married 6 years, trying to get pregnant  Other Topics Concern   Not on file  Social History Narrative   Not on file   Social Determinants of Health   Financial Resource Strain: Not on file  Food Insecurity: Not on file  Transportation Needs: Not on file  Physical Activity: Not on file  Stress: Not on file  Social Connections: Not on file  Intimate Partner Violence: Not on file    Outpatient Encounter Medications as of 01/10/2022  Medication Sig   benzonatate (TESSALON PERLES) 100 MG capsule Take 2 capsules (200 mg total) by mouth 3 (three) times daily as needed for cough.   oseltamivir (TAMIFLU) 75 MG capsule Take 1 capsule (75 mg total) by mouth 2 (two) times daily for 5 days.   predniSONE (DELTASONE) 20 MG tablet Take 2 tablets (40 mg total) by mouth daily with breakfast for 5 days.   albuterol (VENTOLIN HFA) 108 (90 Base) MCG/ACT inhaler Inhale 2 puffs by mouth into the lungs every 4 (four) hours as needed. (Patient taking differently: Inhale 2 puffs into the lungs every 4 (four) hours as needed for wheezing or shortness of breath.)   azelastine (ASTELIN) 0.1 % nasal spray Place 2 sprays into both nostrils daily. Use in each nostril as directed (Patient taking differently: Place 2 sprays into both nostrils daily as needed for allergies. Use in each nostril as directed)   dicyclomine (BENTYL) 10 MG capsule Take 1 capsule (10 mg total) by mouth 3 (three) times daily as needed (for abdominal cramping or loose stools. Hold in setting of constipation).   influenza vac split quadrivalent PF (FLUARIX QUADRIVALENT) 0.5 ML injection Inject into the muscle.   levocetirizine (XYZAL) 5 MG tablet Take 1 tablet (5 mg total) by mouth 2 (two) times daily as needed for allergies (Can take an extra dose during flare ups.).   lidocaine (XYLOCAINE) 5 % ointment Apply 1 Application topically as needed. (Patient taking differently: Apply 1  Application topically daily as needed for mild pain.)   Multiple Vitamins-Minerals (MULTIVITAMIN WITH MINERALS) tablet Take 1 tablet by mouth daily.   omeprazole (PRILOSEC) 40 MG capsule Take 40 mg by mouth daily.   PE-Doxylamine-DM-GG-APAP (SEVERE COLD & FLU DAY/NIGHT PO) Take 2 tablets by mouth every 4 (four) hours as needed (sinus).   Sod Picosulfate-Mag Ox-Cit Acd (CLENPIQ) 10-3.5-12 MG-GM -GM/175ML SOLN Take 1 kit by mouth as directed.   No facility-administered encounter medications on file as of 01/10/2022.    Allergies  Allergen Reactions   Other Anaphylaxis    Reaction to hay (something in [place] pig's cage), dog and mice   Dust Mite Extract    Lavender Oil Hives   Mold Extract [Trichophyton]     Review of Systems  Constitutional:  Positive for activity change, chills, fatigue and fever. Negative for appetite change, diaphoresis and unexpected weight change.  HENT:  Positive for congestion and ear pain. Negative for dental problem, drooling, ear discharge, facial swelling, hearing loss, mouth  sores, nosebleeds, postnasal drip, rhinorrhea, sinus pressure, sinus pain, sneezing, sore throat, tinnitus, trouble swallowing and voice change.   Respiratory:  Positive for cough and wheezing. Negative for apnea, choking, chest tightness, shortness of breath and stridor.   Cardiovascular:  Negative for chest pain, palpitations and leg swelling.  Gastrointestinal:  Negative for abdominal distention, abdominal pain, diarrhea, nausea and vomiting.  Genitourinary:  Negative for decreased urine volume, difficulty urinating and dysuria.  Musculoskeletal:  Negative for arthralgias, joint pain and neck pain.  Skin:  Negative for rash.  Neurological:  Positive for headaches. Negative for dizziness, tremors, seizures, syncope, facial asymmetry, speech difficulty, weakness, light-headedness and numbness.  Psychiatric/Behavioral:  Negative for confusion.   All other systems reviewed and are  negative.        Observations/Objective: No vital signs or physical exam, this was a virtual health encounter.  Pt alert and oriented, answers all questions appropriately, and able to speak in full sentences.    Assessment and Plan: Tynetta was seen today for fever and uri.  Diagnoses and all orders for this visit:  URI with cough and congestion Exposure to influenza  Cough and congestion with wheezing known exposure to influenza A. Will test for RSV, COVID and influenza. But due to symptoms, will treat with below. Pt aware to continue symptomatic care at home. Report new, worsening, or persistent symptoms.  -     predniSONE (DELTASONE) 20 MG tablet; Take 2 tablets (40 mg total) by mouth daily with breakfast for 5 days. -     COVID-19, Flu A+B and RSV -     benzonatate (TESSALON PERLES) 100 MG capsule; Take 2 capsules (200 mg total) by mouth 3 (three) times daily as needed for cough. -     oseltamivir (TAMIFLU) 75 MG capsule; Take 1 capsule (75 mg total) by mouth 2 (two) times daily for 5 days.    Follow Up Instructions: Return if symptoms worsen or fail to improve.    I discussed the assessment and treatment plan with the patient. The patient was provided an opportunity to ask questions and all were answered. The patient agreed with the plan and demonstrated an understanding of the instructions.   The patient was advised to call back or seek an in-person evaluation if the symptoms worsen or if the condition fails to improve as anticipated.  The above assessment and management plan was discussed with the patient. The patient verbalized understanding of and has agreed to the management plan. Patient is aware to call the clinic if they develop any new symptoms or if symptoms persist or worsen. Patient is aware when to return to the clinic for a follow-up visit. Patient educated on when it is appropriate to go to the emergency department.    I provided 15 minutes of time during this  telephone encounter.   Monia Pouch, FNP-C [facility] [address] [place], [place] [address] [phone] 01/10/2022

## 2022-01-11 LAB — COVID-19, FLU A+B AND RSV
Influenza A, NAA: NOT DETECTED
Influenza B, NAA: NOT DETECTED
RSV, NAA: NOT DETECTED
SARS-CoV-2, NAA: NOT DETECTED

## 2022-01-31 NOTE — Patient Instructions (Signed)

## 2022-01-31 NOTE — Progress Notes (Signed)
PATIENT: Molly [name] DOB: March 11, 1980  REASON FOR VISIT: follow up HISTORY FROM: patient  Virtual Visit via Telephone Note  I connected with Molly [name] on 02/05/22 at  8:15 AM EST by telephone and verified that I am speaking with the correct person using two identifiers.   I discussed the limitations, risks, security and privacy concerns of performing an evaluation and management service by telephone and the availability of in person appointments. I also discussed with the patient that there may be a patient responsible charge related to this service. The patient expressed understanding and agreed to proceed.   History of Present Illness:  02/05/22 ALL ([facility]): Molly [name] returns for follow up for OSA on CPAP. She continues to do well. She is using CPAP nightly for about 8 hours. She continues to do well. She is using therapy nightly for at least 8 hours. She denies concerns with machine or supplies.     02/06/2021 ALL ([facility]): Molly [name] is a 42 y.o. female here today for follow up for OSA on CPAP. She continues to do very well. She is using CPAP nightly for at least 8 hours. She does have some fatigue from time to time. No concerns with supplies or machine. She is followed closely by PCP.      02/02/20 ALL: She returns today for follow-up on OSA.  She continues CPAP therapy. She is doing very well.  She denies any concerns with her machine or supplies.  She has recently recovered from COVID-19 infection in November.  She reports that she did very well throughout her illness. She was not hospitalized.  She is back to baseline.  She reports discontinuing all medications following COVID infection.  She has not monitored blood pressure readings at home.  She was previously taken amlodipine 5 mg daily.  She is asymptomatic today.   Compliance report dated 01/02/2020 through 01/31/2020 reveals that she is CPAP 30 of the past 30 days for compliance of 100%.  She used CPAP greater than  4 hours 29 of the past 30 days for compliance of 97%.  Average usage on days used was 7 hours and 45 minutes.  Residual AHI was 0.5 on a set pressure of 15 cm of water and an EPR of 3.  There was no significant leak noted.   Observations/Objective:  Generalized: Well developed, in no acute distress  Mentation: Alert oriented to time, place, history taking. Follows all commands speech and language fluent   Assessment and Plan:  42 y.o. year old female  has a past medical history of Anemia, Colitis, Hypertension, Miscarriage, Moderate persistent asthma, uncomplicated (63/87/5643), Recurrent upper respiratory infection (URI), and Sleep apnea. here with    ICD-10-CM   1. OSA on CPAP  G47.33 For home use only DME continuous positive airway pressure (CPAP)     Molly [name] is doing very well on CPAP. She will continue CPAP nightly for at least 4 hours. Supply order updated. Healthy lifestyle habits encouraged. She will continue regular follow up with PCP. She will return to see me in 1 year.   Orders Placed This Encounter  Procedures   For home use only DME continuous positive airway pressure (CPAP)    Supplies    Order Specific Question:   Length of Need    Answer:   Lifetime    Order Specific Question:   Patient has OSA or probable OSA    Answer:   Yes    Order Specific Question:   Is the patient  currently using CPAP in the home    Answer:   Yes    Order Specific Question:   Settings    Answer:   Other see comments    Order Specific Question:   CPAP supplies needed    Answer:   Mask, headgear, cushions, filters, heated tubing and water chamber    No orders of the defined types were placed in this encounter.    Follow Up Instructions:  I discussed the assessment and treatment plan with the patient. The patient was provided an opportunity to ask questions and all were answered. The patient agreed with the plan and demonstrated an understanding of the instructions.   The patient was  advised to call back or seek an in-person evaluation if the symptoms worsen or if the condition fails to improve as anticipated.  I provided 15 minutes of non-face-to-face time during this encounter. Patient located at their place of residence during [place] visit. Provider is in the office.    Debbora Presto, NP

## 2022-02-05 ENCOUNTER — Telehealth: Payer: Commercial Managed Care - PPO | Admitting: Family Medicine

## 2022-02-05 ENCOUNTER — Encounter: Payer: Self-pay | Admitting: Family Medicine

## 2022-02-05 DIAGNOSIS — G4733 Obstructive sleep apnea (adult) (pediatric): Secondary | ICD-10-CM

## 2022-02-12 ENCOUNTER — Telehealth: Payer: 59 | Admitting: Family Medicine

## 2022-04-05 ENCOUNTER — Other Ambulatory Visit (HOSPITAL_BASED_OUTPATIENT_CLINIC_OR_DEPARTMENT_OTHER): Payer: Self-pay

## 2022-04-05 ENCOUNTER — Other Ambulatory Visit: Payer: Self-pay | Admitting: Allergy & Immunology

## 2022-04-05 MED ORDER — ALBUTEROL SULFATE HFA 108 (90 BASE) MCG/ACT IN AERS
2.0000 | INHALATION_SPRAY | RESPIRATORY_TRACT | 1 refills | Status: DC | PRN
  Filled 2022-04-05: qty 6.7, 17d supply, fill #0

## 2022-08-28 ENCOUNTER — Ambulatory Visit: Payer: Commercial Managed Care - PPO | Admitting: Internal Medicine

## 2022-08-28 ENCOUNTER — Encounter: Payer: Self-pay | Admitting: Internal Medicine

## 2022-08-28 ENCOUNTER — Other Ambulatory Visit (HOSPITAL_BASED_OUTPATIENT_CLINIC_OR_DEPARTMENT_OTHER): Payer: Self-pay

## 2022-08-28 ENCOUNTER — Other Ambulatory Visit: Payer: Self-pay

## 2022-08-28 VITALS — BP 120/72 | HR 72 | Temp 97.6°F | Ht 65.95 in | Wt 281.2 lb

## 2022-08-28 DIAGNOSIS — J302 Other seasonal allergic rhinitis: Secondary | ICD-10-CM

## 2022-08-28 DIAGNOSIS — J4540 Moderate persistent asthma, uncomplicated: Secondary | ICD-10-CM | POA: Diagnosis not present

## 2022-08-28 DIAGNOSIS — J3089 Other allergic rhinitis: Secondary | ICD-10-CM

## 2022-08-28 MED ORDER — AZELASTINE HCL 0.1 % NA SOLN
NASAL | 11 refills | Status: AC
  Filled 2022-08-28: qty 30, 50d supply, fill #0
  Filled 2022-09-20: qty 30, 25d supply, fill #0

## 2022-08-28 MED ORDER — LEVOCETIRIZINE DIHYDROCHLORIDE 5 MG PO TABS
5.0000 mg | ORAL_TABLET | Freq: Two times a day (BID) | ORAL | 4 refills | Status: AC | PRN
  Filled 2022-08-28 – 2022-09-20 (×2): qty 180, 90d supply, fill #0

## 2022-08-28 MED ORDER — FLUTICASONE PROPIONATE 50 MCG/ACT NA SUSP
1.0000 | Freq: Every day | NASAL | 11 refills | Status: AC
  Filled 2022-08-28: qty 16, 30d supply, fill #0
  Filled 2022-09-20: qty 16, 60d supply, fill #0

## 2022-08-28 MED ORDER — ALBUTEROL SULFATE HFA 108 (90 BASE) MCG/ACT IN AERS
2.0000 | INHALATION_SPRAY | RESPIRATORY_TRACT | 1 refills | Status: AC | PRN
  Filled 2022-08-28 – 2022-09-20 (×2): qty 6.7, 17d supply, fill #0

## 2022-08-28 NOTE — Patient Instructions (Addendum)
1. Moderate persistent asthma, uncomplicated - Daily controller medication(s): None - Prior to physical activity: albuterol 2 puffs 10-15 minutes before physical activity. - Rescue medications: Albuterol 2 every 6 hours as needed for wheezing, shortness of breath. - Asthma control goals:  * Full participation in all desired activities (may need albuterol before activity) * Albuterol use two time or less a week on average (not counting use with activity) * Cough interfering with sleep two time or less a month * Oral steroids no more than once a year * No hospitalizations - Follow up with sleep doctors regarding sleep apnea machine.   2. Allergic Rhinitis (dust mite, cockroach, grasses, trees, and ragweed) - Use nasal saline rinses before nose sprays such as with Neilmed Sinus Rinse.  Use distilled water.   - Use Flonase 1 sprays each nostril daily. Aim upward and outward. - Use Azelastine 1-2 sprays each nostril twice daily as needed for runny nose, drainage, sneezing, congestion. Aim upward and outward. - Use Xyzal 5 mg daily as needed for runny nose, sneezing, itchy watery eyes.   - Consider allergy shots as long term control of your symptoms by teaching your immune system to be more tolerant of your allergy triggers  3. Return in about 1 year (around 08/28/2023).

## 2022-08-28 NOTE — Addendum Note (Signed)
Addended by: Elsworth Soho on: 08/28/2022 05:11 PM   Modules accepted: Orders

## 2022-08-28 NOTE — Progress Notes (Signed)
FOLLOW UP Date of Service/Encounter:  08/28/22   Subjective:  Molly [name] (DOB: [birth year]) is a 42 y.o. female who [facility] for follow up for moderate persistent asthma and allergic rhinitis.  History obtained from: chart review and patient. Last visit was with Dr. Dellis Anes on February 28, 2021 and at that time was doing really well on as needed albuterol.  For her allergies she was on Flonase, azelastine and Xyzal as needed.  Asthma: Asthma Control Test: ACT Total Score: 21.    Doing well overall.  She does note having some dyspnea with activity, hot weather and exposure to her allergens.  Denies any ER visits, urgent care visits or oral prednisone since last visit.  No nighttime symptoms.  Using albuterol about once a week or less. She does have sleep apnea and has had some issues with her CPAP machine recently not blowing air.  She is going to follow-up with her sleep doctor.  Rhinoconjunctivitis: Doing well overall.  Does have some intermittent congestion, drainage.  Using Flonase regularly and add some azelastine during spring and summertime.  Rarely needs the Xyzal.  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 Physical Exam: GEN: alert, well developed HEENT: clear conjunctiva, TM grey and translucent, nose with mild inferior turbinate hypertrophy, pink nasal mucosa, no rhinorrhea, no cobblestoning HEART: regular rate and rhythm, no murmur LUNGS: clear to auscultation bilaterally, no coughing,  unlabored respiration SKIN: no rashes or lesions  Spirometry:  Tracings reviewed. Her effort: Good reproducible efforts. FVC: 3.43L FEV1: 2.90L, 94% predicted FEV1/FVC ratio: 85% Interpretation: Spirometry consistent with normal pattern.  Please see scanned spirometry results for details.   Assessment:   1. Moderate persistent asthma, uncomplicated   2. Seasonal and perennial allergic rhinitis     Plan/Recommendations:   Moderate persistent asthma, uncomplicated - Well controlled, normal spirometry today.  - Daily controller medication(s): None - Prior to physical activity: albuterol 2 puffs 10-15 minutes before physical activity. - Rescue medications: Albuterol 2 every 6 hours as needed for wheezing, shortness of breath. - Asthma control goals:  * Full participation in all desired activities (may need albuterol before activity) * Albuterol use two time or less a week on average (not counting use with activity) * Cough interfering with sleep two time or less a month * Oral steroids no more than once a year * No hospitalizations - Follow up with sleep doctors regarding sleep apnea machine.   2. Allergic Rhinitis (dust mite, cockroach, grasses, trees, and ragweed) - well controlled  - Use nasal saline rinses before nose sprays such as with Neilmed Sinus Rinse.  Use distilled water.   - Use Flonase 1 sprays each nostril daily. Aim upward and outward. - Use Azelastine 1-2 sprays each nostril twice daily as needed for runny nose, drainage, sneezing, congestion. Aim upward and outward. - Use Xyzal 5 mg daily as needed for runny nose,  sneezing, itchy watery eyes.   - Consider allergy shots as long term control of your symptoms by teaching your immune system to be more tolerant of your allergy triggers   Return in about 1 year (around 08/28/2023).  Alesia Morin, MD [facility]

## 2022-08-29 ENCOUNTER — Encounter: Payer: Self-pay | Admitting: Family Medicine

## 2022-09-03 ENCOUNTER — Ambulatory Visit (INDEPENDENT_AMBULATORY_CARE_PROVIDER_SITE_OTHER): Payer: Commercial Managed Care - PPO | Admitting: Family

## 2022-09-03 ENCOUNTER — Encounter: Payer: Self-pay | Admitting: Family

## 2022-09-03 VITALS — BP 128/84 | HR 71 | Temp 97.9°F | Ht 65.95 in | Wt 284.6 lb

## 2022-09-03 DIAGNOSIS — M545 Low back pain, unspecified: Secondary | ICD-10-CM | POA: Diagnosis not present

## 2022-09-03 DIAGNOSIS — M546 Pain in thoracic spine: Secondary | ICD-10-CM | POA: Diagnosis not present

## 2022-09-03 LAB — URINALYSIS, COMPLETE
Bilirubin, UA: NEGATIVE
Glucose, UA: NEGATIVE
Ketones, UA: NEGATIVE
Leukocytes,UA: NEGATIVE
Nitrite, UA: NEGATIVE
Protein,UA: NEGATIVE
RBC, UA: NEGATIVE
Specific Gravity, UA: 1.02 (ref 1.005–1.030)
Urobilinogen, Ur: 0.2 mg/dL (ref 0.2–1.0)
pH, UA: 7 (ref 5.0–7.5)

## 2022-09-03 LAB — MICROSCOPIC EXAMINATION
Epithelial Cells (non renal): NONE SEEN /hpf (ref 0–10)
RBC, Urine: NONE SEEN /hpf (ref 0–2)
Renal Epithel, UA: NONE SEEN /hpf
Yeast, UA: NONE SEEN

## 2022-09-03 MED ORDER — DICLOFENAC SODIUM 75 MG PO TBEC: 75.0000 mg | DELAYED_RELEASE_TABLET | Freq: Two times a day (BID) | ORAL | 1 refills | Status: AC

## 2022-09-03 MED ORDER — BACLOFEN 10 MG PO TABS: 10.0000 mg | ORAL_TABLET | Freq: Three times a day (TID) | ORAL | 0 refills | Status: AC

## 2022-09-03 NOTE — Progress Notes (Signed)
   Subjective:    Patient ID: Molly [name], female    DOB: 1980-07-01, 42 y.o.   MRN: [id number]  Chief Complaint  Patient presents with   Back Pain    On both sides noticed Saturday   PT presents to the office today with bilateral back pain that is worse on left.  Back Pain This is a new problem. The current episode started in the past 7 days. The problem occurs intermittently. The pain is present in the thoracic spine. The quality of the pain is described as aching. The pain is at a severity of 3/10. The pain is moderate. Risk factors include obesity. She has tried heat for the symptoms. The treatment provided mild relief.      Review of Systems  Musculoskeletal:  Positive for back pain.  All other systems reviewed and are negative.      Objective:   Physical Exam Vitals reviewed.  Constitutional:      General: She is not in acute distress.    Appearance: She is well-developed.  HENT:     Head: Normocephalic and atraumatic.  Eyes:     Pupils: Pupils are equal, round, and reactive to light.  Neck:     Thyroid: No thyromegaly.  Cardiovascular:     Rate and Rhythm: Normal rate and regular rhythm.     Heart sounds: Normal heart sounds. No murmur heard. Pulmonary:     Effort: Pulmonary effort is normal. No respiratory distress.     Breath sounds: Normal breath sounds. No wheezing.  Abdominal:     General: Bowel sounds are normal. There is no distension.     Palpations: Abdomen is soft.     Tenderness: There is no abdominal tenderness. There is no right CVA tenderness or left CVA tenderness.  Musculoskeletal:        General: No tenderness. Normal range of motion.     Cervical back: Normal range of motion and neck supple.     Comments: Full ROM, mild thoracic pain with flexion  Skin:    General: Skin is warm and dry.  Neurological:     Mental Status: She is alert and oriented to person, place, and time.     Cranial Nerves: No cranial nerve deficit.     Deep Tendon  Reflexes: Reflexes are normal and symmetric.  Psychiatric:        Behavior: Behavior normal.        Thought Content: Thought content normal.        Judgment: Judgment normal.     BP 128/84   Pulse 71   Temp 97.9 F (36.6 C) (Temporal)   Ht 5' 5.95" (1.675 m)   Wt 284 lb 9.6 oz (129.1 kg)   SpO2 98%   BMI 46.01 kg/m        Assessment & Plan:  Molly [name] comes in today with chief complaint of Back Pain (On both sides noticed Saturday)   Diagnosis and orders addressed:  1. Acute left-sided thoracic back pain Rest Start diclofenac BID with food Urine negative More than likely muscular Baclofen as needed Sedation precautions   - Urinalysis, Complete - Urine Culture - baclofen (LIORESAL) 10 MG tablet; Take 1 tablet (10 mg total) by mouth 3 (three) times daily.  Dispense: 30 each; Refill: 0 - diclofenac (VOLTAREN) 75 MG EC tablet; Take 1 tablet (75 mg total) by mouth 2 (two) times daily.  Dispense: 60 tablet; Refill: 1     Jannifer Rodney, FNP

## 2022-09-03 NOTE — Patient Instructions (Signed)
Acute Back Pain, Adult Acute back pain is sudden and usually short-lived. It is often caused by an injury to the muscles and tissues in the back. The injury may result from: A muscle, tendon, or ligament getting overstretched or torn. Ligaments are tissues that connect bones to each other. Lifting something improperly can cause a back strain. Wear and tear (degeneration) of the spinal disks. Spinal disks are circular tissue that provide cushioning between the bones of the spine (vertebrae). Twisting motions, such as while playing sports or doing yard work. A hit to the back. Arthritis. You may have a physical exam, lab tests, and imaging tests to find the cause of your pain. Acute back pain usually goes away with rest and home care. Follow these instructions at home: Managing pain, stiffness, and swelling Take over-the-counter and prescription medicines only as told by your health care provider. Treatment may include medicines for pain and inflammation that are taken by mouth or applied to the skin, or muscle relaxants. Your health care provider may recommend applying ice during the first 24-48 hours after your pain starts. To do this: Put ice in a plastic bag. Place a towel between your skin and the bag. Leave the ice on for 20 minutes, 2-3 times a day. Remove the ice if your skin turns bright red. This is very important. If you cannot feel pain, heat, or cold, you have a greater risk of damage to the area. If directed, apply heat to the affected area as often as told by your health care provider. Use the heat source that your health care provider recommends, such as a moist heat pack or a heating pad. Place a towel between your skin and the heat source. Leave the heat on for 20-30 minutes. Remove the heat if your skin turns bright red. This is especially important if you are unable to feel pain, heat, or cold. You have a greater risk of getting burned. Activity  Do not stay in bed. Staying in  bed for more than 1-2 days can delay your recovery. Sit up and stand up straight. Avoid leaning forward when you sit or hunching over when you stand. If you work at a desk, sit close to it so you do not need to lean over. Keep your chin tucked in. Keep your neck drawn back, and keep your elbows bent at a 90-degree angle (right angle). Sit high and close to the steering wheel when you drive. Add lower back (lumbar) support to your car seat, if needed. Take short walks on even surfaces as soon as you are able. Try to increase the length of time you walk each day. Do not sit, drive, or stand in one place for more than 30 minutes at a time. Sitting or standing for long periods of time can put stress on your back. Do not drive or use heavy machinery while taking prescription pain medicine. Use proper lifting techniques. When you bend and lift, use positions that put less stress on your back: Bend your knees. Keep the load close to your body. Avoid twisting. Exercise regularly as told by your health care provider. Exercising helps your back heal faster and helps prevent back injuries by keeping muscles strong and flexible. Work with a physical therapist to make a safe exercise program, as recommended by your health care provider. Do any exercises as told by your physical therapist. Lifestyle Maintain a healthy weight. Extra weight puts stress on your back and makes it difficult to have good   posture. Avoid activities or situations that make you feel anxious or stressed. Stress and anxiety increase muscle tension and can make back pain worse. Learn ways to manage anxiety and stress, such as through exercise. General instructions Sleep on a firm mattress in a comfortable position. Try lying on your side with your knees slightly bent. If you lie on your back, put a pillow under your knees. Keep your head and neck in a straight line with your spine (neutral position) when using electronic equipment like  smartphones or pads. To do this: Raise your smartphone or pad to look at it instead of bending your head or neck to look down. Put the smartphone or pad at the level of your face while looking at the screen. Follow your treatment plan as told by your health care provider. This may include: Cognitive or behavioral therapy. Acupuncture or massage therapy. Meditation or yoga. Contact a health care provider if: You have pain that is not relieved with rest or medicine. You have increasing pain going down into your legs or buttocks. Your pain does not improve after 2 weeks. You have pain at night. You lose weight without trying. You have a fever or chills. You develop nausea or vomiting. You develop abdominal pain. Get help right away if: You develop new bowel or bladder control problems. You have unusual weakness or numbness in your arms or legs. You feel faint. These symptoms may represent a serious problem that is an emergency. Do not wait to see if the symptoms will go away. Get medical help right away. Call your local emergency services (911 in the [place] Do not drive yourself to the hospital. Summary Acute back pain is sudden and usually short-lived. Use proper lifting techniques. When you bend and lift, use positions that put less stress on your back. Take over-the-counter and prescription medicines only as told by your health care provider, and apply heat or ice as told. This information is not intended to replace advice given to you by your health care provider. Make sure you discuss any questions you have with your health care provider. Document Revised: 03/31/2020 Document Reviewed: 03/31/2020 Elsevier Patient Education  2024 [employer]  

## 2022-09-10 ENCOUNTER — Other Ambulatory Visit (HOSPITAL_BASED_OUTPATIENT_CLINIC_OR_DEPARTMENT_OTHER): Payer: Self-pay

## 2022-09-17 ENCOUNTER — Encounter: Payer: Self-pay | Admitting: Family Medicine

## 2022-09-17 ENCOUNTER — Ambulatory Visit: Payer: Commercial Managed Care - PPO | Admitting: Family Medicine

## 2022-09-17 VITALS — BP 124/84 | HR 86 | Temp 97.4°F | Ht 65.95 in | Wt 279.2 lb

## 2022-09-17 DIAGNOSIS — Z1329 Encounter for screening for other suspected endocrine disorder: Secondary | ICD-10-CM

## 2022-09-17 DIAGNOSIS — Z131 Encounter for screening for diabetes mellitus: Secondary | ICD-10-CM

## 2022-09-17 DIAGNOSIS — R5381 Other malaise: Secondary | ICD-10-CM

## 2022-09-17 DIAGNOSIS — Z0001 Encounter for general adult medical examination with abnormal findings: Secondary | ICD-10-CM | POA: Diagnosis not present

## 2022-09-17 DIAGNOSIS — R5383 Other fatigue: Secondary | ICD-10-CM | POA: Diagnosis not present

## 2022-09-17 DIAGNOSIS — Z0000 Encounter for general adult medical examination without abnormal findings: Secondary | ICD-10-CM | POA: Diagnosis not present

## 2022-09-17 DIAGNOSIS — R232 Flushing: Secondary | ICD-10-CM | POA: Diagnosis not present

## 2022-09-17 DIAGNOSIS — Z1231 Encounter for screening mammogram for malignant neoplasm of breast: Secondary | ICD-10-CM

## 2022-09-17 DIAGNOSIS — Z130 Encounter for screening for diseases of the blood and blood-forming organs and certain disorders involving the immune mechanism: Secondary | ICD-10-CM

## 2022-09-17 DIAGNOSIS — E538 Deficiency of other specified B group vitamins: Secondary | ICD-10-CM

## 2022-09-17 DIAGNOSIS — Z13220 Encounter for screening for lipoid disorders: Secondary | ICD-10-CM

## 2022-09-17 DIAGNOSIS — K582 Mixed irritable bowel syndrome: Secondary | ICD-10-CM | POA: Diagnosis not present

## 2022-09-17 LAB — BAYER DCA HB A1C WAIVED: HB A1C (BAYER DCA - WAIVED): 5.5 % (ref 4.8–5.6)

## 2022-09-17 MED ORDER — LINACLOTIDE 72 MCG PO CAPS: 72.0000 ug | ORAL_CAPSULE | Freq: Every day | ORAL | 6 refills | Status: AC

## 2022-09-17 NOTE — Progress Notes (Signed)
Complete physical exam  Patient: Molly [name]   DOB: 08-Jul-1980   42 y.o. Female  MRN: [id number]  Subjective:    Chief Complaint  Patient presents with   Establish Care   Annual Exam    Molly [name] is a 42 y.o. female who presents today for a complete physical exam. She reports consuming a general diet. The patient does not participate in regular exercise at present. She generally feels well. She reports sleeping well. She does have additional problems to discuss today. Said over the last several months she has been experiencing malaise, fatigue, hot flashes, and mood swings. She has also been experiencing diarrhea and constipation with abdominal discomfort and nausea. This has been ongoing for years. Takes Bentyl as needed but this is not significantly beneficial. Has had EGD in the past but has not had a colonoscopy. Has not had mammogram. PAP is up to date.    Most recent fall risk assessment:    09/17/2022    9:17 AM  Fall Risk   Falls in the past year? 0     Most recent depression screenings:    09/17/2022    9:17 AM 09/27/2021    9:05 AM  PHQ 2/9 Scores  PHQ - 2 Score 0 0  PHQ- 9 Score 7 1    Vision:Within last year and Dental: No current dental problems and Receives regular dental care  Patient Active Problem List   Diagnosis Date Noted   Irritable bowel syndrome with both constipation and diarrhea 09/17/2022   Alternating constipation and diarrhea 10/29/2021   Grade III hemorrhoids 10/23/2021   History of colitis 02/25/2019   GERD (gastroesophageal reflux disease) 11/16/2018   Severe obstructive sleep apnea-hypopnea syndrome 09/22/2018   Chronic intermittent hypoxia with obstructive sleep apnea 09/22/2018   Class 3 severe obesity without serious comorbidity with body mass index (BMI) of 45.0 to 49.9 in adult (HCC) 02/02/2018   Moderate persistent asthma, uncomplicated 12/31/2017   Non-seasonal allergic rhinitis due to pollen 12/31/2017   Generalized anxiety  disorder 06/17/2017   Past Medical History:  Diagnosis Date   Anemia    Colitis    Non-specific rectosigmoid colitis on colonoscopy 2004   Hypertension    Miscarriage    Moderate persistent asthma, uncomplicated 12/31/2017   Recurrent upper respiratory infection (URI)    Sleep apnea    Past Surgical History:  Procedure Laterality Date   CESAREAN SECTION     COLONOSCOPY  07/15/2002   [facility]; normal exam except for short segment of the distal sigmoid and rectosigmoid from 15 cm to 25 cm.  Biopsies taken from this area as well as transverse and descending colon.  Pathology of a sending in descending colon benign.  Mild interstitial congestion and edema in sigmoid colon.  Focal acute and chronic colitis, nonspecific in the distal sigmoid and rectum.    Social History   Tobacco Use   Smoking status: Former    Current packs/day: 0.00    Types: Cigarettes    Quit date: 02/15/2011    Years since quitting: 11.5   Smokeless tobacco: Never  Vaping Use   Vaping status: Never Used  Substance Use Topics   Alcohol use: Yes    Alcohol/week: 1.0 standard drink of alcohol    Types: 1 Cans of beer per week    Comment: occasional   Drug use: No   Social History   Socioeconomic History   Marital status: Married    Spouse name: Not on  file   Number of children: Not on file   Years of education: Not on file   Highest education level: Not on file  Occupational History   Not on file  Tobacco Use   Smoking status: Former    Current packs/day: 0.00    Types: Cigarettes    Quit date: 02/15/2011    Years since quitting: 11.5   Smokeless tobacco: Never  Vaping Use   Vaping status: Never Used  Substance and Sexual Activity   Alcohol use: Yes    Alcohol/week: 1.0 standard drink of alcohol    Types: 1 Cans of beer per week    Comment: occasional   Drug use: No   Sexual activity: Yes    Birth control/protection: None    Comment: married 6 years, trying to get pregnant   Other Topics Concern   Not on file  Social History Narrative   Not on file   Social Determinants of Health   Financial Resource Strain: Not on file  Food Insecurity: Not on file  Transportation Needs: Not on file  Physical Activity: Not on file  Stress: Not on file  Social Connections: Not on file  Intimate Partner Violence: Not on file   Family Status  Relation Name Status   Mother  Alive   Father  Alive   MGM  Alive   MGF  Deceased   PGM  Alive   PGF  Alive   Other Maternal Great Uncle Alive   Neg Hx  (Not Specified)  No partnership data on file   Family History  Problem Relation Age of Onset   Diabetes Mother    Hypertension Mother    Hyperlipidemia Mother    Allergic rhinitis Mother    Hypertension Father    Sleep apnea Father    Diabetes Maternal Grandmother    Heart disease Maternal Grandmother    Cancer Maternal Grandmother        sarcoma in arm   Kidney disease Maternal Grandmother    Allergic rhinitis Maternal Grandmother    Heart disease Maternal Grandfather        congestive heart failure   Kidney disease Maternal Grandfather    Cancer Maternal Grandfather        bladder   Hypertension Maternal Grandfather    Heart disease Paternal Grandmother    Heart disease Paternal Grandfather    COPD Paternal Grandfather    Mental illness Paternal Grandfather        PTSD   Colon cancer Other 47   Asthma Neg Hx    Eczema Neg Hx    Urticaria Neg Hx    Allergies  Allergen Reactions   Other Anaphylaxis    Reaction to hay (something in [place] pig's cage), dog and mice   Dust Mite Extract    Lavender Oil Hives   Mold Extract [Trichophyton]       Patient Care Team: Sonny Masters, FNP as PCP - General (Family Medicine) Jena Gauss, Gerrit Friends, MD as Consulting Physician (Gastroenterology)   Outpatient Medications Prior to Visit  Medication Sig   albuterol (VENTOLIN HFA) 108 (90 Base) MCG/ACT inhaler Inhale 2 puffs by mouth into the lungs every 4 (four) hours  as needed.   azelastine (ASTELIN) 0.1 % nasal spray Apply 1-2 sprays each nostril twice daily as needed for runny nose, drainage, sneezing, congestion   baclofen (LIORESAL) 10 MG tablet Take 1 tablet (10 mg total) by mouth 3 (three) times daily.   diclofenac (VOLTAREN) 75  MG EC tablet Take 1 tablet (75 mg total) by mouth 2 (two) times daily.   dicyclomine (BENTYL) 10 MG capsule Take 1 capsule (10 mg total) by mouth 3 (three) times daily as needed (for abdominal cramping or loose stools. Hold in setting of constipation).   fluticasone (FLONASE) 50 MCG/ACT nasal spray Place 1 spray into both nostrils daily.   levocetirizine (XYZAL) 5 MG tablet Take 1 tablet (5 mg total) by mouth 2 (two) times daily as needed for allergies (Can take an extra dose during flare ups.).   Multiple Vitamins-Minerals (MULTIVITAMIN WITH MINERALS) tablet Take 1 tablet by mouth daily.   [DISCONTINUED] lidocaine (XYLOCAINE) 5 % ointment Apply 1 Application topically as needed. (Patient not taking: Reported on 08/28/2022)   No facility-administered medications prior to visit.    Review of Systems  Constitutional:  Positive for diaphoresis and malaise/fatigue. Negative for chills, fever and weight loss.  HENT: Negative.    Eyes:  Negative for blurred vision and double vision.  Respiratory: Negative.    Cardiovascular:  Negative for chest pain, palpitations, orthopnea, claudication, leg swelling and PND.  Gastrointestinal:  Positive for abdominal pain, constipation, diarrhea, nausea and vomiting. Negative for blood in stool, heartburn and melena.  Genitourinary: Negative.   Musculoskeletal: Negative.   Skin: Negative.   Neurological: Negative.   Endo/Heme/Allergies: Negative.   Psychiatric/Behavioral:  Negative for depression, hallucinations, memory loss, substance abuse and suicidal ideas. The patient is not nervous/anxious and does not have insomnia.        Mood swings  All other systems reviewed and are  negative.         Objective:     BP 124/84   Pulse 86   Temp (!) 97.4 F (36.3 C) (Temporal)   Ht 5' 5.95" (1.675 m)   Wt 279 lb 3.2 oz (126.6 kg)   LMP 09/09/2022 (Exact Date)   SpO2 95%   BMI 45.13 kg/m  BP Readings from Last 3 Encounters:  09/17/22 124/84  09/03/22 128/84  08/28/22 120/72   Wt Readings from Last 3 Encounters:  09/17/22 279 lb 3.2 oz (126.6 kg)  09/03/22 284 lb 9.6 oz (129.1 kg)  08/28/22 281 lb 3.2 oz (127.6 kg)   SpO2 Readings from Last 3 Encounters:  09/17/22 95%  09/03/22 98%  08/28/22 97%      Physical Exam Vitals and nursing note reviewed.  Constitutional:      General: She is not in acute distress.    Appearance: Normal appearance. She is well-developed and well-groomed. She is morbidly obese. She is not ill-appearing, toxic-appearing or diaphoretic.  HENT:     Head: Normocephalic and atraumatic.     Jaw: There is normal jaw occlusion.     Right Ear: Hearing, tympanic membrane, ear canal and external ear normal.     Left Ear: Hearing, tympanic membrane, ear canal and external ear normal.     Nose: Nose normal.     Mouth/Throat:     Lips: Pink.     Mouth: Mucous membranes are moist.     Pharynx: Oropharynx is clear. Uvula midline.  Eyes:     General: Lids are normal.     Extraocular Movements: Extraocular movements intact.     Conjunctiva/sclera: Conjunctivae normal.     Pupils: Pupils are equal, round, and reactive to light.  Neck:     Thyroid: No thyroid mass, thyromegaly or thyroid tenderness.     Vascular: No carotid bruit or JVD.     Trachea: Trachea  and phonation normal.  Cardiovascular:     Rate and Rhythm: Normal rate and regular rhythm.     Chest Wall: PMI is not displaced.     Pulses: Normal pulses.     Heart sounds: Normal heart sounds. No murmur heard.    No friction rub. No gallop.  Pulmonary:     Effort: Pulmonary effort is normal. No respiratory distress.     Breath sounds: Normal breath sounds. No wheezing.   Abdominal:     General: Bowel sounds are normal. There is no distension or abdominal bruit.     Palpations: Abdomen is soft. There is no hepatomegaly or splenomegaly.     Tenderness: There is no abdominal tenderness. There is no right CVA tenderness or left CVA tenderness.     Hernia: No hernia is present.  Genitourinary:    Comments: Deferred Musculoskeletal:        General: Normal range of motion.     Cervical back: Normal range of motion and neck supple.     Right lower leg: No edema.     Left lower leg: No edema.  Lymphadenopathy:     Cervical: No cervical adenopathy.  Skin:    General: Skin is warm and dry.     Capillary Refill: Capillary refill takes less than 2 seconds.     Coloration: Skin is not cyanotic, jaundiced or pale.     Findings: No rash.  Neurological:     General: No focal deficit present.     Mental Status: She is alert and oriented to person, place, and time.     Sensory: Sensation is intact.     Motor: Motor function is intact.     Coordination: Coordination is intact.     Gait: Gait is intact.     Deep Tendon Reflexes: Reflexes are normal and symmetric.  Psychiatric:        Attention and Perception: Attention and perception normal.        Mood and Affect: Mood and affect normal.        Speech: Speech normal.        Behavior: Behavior normal. Behavior is cooperative.        Thought Content: Thought content normal.        Cognition and Memory: Cognition and memory normal.        Judgment: Judgment normal.      Last CBC Lab Results  Component Value Date   WBC 12.1 (H) 09/13/2020   HGB 12.8 09/13/2020   HCT 38.5 09/13/2020   MCV 80 09/13/2020   MCH 26.4 (L) 09/13/2020   RDW 13.6 09/13/2020   PLT 433 09/13/2020   Last metabolic panel Lab Results  Component Value Date   GLUCOSE 96 03/08/2020   NA 141 03/08/2020   K 4.4 03/08/2020   CL 102 03/08/2020   CO2 23 03/08/2020   BUN 8 03/08/2020   CREATININE 0.65 03/08/2020   GFRNONAA 113  03/08/2020   CALCIUM 9.7 03/08/2020   PROT 7.5 03/08/2020   ALBUMIN 4.4 03/08/2020   LABGLOB 3.1 03/08/2020   AGRATIO 1.4 03/08/2020   BILITOT 0.3 03/08/2020   ALKPHOS 99 03/08/2020   AST 24 03/08/2020   ALT 19 03/08/2020   ANIONGAP 7 01/02/2019   Last lipids Lab Results  Component Value Date   CHOL 144 03/08/2020   HDL 45 03/08/2020   LDLCALC 77 03/08/2020   TRIG 120 03/08/2020   CHOLHDL 3.2 03/08/2020   Last hemoglobin A1c Lab Results  Component Value Date   HGBA1C 5.7 (H) 03/15/2019   Last thyroid functions Lab Results  Component Value Date   TSH 2.870 09/13/2020   T4TOTAL 6.8 09/13/2020   Last vitamin D Lab Results  Component Value Date   25OHVITD2 <1.0 03/15/2019   25OHVITD3 17 03/15/2019   Last vitamin B12 and Folate Lab Results  Component Value Date   VITAMINB12 123 (L) 03/15/2019        Assessment & Plan:    Routine Health Maintenance and Physical Exam  Immunization History  Administered Date(s) Administered   Influenza Inj Mdck Quad Pf 12/03/2017   Influenza,inj,Quad PF,6+ Mos 11/16/2018, 10/18/2020, 11/09/2021   Influenza-Unspecified 11/16/2018, 11/19/2019   Tdap 04/30/2017    Health Maintenance  Topic Date Due   INFLUENZA VACCINE  04/21/2023 (Originally 08/22/2022)   Hepatitis C Screening  09/17/2023 (Originally 01/05/2000)   HIV Screening  09/17/2023 (Originally 01/04/1997)   PAP SMEAR-Modifier  03/09/2023   DTaP/Tdap/Td (2 - Td or Tdap) 05/01/2027   HPV VACCINES  Aged Out   COVID-19 Vaccine  Discontinued    Discussed health benefits of physical activity, and encouraged her to engage in regular exercise appropriate for her age and condition.  Problem List Items Addressed This Visit       Digestive   Irritable bowel syndrome with both constipation and diarrhea   Samples of Linzess provided in office. Aware to take every morning before breakfast. If beneficial, pick up from pharmacy.    linaclotide (LINZESS) 72 MCG capsule   Other  Visit Diagnoses     Annual physical exam    -  Primary   Relevant Orders   Anemia Profile B   CMP14+EGFR   Hormone Panel   Lipid panel   Bayer DCA Hb A1c Waived   MM 3D SCREENING MAMMOGRAM BILATERAL BREAST   Encounter for screening mammogram for malignant neoplasm of breast       Relevant Orders   MM 3D SCREENING MAMMOGRAM BILATERAL BREAST   Screening for diabetes mellitus       Relevant Orders   CMP14+EGFR   Bayer DCA Hb A1c Waived   Screening for thyroid disorder       Relevant Orders   Hormone Panel   Screening for deficiency anemia       Relevant Orders   Anemia Profile B   Screening for lipid disorders       Relevant Orders   Lipid panel   Malaise and fatigue       Will check below for potential underlying causes.    Anemia Profile B   CMP14+EGFR   Hormone Panel   Hot flashes       Will check below for potential underlying causes.    Anemia Profile B   CMP14+EGFR   Hormone Panel      Return in about 1 year (around 09/17/2023) for CPE.    The above assessment and management plan was discussed with the patient. The patient verbalized understanding of and has agreed to the management plan. Patient is aware to call the clinic if they develop any new symptoms or if symptoms fail to improve or worsen. Patient is aware when to return to the clinic for a follow-up visit. Patient educated on when it is appropriate to go to the emergency department.   Kari Baars, FNP-C [facility] [address] [place], [place] [address] [phone]

## 2022-09-20 ENCOUNTER — Other Ambulatory Visit: Payer: Self-pay

## 2022-09-24 ENCOUNTER — Other Ambulatory Visit: Payer: Self-pay

## 2022-10-01 LAB — CMP14+EGFR
ALT: 16 IU/L (ref 0–32)
AST: 18 IU/L (ref 0–40)
Albumin: 4.4 g/dL (ref 3.9–4.9)
Alkaline Phosphatase: 92 IU/L (ref 44–121)
BUN/Creatinine Ratio: 10 (ref 9–23)
BUN: 8 mg/dL (ref 6–24)
Bilirubin Total: 0.4 mg/dL (ref 0.0–1.2)
CO2: 23 mmol/L (ref 20–29)
Calcium: 10.1 mg/dL (ref 8.7–10.2)
Chloride: 103 mmol/L (ref 96–106)
Creatinine, Ser: 0.84 mg/dL (ref 0.57–1.00)
Globulin, Total: 2.9 g/dL (ref 1.5–4.5)
Glucose: 95 mg/dL (ref 70–99)
Potassium: 4.5 mmol/L (ref 3.5–5.2)
Sodium: 139 mmol/L (ref 134–144)
Total Protein: 7.3 g/dL (ref 6.0–8.5)
eGFR: 90 mL/min/{1.73_m2} (ref >59)

## 2022-10-01 LAB — LIPID PANEL
Chol/HDL Ratio: 3.4 ratio (ref 0.0–4.4)
Cholesterol, Total: 141 mg/dL (ref 100–199)
HDL: 41 mg/dL (ref >39)
LDL Chol Calc (NIH): 76 mg/dL (ref 0–99)
Triglycerides: 134 mg/dL (ref 0–149)
VLDL Cholesterol Cal: 24 mg/dL (ref 5–40)

## 2022-10-01 LAB — ANEMIA PROFILE B
Basophils Absolute: 0.1 10*3/uL (ref 0.0–0.2)
Basos: 1 %
EOS (ABSOLUTE): 0.4 10*3/uL (ref 0.0–0.4)
Eos: 4 %
Ferritin: 52 ng/mL (ref 15–150)
Folate: 4.7 ng/mL (ref >3.0)
Hematocrit: 41.2 % (ref 34.0–46.6)
Hemoglobin: 14.1 g/dL (ref 11.1–15.9)
Immature Grans (Abs): 0 10*3/uL (ref 0.0–0.1)
Immature Granulocytes: 0 %
Iron Saturation: 24 % (ref 15–55)
Iron: 78 ug/dL (ref 27–159)
Lymphocytes Absolute: 2.2 10*3/uL (ref 0.7–3.1)
Lymphs: 25 %
MCH: 28.1 pg (ref 26.6–33.0)
MCHC: 34.2 g/dL (ref 31.5–35.7)
MCV: 82 fL (ref 79–97)
Monocytes Absolute: 0.5 10*3/uL (ref 0.1–0.9)
Monocytes: 5 %
Neutrophils Absolute: 5.6 10*3/uL (ref 1.4–7.0)
Neutrophils: 65 %
Platelets: 445 10*3/uL (ref 150–450)
RBC: 5.01 x10E6/uL (ref 3.77–5.28)
RDW: 14.1 % (ref 11.7–15.4)
Retic Ct Pct: 1.4 % (ref 0.6–2.6)
Total Iron Binding Capacity: 324 ug/dL (ref 250–450)
UIBC: 246 ug/dL (ref 131–425)
Vitamin B-12: 115 pg/mL — ABNORMAL LOW (ref 232–1245)
WBC: 8.7 10*3/uL (ref 3.4–10.8)

## 2022-10-01 LAB — HORMONE PANEL (T4,TSH,FSH,TESTT,SHBG,DHEA,ETC)
DHEA-Sulfate, LCMS: 74 ug/dL
Estradiol, Serum, MS: 106 pg/mL
Estrone Sulfate: 144 ng/dL
Follicle Stimulating Hormone: 4.7 m[IU]/mL
Free T-3: 3.3 pg/mL
Free Testosterone, Serum: 3 pg/mL
Progesterone, Serum: <10 ng/dL
Sex Hormone Binding Globulin: 39.4 nmol/L
T4: 6.6 ug/dL
TSH: 2.7 uU/mL
Testosterone, Serum (Total): 19 ng/dL
Testosterone-% Free: 1.6 %
Triiodothyronine (T-3), Serum: 128 ng/dL

## 2022-10-08 NOTE — Addendum Note (Signed)
Addended by: Angela Adam on: 10/08/2022 09:37 AM   Modules accepted: Orders

## 2022-10-14 DIAGNOSIS — H11822 Conjunctivochalasis, left eye: Secondary | ICD-10-CM | POA: Diagnosis not present

## 2022-10-14 DIAGNOSIS — H52223 Regular astigmatism, bilateral: Secondary | ICD-10-CM | POA: Diagnosis not present

## 2022-10-14 DIAGNOSIS — H5203 Hypermetropia, bilateral: Secondary | ICD-10-CM | POA: Diagnosis not present

## 2022-10-14 DIAGNOSIS — H5310 Unspecified subjective visual disturbances: Secondary | ICD-10-CM | POA: Diagnosis not present

## 2022-10-14 DIAGNOSIS — Q132 Other congenital malformations of iris: Secondary | ICD-10-CM | POA: Diagnosis not present

## 2022-10-23 ENCOUNTER — Telehealth (INDEPENDENT_AMBULATORY_CARE_PROVIDER_SITE_OTHER): Payer: Commercial Managed Care - PPO | Admitting: Family Medicine

## 2022-10-23 ENCOUNTER — Encounter: Payer: Self-pay | Admitting: Family Medicine

## 2022-10-23 DIAGNOSIS — B001 Herpesviral vesicular dermatitis: Secondary | ICD-10-CM

## 2022-10-23 MED ORDER — VALACYCLOVIR HCL 1 G PO TABS: 1000.0000 mg | ORAL_TABLET | Freq: Three times a day (TID) | ORAL | 0 refills | Status: DC

## 2022-10-23 NOTE — Progress Notes (Signed)
Virtual Visit via Video   I connected with patient on 10/23/22 at 0925 by a video enabled telemedicine application and verified that I am speaking with the correct person using two identifiers.  Location patient: Home Location provider: [facility] Persons participating in the virtual visit: Patient and Provider  I discussed the limitations of evaluation and management by telemedicine and the availability of in person appointments. The patient expressed understanding and agreed to proceed.  Subjective:   HPI:  Pt presents today for  Chief Complaint  Patient presents with   Mouth Lesions   Pt reports she had a blister on her top lip that she thought was a zit, states she tried to pop it and now it has spread across her top lip. No lesions to surrounding skin.   Review of Systems  Skin:  Positive for rash.  All other systems reviewed and are negative.    Patient Active Problem List   Diagnosis Date Noted   Irritable bowel syndrome with both constipation and diarrhea 09/17/2022   Alternating constipation and diarrhea 10/29/2021   Grade III hemorrhoids 10/23/2021   History of colitis 02/25/2019   GERD (gastroesophageal reflux disease) 11/16/2018   Severe obstructive sleep apnea-hypopnea syndrome 09/22/2018   Chronic intermittent hypoxia with obstructive sleep apnea 09/22/2018   Class 3 severe obesity without serious comorbidity with body mass index (BMI) of 45.0 to 49.9 in adult (HCC) 02/02/2018   Moderate persistent asthma, uncomplicated 12/31/2017   Non-seasonal allergic rhinitis due to pollen 12/31/2017   Generalized anxiety disorder 06/17/2017    Social History   Tobacco Use   Smoking status: Former    Current packs/day: 0.00    Types: Cigarettes    Quit date: 02/15/2011    Years since quitting: 11.6   Smokeless tobacco: Never  Substance Use Topics   Alcohol use: Yes    Alcohol/week: 1.0 standard drink of alcohol    Types: 1 Cans  of beer per week    Comment: occasional    Current Outpatient Medications:    valACYclovir (VALTREX) 1000 MG tablet, Take 1 tablet (1,000 mg total) by mouth 3 (three) times daily for 10 days., Disp: 90 tablet, Rfl: 0   albuterol (VENTOLIN HFA) 108 (90 Base) MCG/ACT inhaler, Inhale 2 puffs by mouth into the lungs every 4 (four) hours as needed., Disp: 6.7 g, Rfl: 1   azelastine (ASTELIN) 0.1 % nasal spray, Apply 1-2 sprays each nostril twice daily as needed for runny nose, drainage, sneezing, congestion, Disp: 30 mL, Rfl: 11   baclofen (LIORESAL) 10 MG tablet, Take 1 tablet (10 mg total) by mouth 3 (three) times daily., Disp: 30 each, Rfl: 0   diclofenac (VOLTAREN) 75 MG EC tablet, Take 1 tablet (75 mg total) by mouth 2 (two) times daily., Disp: 60 tablet, Rfl: 1   dicyclomine (BENTYL) 10 MG capsule, Take 1 capsule (10 mg total) by mouth 3 (three) times daily as needed (for abdominal cramping or loose stools. Hold in setting of constipation)., Disp: 90 capsule, Rfl: 1   fluticasone (FLONASE) 50 MCG/ACT nasal spray, Place 1 spray into both nostrils daily., Disp: 16 g, Rfl: 11   levocetirizine (XYZAL) 5 MG tablet, Take 1 tablet (5 mg total) by mouth 2 (two) times daily as needed for allergies (Can take an extra dose during flare ups.)., Disp: 180 tablet, Rfl: 4   linaclotide (LINZESS) 72 MCG capsule, Take 1 capsule (72 mcg total) by mouth daily before breakfast., Disp: 30 capsule,  Rfl: 6   Multiple Vitamins-Minerals (MULTIVITAMIN WITH MINERALS) tablet, Take 1 tablet by mouth daily., Disp: , Rfl:   Allergies  Allergen Reactions   Other Anaphylaxis    Reaction to hay (something in [place] pig's cage), dog and mice   Dust Mite Extract    Lavender Oil Hives   Mold Extract [Trichophyton]     Objective:   There were no vitals taken for this visit.  Patient is well-developed, well-nourished in no acute distress.  Resting comfortably at home.  Head is normocephalic, atraumatic.  No labored  breathing.  Speech is clear and coherent with logical content.  Patient is alert and oriented at baseline.  Blisters along vermilion border of top lip.   Assessment and Plan:   Shayann was seen today for mouth lesions.  Diagnoses and all orders for this visit:  Cold sore Symptomatic care discussed in detail. Will treat with below. Report new, worsening, or persistent symptoms. -     valACYclovir (VALTREX) 1000 MG tablet; Take 1 tablet (1,000 mg total) by mouth 3 (three) times daily for 10 days.      Return if symptoms worsen or fail to improve.  Kari Baars, FNP-C [facility] [address] [place], [place] [address] [phone]  10/23/2022  Time spent with the patient: 10 minutes, of which >50% was spent in obtaining information about symptoms, reviewing previous labs, evaluations, and treatments, counseling about condition (please see the discussed topics above), and developing a plan to further investigate it; had a number of questions which I addressed.

## 2022-10-25 ENCOUNTER — Encounter: Payer: Commercial Managed Care - PPO | Admitting: Family Medicine

## 2022-11-13 DIAGNOSIS — M9903 Segmental and somatic dysfunction of lumbar region: Secondary | ICD-10-CM | POA: Diagnosis not present

## 2022-11-13 DIAGNOSIS — M545 Low back pain, unspecified: Secondary | ICD-10-CM | POA: Diagnosis not present

## 2022-11-13 DIAGNOSIS — M9901 Segmental and somatic dysfunction of cervical region: Secondary | ICD-10-CM | POA: Diagnosis not present

## 2022-11-13 DIAGNOSIS — M542 Cervicalgia: Secondary | ICD-10-CM | POA: Diagnosis not present

## 2022-11-14 DIAGNOSIS — M546 Pain in thoracic spine: Secondary | ICD-10-CM | POA: Diagnosis not present

## 2022-11-14 DIAGNOSIS — M9903 Segmental and somatic dysfunction of lumbar region: Secondary | ICD-10-CM | POA: Diagnosis not present

## 2022-11-14 DIAGNOSIS — M545 Low back pain, unspecified: Secondary | ICD-10-CM | POA: Diagnosis not present

## 2022-11-14 DIAGNOSIS — M9902 Segmental and somatic dysfunction of thoracic region: Secondary | ICD-10-CM | POA: Diagnosis not present

## 2022-11-14 DIAGNOSIS — M9901 Segmental and somatic dysfunction of cervical region: Secondary | ICD-10-CM | POA: Diagnosis not present

## 2022-11-14 DIAGNOSIS — M542 Cervicalgia: Secondary | ICD-10-CM | POA: Diagnosis not present

## 2022-11-18 DIAGNOSIS — M9902 Segmental and somatic dysfunction of thoracic region: Secondary | ICD-10-CM | POA: Diagnosis not present

## 2022-11-18 DIAGNOSIS — M542 Cervicalgia: Secondary | ICD-10-CM | POA: Diagnosis not present

## 2022-11-18 DIAGNOSIS — M545 Low back pain, unspecified: Secondary | ICD-10-CM | POA: Diagnosis not present

## 2022-11-18 DIAGNOSIS — M9903 Segmental and somatic dysfunction of lumbar region: Secondary | ICD-10-CM | POA: Diagnosis not present

## 2022-11-18 DIAGNOSIS — M9901 Segmental and somatic dysfunction of cervical region: Secondary | ICD-10-CM | POA: Diagnosis not present

## 2022-11-18 DIAGNOSIS — M546 Pain in thoracic spine: Secondary | ICD-10-CM | POA: Diagnosis not present

## 2022-11-20 DIAGNOSIS — M546 Pain in thoracic spine: Secondary | ICD-10-CM | POA: Diagnosis not present

## 2022-11-20 DIAGNOSIS — M545 Low back pain, unspecified: Secondary | ICD-10-CM | POA: Diagnosis not present

## 2022-11-20 DIAGNOSIS — M9901 Segmental and somatic dysfunction of cervical region: Secondary | ICD-10-CM | POA: Diagnosis not present

## 2022-11-20 DIAGNOSIS — M9902 Segmental and somatic dysfunction of thoracic region: Secondary | ICD-10-CM | POA: Diagnosis not present

## 2022-11-20 DIAGNOSIS — M9903 Segmental and somatic dysfunction of lumbar region: Secondary | ICD-10-CM | POA: Diagnosis not present

## 2022-11-20 DIAGNOSIS — M542 Cervicalgia: Secondary | ICD-10-CM | POA: Diagnosis not present

## 2022-11-22 DIAGNOSIS — M546 Pain in thoracic spine: Secondary | ICD-10-CM | POA: Diagnosis not present

## 2022-11-22 DIAGNOSIS — M9903 Segmental and somatic dysfunction of lumbar region: Secondary | ICD-10-CM | POA: Diagnosis not present

## 2022-11-22 DIAGNOSIS — M9902 Segmental and somatic dysfunction of thoracic region: Secondary | ICD-10-CM | POA: Diagnosis not present

## 2022-11-22 DIAGNOSIS — M9901 Segmental and somatic dysfunction of cervical region: Secondary | ICD-10-CM | POA: Diagnosis not present

## 2022-11-22 DIAGNOSIS — M545 Low back pain, unspecified: Secondary | ICD-10-CM | POA: Diagnosis not present

## 2022-11-22 DIAGNOSIS — M542 Cervicalgia: Secondary | ICD-10-CM | POA: Diagnosis not present

## 2022-12-02 DIAGNOSIS — M546 Pain in thoracic spine: Secondary | ICD-10-CM | POA: Diagnosis not present

## 2022-12-02 DIAGNOSIS — M545 Low back pain, unspecified: Secondary | ICD-10-CM | POA: Diagnosis not present

## 2022-12-02 DIAGNOSIS — M542 Cervicalgia: Secondary | ICD-10-CM | POA: Diagnosis not present

## 2022-12-02 DIAGNOSIS — M9901 Segmental and somatic dysfunction of cervical region: Secondary | ICD-10-CM | POA: Diagnosis not present

## 2022-12-02 DIAGNOSIS — M9902 Segmental and somatic dysfunction of thoracic region: Secondary | ICD-10-CM | POA: Diagnosis not present

## 2022-12-02 DIAGNOSIS — M9903 Segmental and somatic dysfunction of lumbar region: Secondary | ICD-10-CM | POA: Diagnosis not present

## 2022-12-05 DIAGNOSIS — M9903 Segmental and somatic dysfunction of lumbar region: Secondary | ICD-10-CM | POA: Diagnosis not present

## 2022-12-05 DIAGNOSIS — M546 Pain in thoracic spine: Secondary | ICD-10-CM | POA: Diagnosis not present

## 2022-12-05 DIAGNOSIS — M545 Low back pain, unspecified: Secondary | ICD-10-CM | POA: Diagnosis not present

## 2022-12-05 DIAGNOSIS — M9902 Segmental and somatic dysfunction of thoracic region: Secondary | ICD-10-CM | POA: Diagnosis not present

## 2022-12-05 DIAGNOSIS — M9901 Segmental and somatic dysfunction of cervical region: Secondary | ICD-10-CM | POA: Diagnosis not present

## 2022-12-05 DIAGNOSIS — M542 Cervicalgia: Secondary | ICD-10-CM | POA: Diagnosis not present

## 2022-12-10 ENCOUNTER — Other Ambulatory Visit: Payer: Self-pay | Admitting: Family Medicine

## 2022-12-10 DIAGNOSIS — H5203 Hypermetropia, bilateral: Secondary | ICD-10-CM | POA: Diagnosis not present

## 2022-12-10 DIAGNOSIS — M545 Low back pain, unspecified: Secondary | ICD-10-CM | POA: Diagnosis not present

## 2022-12-10 DIAGNOSIS — M546 Pain in thoracic spine: Secondary | ICD-10-CM | POA: Diagnosis not present

## 2022-12-10 DIAGNOSIS — M9901 Segmental and somatic dysfunction of cervical region: Secondary | ICD-10-CM | POA: Diagnosis not present

## 2022-12-10 DIAGNOSIS — M9902 Segmental and somatic dysfunction of thoracic region: Secondary | ICD-10-CM | POA: Diagnosis not present

## 2022-12-10 DIAGNOSIS — B001 Herpesviral vesicular dermatitis: Secondary | ICD-10-CM

## 2022-12-10 DIAGNOSIS — M9903 Segmental and somatic dysfunction of lumbar region: Secondary | ICD-10-CM | POA: Diagnosis not present

## 2022-12-10 DIAGNOSIS — M542 Cervicalgia: Secondary | ICD-10-CM | POA: Diagnosis not present

## 2022-12-12 DIAGNOSIS — M545 Low back pain, unspecified: Secondary | ICD-10-CM | POA: Diagnosis not present

## 2022-12-12 DIAGNOSIS — M546 Pain in thoracic spine: Secondary | ICD-10-CM | POA: Diagnosis not present

## 2022-12-12 DIAGNOSIS — M9902 Segmental and somatic dysfunction of thoracic region: Secondary | ICD-10-CM | POA: Diagnosis not present

## 2022-12-12 DIAGNOSIS — M9901 Segmental and somatic dysfunction of cervical region: Secondary | ICD-10-CM | POA: Diagnosis not present

## 2022-12-12 DIAGNOSIS — M9903 Segmental and somatic dysfunction of lumbar region: Secondary | ICD-10-CM | POA: Diagnosis not present

## 2022-12-12 DIAGNOSIS — M542 Cervicalgia: Secondary | ICD-10-CM | POA: Diagnosis not present

## 2022-12-17 DIAGNOSIS — M9901 Segmental and somatic dysfunction of cervical region: Secondary | ICD-10-CM | POA: Diagnosis not present

## 2022-12-17 DIAGNOSIS — M9903 Segmental and somatic dysfunction of lumbar region: Secondary | ICD-10-CM | POA: Diagnosis not present

## 2022-12-17 DIAGNOSIS — M545 Low back pain, unspecified: Secondary | ICD-10-CM | POA: Diagnosis not present

## 2022-12-17 DIAGNOSIS — M9902 Segmental and somatic dysfunction of thoracic region: Secondary | ICD-10-CM | POA: Diagnosis not present

## 2022-12-17 DIAGNOSIS — M542 Cervicalgia: Secondary | ICD-10-CM | POA: Diagnosis not present

## 2022-12-17 DIAGNOSIS — M546 Pain in thoracic spine: Secondary | ICD-10-CM | POA: Diagnosis not present

## 2022-12-23 DIAGNOSIS — M545 Low back pain, unspecified: Secondary | ICD-10-CM | POA: Diagnosis not present

## 2022-12-23 DIAGNOSIS — M9903 Segmental and somatic dysfunction of lumbar region: Secondary | ICD-10-CM | POA: Diagnosis not present

## 2022-12-23 DIAGNOSIS — M546 Pain in thoracic spine: Secondary | ICD-10-CM | POA: Diagnosis not present

## 2022-12-23 DIAGNOSIS — M9901 Segmental and somatic dysfunction of cervical region: Secondary | ICD-10-CM | POA: Diagnosis not present

## 2022-12-23 DIAGNOSIS — M542 Cervicalgia: Secondary | ICD-10-CM | POA: Diagnosis not present

## 2022-12-23 DIAGNOSIS — M9902 Segmental and somatic dysfunction of thoracic region: Secondary | ICD-10-CM | POA: Diagnosis not present

## 2022-12-24 ENCOUNTER — Ambulatory Visit: Payer: Commercial Managed Care - PPO | Admitting: Family Medicine

## 2022-12-24 ENCOUNTER — Encounter: Payer: Self-pay | Admitting: Family Medicine

## 2022-12-24 VITALS — BP 126/85 | HR 83 | Temp 98.2°F | Ht 65.95 in | Wt 174.2 lb

## 2022-12-24 DIAGNOSIS — E538 Deficiency of other specified B group vitamins: Secondary | ICD-10-CM

## 2022-12-24 DIAGNOSIS — H02839 Dermatochalasis of unspecified eye, unspecified eyelid: Secondary | ICD-10-CM

## 2022-12-24 DIAGNOSIS — H5789 Other specified disorders of eye and adnexa: Secondary | ICD-10-CM

## 2022-12-24 DIAGNOSIS — L309 Dermatitis, unspecified: Secondary | ICD-10-CM | POA: Diagnosis not present

## 2022-12-24 MED ORDER — METRONIDAZOLE 0.75 % EX GEL: 1.0000 | Freq: Two times a day (BID) | CUTANEOUS | 0 refills | Status: AC

## 2022-12-24 NOTE — Progress Notes (Signed)
Subjective:  Patient ID: Molly [name], female    DOB: 1980/03/09, 42 y.o.   MRN: [id number]  Patient Care Team: Sonny Masters, FNP as PCP - General (Family Medicine) Jena Gauss Gerrit Friends, MD as Consulting Physician (Gastroenterology)   Chief Complaint:  dry skin on face (Dry skin on face, face breaking out since Friday. Looks better today)   HPI: Molly [name] is a 42 y.o. female presenting on 12/24/2022 for dry skin on face (Dry skin on face, face breaking out since Friday. Looks better today)   Discussed the use of AI scribe software for clinical note transcription with the patient, who gave verbal consent to proceed.  History of Present Illness   The patient presents with a chief complaint of facial dryness and flaking, primarily affecting the chin and nose, which they describe as feeling like 'sandpaper.' The condition began a few weeks ago, initially attributed to weather changes, but has since worsened. The patient noticed 'dandruff-like' flakes coming off their chin while at work, which was particularly concerning. They also report a persistent redness on their face, specifically where their CPAP mask lays, which has been progressively worsening.  The patient has been managing the dryness with daily cleansing and moisturizing, using products containing hyaluronic acid and retinol. However, they have had to discontinue the use of toners due to a burning sensation and resultant redness. They also report limiting exfoliation to twice a week.  In addition to the skin concerns, the patient reports a persistent issue with one eye that drains constantly. They have discussed this with their ophthalmologist, who attributed it to the curl of their eyelashes causing irritation. The patient also notes increasing droopiness of their eyelids, more pronounced on the side with the constantly draining eye.  Lastly, the patient has been taking a daily dose of 10,000 units of methylcobalamin for their  vitamin D levels, which they were due to have checked in the following week.          Relevant past medical, surgical, family, and social history reviewed and updated as indicated.  Allergies and medications reviewed and updated. Data reviewed: Chart in Epic.   Past Medical History:  Diagnosis Date   Anemia    Colitis    Non-specific rectosigmoid colitis on colonoscopy 2004   Hypertension    Miscarriage    Moderate persistent asthma, uncomplicated 12/31/2017   Recurrent upper respiratory infection (URI)    Sleep apnea     Past Surgical History:  Procedure Laterality Date   CESAREAN SECTION     COLONOSCOPY  07/15/2002   [facility]; normal exam except for short segment of the distal sigmoid and rectosigmoid from 15 cm to 25 cm.  Biopsies taken from this area as well as transverse and descending colon.  Pathology of a sending in descending colon benign.  Mild interstitial congestion and edema in sigmoid colon.  Focal acute and chronic colitis, nonspecific in the distal sigmoid and rectum.     Social History   Socioeconomic History   Marital status: Married    Spouse name: Not on file   Number of children: Not on file   Years of education: Not on file   Highest education level: Not on file  Occupational History   Not on file  Tobacco Use   Smoking status: Former    Current packs/day: 0.00    Types: Cigarettes    Quit date: 02/15/2011    Years since quitting: 11.8   Smokeless tobacco:  Never  Vaping Use   Vaping status: Never Used  Substance and Sexual Activity   Alcohol use: Yes    Alcohol/week: 1.0 standard drink of alcohol    Types: 1 Cans of beer per week    Comment: occasional   Drug use: No   Sexual activity: Yes    Birth control/protection: None    Comment: married 6 years, trying to get pregnant  Other Topics Concern   Not on file  Social History Narrative   Not on file   Social Determinants of Health   Financial Resource Strain: Not on  file  Food Insecurity: Not on file  Transportation Needs: Not on file  Physical Activity: Not on file  Stress: Not on file  Social Connections: Not on file  Intimate Partner Violence: Not on file    Outpatient Encounter Medications as of 12/24/2022  Medication Sig   albuterol (VENTOLIN HFA) 108 (90 Base) MCG/ACT inhaler Inhale 2 puffs by mouth into the lungs every 4 (four) hours as needed.   azelastine (ASTELIN) 0.1 % nasal spray Apply 1-2 sprays each nostril twice daily as needed for runny nose, drainage, sneezing, congestion   baclofen (LIORESAL) 10 MG tablet Take 1 tablet (10 mg total) by mouth 3 (three) times daily.   diclofenac (VOLTAREN) 75 MG EC tablet Take 1 tablet (75 mg total) by mouth 2 (two) times daily.   dicyclomine (BENTYL) 10 MG capsule Take 1 capsule (10 mg total) by mouth 3 (three) times daily as needed (for abdominal cramping or loose stools. Hold in setting of constipation).   fluticasone (FLONASE) 50 MCG/ACT nasal spray Place 1 spray into both nostrils daily.   levocetirizine (XYZAL) 5 MG tablet Take 1 tablet (5 mg total) by mouth 2 (two) times daily as needed for allergies (Can take an extra dose during flare ups.).   linaclotide (LINZESS) 72 MCG capsule Take 1 capsule (72 mcg total) by mouth daily before breakfast.   metroNIDAZOLE (METROGEL) 0.75 % gel Apply 1 Application topically 2 (two) times daily.   Multiple Vitamins-Minerals (MULTIVITAMIN WITH MINERALS) tablet Take 1 tablet by mouth daily.   valACYclovir (VALTREX) 1000 MG tablet TAKE 1 TABLET (1,000 MG TOTAL) BY MOUTH 3 TIMES A DAY FOR 10 DAYS   No facility-administered encounter medications on file as of 12/24/2022.    Allergies  Allergen Reactions   Other Anaphylaxis    Reaction to hay (something in [place] pig's cage), dog and mice   Dust Mite Extract    Lavender Oil Hives   Mold Extract [Trichophyton]     Pertinent ROS per HPI, otherwise unremarkable      Objective:  BP 126/85   Pulse 83   Temp  98.2 F (36.8 C) (Temporal)   Ht 5' 5.95" (1.675 m)   Wt 174 lb 3.2 oz (79 kg)   SpO2 97%   BMI 28.16 kg/m    Wt Readings from Last 3 Encounters:  12/24/22 174 lb 3.2 oz (79 kg)  09/17/22 279 lb 3.2 oz (126.6 kg)  09/03/22 284 lb 9.6 oz (129.1 kg)    Physical Exam Vitals and nursing note reviewed.  Constitutional:      General: She is not in acute distress.    Appearance: Normal appearance. She is obese. She is not ill-appearing, toxic-appearing or diaphoretic.  HENT:     Head: Normocephalic and atraumatic.     Nose: Nose normal.     Mouth/Throat:     Mouth: Mucous membranes are moist.  Eyes:  Conjunctiva/sclera: Conjunctivae normal.     Pupils: Pupils are equal, round, and reactive to light.     Comments: Hooded eyes with inverted lower lid on left   Cardiovascular:     Rate and Rhythm: Normal rate.  Pulmonary:     Effort: Pulmonary effort is normal.  Abdominal:     Palpations: Abdomen is soft.  Skin:    General: Skin is warm and dry.     Capillary Refill: Capillary refill takes less than 2 seconds.     Findings: Rash (perioral and bilateral cheeks - erythematous and dry) present.  Neurological:     General: No focal deficit present.     Mental Status: She is alert and oriented to person, place, and time.  Psychiatric:        Mood and Affect: Mood normal.        Behavior: Behavior normal.        Thought Content: Thought content normal.        Judgment: Judgment normal.    Physical Exam   SKIN: Dryness and flaking on chin, resembling psoriasis patches. Sandpaper-like texture on nose. Erythema where CPAP mask lays.        Results for orders placed or performed in visit on 09/17/22  Anemia Profile B  Result Value Ref Range   Total Iron Binding Capacity 324 250 - 450 ug/dL   UIBC 962 952 - 841 ug/dL   Iron 78 27 - 324 ug/dL   Iron Saturation 24 15 - 55 %   Ferritin 52 15 - 150 ng/mL   Vitamin B-12 115 (L) 232 - 1,245 pg/mL   Folate 4.7 >3.0 ng/mL   WBC  8.7 3.4 - 10.8 x10E3/uL   RBC 5.01 3.77 - 5.28 x10E6/uL   Hemoglobin 14.1 11.1 - 15.9 g/dL   Hematocrit 40.1 02.7 - 46.6 %   MCV 82 79 - 97 fL   MCH 28.1 26.6 - 33.0 pg   MCHC 34.2 31.5 - 35.7 g/dL   RDW 25.3 66.4 - 40.3 %   Platelets 445 150 - 450 x10E3/uL   Neutrophils 65 Not Estab. %   Lymphs 25 Not Estab. %   Monocytes 5 Not Estab. %   Eos 4 Not Estab. %   Basos 1 Not Estab. %   Neutrophils Absolute 5.6 1.4 - 7.0 x10E3/uL   Lymphocytes Absolute 2.2 0.7 - 3.1 x10E3/uL   Monocytes Absolute 0.5 0.1 - 0.9 x10E3/uL   EOS (ABSOLUTE) 0.4 0.0 - 0.4 x10E3/uL   Basophils Absolute 0.1 0.0 - 0.2 x10E3/uL   Immature Granulocytes 0 Not Estab. %   Immature Grans (Abs) 0.0 0.0 - 0.1 x10E3/uL   Retic Ct Pct 1.4 0.6 - 2.6 %  CMP14+EGFR  Result Value Ref Range   Glucose 95 70 - 99 mg/dL   BUN 8 6 - 24 mg/dL   Creatinine, Ser 4.74 0.57 - 1.00 mg/dL   eGFR 90 >25 ZD/GLO/7.56   BUN/Creatinine Ratio 10 9 - 23   Sodium 139 134 - 144 mmol/L   Potassium 4.5 3.5 - 5.2 mmol/L   Chloride 103 96 - 106 mmol/L   CO2 23 20 - 29 mmol/L   Calcium 10.1 8.7 - 10.2 mg/dL   Total Protein 7.3 6.0 - 8.5 g/dL   Albumin 4.4 3.9 - 4.9 g/dL   Globulin, Total 2.9 1.5 - 4.5 g/dL   Bilirubin Total 0.4 0.0 - 1.2 mg/dL   Alkaline Phosphatase 92 44 - 121 IU/L   AST 18  0 - 40 IU/L   ALT 16 0 - 32 IU/L  Hormone Panel  Result Value Ref Range   TSH 2.7 uU/mL   T4 6.6 ug/dL   Free T-3 3.3 pg/mL   Triiodothyronine (T-3), Serum 128 ng/dL   Testosterone, Serum (Total) 19 ng/dL   Free Testosterone, Serum 3.0 pg/mL   Testosterone-% Free 1.6 %   Follicle Stimulating Hormone 4.7 mIU/mL   Progesterone, Serum <10 ng/dL   DHEA-Sulfate, LCMS 74 ug/dL   Sex Hormone Binding Globulin 39.4 nmol/L   Estrone Sulfate 144 ng/dL   Estradiol, Serum, MS 106 pg/mL  Lipid panel  Result Value Ref Range   Cholesterol, Total 141 100 - 199 mg/dL   Triglycerides 253 0 - 149 mg/dL   HDL 41 >66 mg/dL   VLDL Cholesterol Cal 24 5 - 40  mg/dL   LDL Chol Calc (NIH) 76 0 - 99 mg/dL   Chol/HDL Ratio 3.4 0.0 - 4.4 ratio  Bayer DCA Hb A1c Waived  Result Value Ref Range   HB A1C (BAYER DCA - WAIVED) 5.5 4.8 - 5.6 %       Pertinent labs & imaging results that were available during my care of the patient were reviewed by me and considered in my medical decision making.  Assessment & Plan:  Sharmeka was seen today for dry skin on face.  Diagnoses and all orders for this visit:  Facial dermatitis -     metroNIDAZOLE (METROGEL) 0.75 % gel; Apply 1 Application topically 2 (two) times daily.  Hooded upper eyelid -     Ambulatory referral to Plastic Surgery  Irritation of both eyes -     Ambulatory referral to Plastic Surgery  B12 deficiency -     Vitamin B12     Assessment and Plan    Facial Dermatitis Presents with dry, flaky skin on chin and nose, exacerbated by CPAP mask use. Differential includes seborrheic dermatitis and rosacea. Symptoms present for weeks, flaking started recently. Current regimen: hyaluronic acid, retinol, moisturizers; toners cause irritation. Metrogel 0.75% recommended, potential insurance issues noted. 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 Previous consultation with Dr. Jones Broom indicated eyelash irritation. Interested in blepharoplasty due to visual disturbances and irritation. Uncomfortable with surgery but feels necessary for daily activities. - Refer to plastic surgery for blepharoplasty evaluation - Document hooded eyes and visual disturbances - Advise ophthalmologist Dr. Jones Broom to document condition  Vitamin D Deficiency Requests vitamin D level check. Currently taking 10,000 units of methylcobalamin daily. - Order vitamin D level test  General Health  Maintenance Routine health maintenance discussed. - Check vitamin B12 levels  Follow-up - Send Metrogel prescription to [facility] in [place] - Send referral to [facility] for blepharoplasty evaluation - Check vitamin D and B12 levels during this visit.          Continue all other maintenance medications.  Follow up plan: Return if symptoms worsen or fail to improve.   Continue healthy lifestyle choices, including diet (rich in fruits, vegetables, and lean proteins, and low in salt and simple carbohydrates) and exercise (at least 30 minutes of moderate physical activity daily).  Educational handout given for rosacea   The above assessment and management plan was discussed with the patient. The patient verbalized understanding of and  has agreed to the management plan. Patient is aware to call the clinic if they develop any new symptoms or if symptoms persist or worsen. Patient is aware when to return to the clinic for a follow-up visit. Patient educated on when it is appropriate to go to the emergency department.   Kari Baars, FNP-C [facility] [phone]

## 2022-12-25 LAB — VITAMIN B12: Vitamin B-12: >2000 pg/mL — ABNORMAL HIGH (ref 232–1245)

## 2022-12-26 DIAGNOSIS — M9903 Segmental and somatic dysfunction of lumbar region: Secondary | ICD-10-CM | POA: Diagnosis not present

## 2022-12-26 DIAGNOSIS — M546 Pain in thoracic spine: Secondary | ICD-10-CM | POA: Diagnosis not present

## 2022-12-26 DIAGNOSIS — M9902 Segmental and somatic dysfunction of thoracic region: Secondary | ICD-10-CM | POA: Diagnosis not present

## 2022-12-26 DIAGNOSIS — M545 Low back pain, unspecified: Secondary | ICD-10-CM | POA: Diagnosis not present

## 2022-12-26 DIAGNOSIS — M9901 Segmental and somatic dysfunction of cervical region: Secondary | ICD-10-CM | POA: Diagnosis not present

## 2022-12-26 DIAGNOSIS — M542 Cervicalgia: Secondary | ICD-10-CM | POA: Diagnosis not present

## 2022-12-30 DIAGNOSIS — M9903 Segmental and somatic dysfunction of lumbar region: Secondary | ICD-10-CM | POA: Diagnosis not present

## 2022-12-30 DIAGNOSIS — M9901 Segmental and somatic dysfunction of cervical region: Secondary | ICD-10-CM | POA: Diagnosis not present

## 2022-12-30 DIAGNOSIS — M9902 Segmental and somatic dysfunction of thoracic region: Secondary | ICD-10-CM | POA: Diagnosis not present

## 2022-12-30 DIAGNOSIS — M542 Cervicalgia: Secondary | ICD-10-CM | POA: Diagnosis not present

## 2022-12-30 DIAGNOSIS — M546 Pain in thoracic spine: Secondary | ICD-10-CM | POA: Diagnosis not present

## 2022-12-30 DIAGNOSIS — M545 Low back pain, unspecified: Secondary | ICD-10-CM | POA: Diagnosis not present

## 2023-01-07 DIAGNOSIS — M545 Low back pain, unspecified: Secondary | ICD-10-CM | POA: Diagnosis not present

## 2023-01-07 DIAGNOSIS — M9902 Segmental and somatic dysfunction of thoracic region: Secondary | ICD-10-CM | POA: Diagnosis not present

## 2023-01-07 DIAGNOSIS — M542 Cervicalgia: Secondary | ICD-10-CM | POA: Diagnosis not present

## 2023-01-07 DIAGNOSIS — M546 Pain in thoracic spine: Secondary | ICD-10-CM | POA: Diagnosis not present

## 2023-01-07 DIAGNOSIS — M9901 Segmental and somatic dysfunction of cervical region: Secondary | ICD-10-CM | POA: Diagnosis not present

## 2023-01-07 DIAGNOSIS — M9903 Segmental and somatic dysfunction of lumbar region: Secondary | ICD-10-CM | POA: Diagnosis not present

## 2023-01-09 DIAGNOSIS — M9901 Segmental and somatic dysfunction of cervical region: Secondary | ICD-10-CM | POA: Diagnosis not present

## 2023-01-09 DIAGNOSIS — M545 Low back pain, unspecified: Secondary | ICD-10-CM | POA: Diagnosis not present

## 2023-01-09 DIAGNOSIS — M9903 Segmental and somatic dysfunction of lumbar region: Secondary | ICD-10-CM | POA: Diagnosis not present

## 2023-01-09 DIAGNOSIS — M9902 Segmental and somatic dysfunction of thoracic region: Secondary | ICD-10-CM | POA: Diagnosis not present

## 2023-01-09 DIAGNOSIS — M542 Cervicalgia: Secondary | ICD-10-CM | POA: Diagnosis not present

## 2023-01-09 DIAGNOSIS — M546 Pain in thoracic spine: Secondary | ICD-10-CM | POA: Diagnosis not present

## 2023-01-13 DIAGNOSIS — M545 Low back pain, unspecified: Secondary | ICD-10-CM | POA: Diagnosis not present

## 2023-01-13 DIAGNOSIS — M9901 Segmental and somatic dysfunction of cervical region: Secondary | ICD-10-CM | POA: Diagnosis not present

## 2023-01-13 DIAGNOSIS — M546 Pain in thoracic spine: Secondary | ICD-10-CM | POA: Diagnosis not present

## 2023-01-13 DIAGNOSIS — M542 Cervicalgia: Secondary | ICD-10-CM | POA: Diagnosis not present

## 2023-01-13 DIAGNOSIS — M9903 Segmental and somatic dysfunction of lumbar region: Secondary | ICD-10-CM | POA: Diagnosis not present

## 2023-01-13 DIAGNOSIS — M9902 Segmental and somatic dysfunction of thoracic region: Secondary | ICD-10-CM | POA: Diagnosis not present

## 2023-01-20 DIAGNOSIS — M546 Pain in thoracic spine: Secondary | ICD-10-CM | POA: Diagnosis not present

## 2023-01-20 DIAGNOSIS — M545 Low back pain, unspecified: Secondary | ICD-10-CM | POA: Diagnosis not present

## 2023-01-20 DIAGNOSIS — M9903 Segmental and somatic dysfunction of lumbar region: Secondary | ICD-10-CM | POA: Diagnosis not present

## 2023-01-20 DIAGNOSIS — M542 Cervicalgia: Secondary | ICD-10-CM | POA: Diagnosis not present

## 2023-01-20 DIAGNOSIS — M9901 Segmental and somatic dysfunction of cervical region: Secondary | ICD-10-CM | POA: Diagnosis not present

## 2023-01-20 DIAGNOSIS — M9902 Segmental and somatic dysfunction of thoracic region: Secondary | ICD-10-CM | POA: Diagnosis not present

## 2023-01-29 DIAGNOSIS — M542 Cervicalgia: Secondary | ICD-10-CM | POA: Diagnosis not present

## 2023-01-29 DIAGNOSIS — H5203 Hypermetropia, bilateral: Secondary | ICD-10-CM | POA: Diagnosis not present

## 2023-01-29 DIAGNOSIS — M546 Pain in thoracic spine: Secondary | ICD-10-CM | POA: Diagnosis not present

## 2023-01-29 DIAGNOSIS — H5310 Unspecified subjective visual disturbances: Secondary | ICD-10-CM | POA: Diagnosis not present

## 2023-01-29 DIAGNOSIS — M9903 Segmental and somatic dysfunction of lumbar region: Secondary | ICD-10-CM | POA: Diagnosis not present

## 2023-01-29 DIAGNOSIS — M9901 Segmental and somatic dysfunction of cervical region: Secondary | ICD-10-CM | POA: Diagnosis not present

## 2023-01-29 DIAGNOSIS — H52223 Regular astigmatism, bilateral: Secondary | ICD-10-CM | POA: Diagnosis not present

## 2023-01-29 DIAGNOSIS — Q132 Other congenital malformations of iris: Secondary | ICD-10-CM | POA: Diagnosis not present

## 2023-01-29 DIAGNOSIS — H11822 Conjunctivochalasis, left eye: Secondary | ICD-10-CM | POA: Diagnosis not present

## 2023-01-29 DIAGNOSIS — M9902 Segmental and somatic dysfunction of thoracic region: Secondary | ICD-10-CM | POA: Diagnosis not present

## 2023-01-29 DIAGNOSIS — H50332 Intermittent monocular exotropia, left eye: Secondary | ICD-10-CM | POA: Diagnosis not present

## 2023-01-29 DIAGNOSIS — M545 Low back pain, unspecified: Secondary | ICD-10-CM | POA: Diagnosis not present

## 2023-02-03 DIAGNOSIS — F419 Anxiety disorder, unspecified: Secondary | ICD-10-CM | POA: Diagnosis not present

## 2023-02-03 DIAGNOSIS — K219 Gastro-esophageal reflux disease without esophagitis: Secondary | ICD-10-CM | POA: Diagnosis not present

## 2023-02-03 DIAGNOSIS — H53453 Other localized visual field defect, bilateral: Secondary | ICD-10-CM | POA: Diagnosis not present

## 2023-02-03 DIAGNOSIS — J45909 Unspecified asthma, uncomplicated: Secondary | ICD-10-CM | POA: Diagnosis not present

## 2023-02-03 DIAGNOSIS — D649 Anemia, unspecified: Secondary | ICD-10-CM | POA: Diagnosis not present

## 2023-02-03 DIAGNOSIS — G4730 Sleep apnea, unspecified: Secondary | ICD-10-CM | POA: Diagnosis not present

## 2023-02-03 DIAGNOSIS — H02831 Dermatochalasis of right upper eyelid: Secondary | ICD-10-CM | POA: Diagnosis not present

## 2023-02-03 DIAGNOSIS — H02834 Dermatochalasis of left upper eyelid: Secondary | ICD-10-CM | POA: Diagnosis not present

## 2023-02-07 DIAGNOSIS — M542 Cervicalgia: Secondary | ICD-10-CM | POA: Diagnosis not present

## 2023-02-07 DIAGNOSIS — M9903 Segmental and somatic dysfunction of lumbar region: Secondary | ICD-10-CM | POA: Diagnosis not present

## 2023-02-07 DIAGNOSIS — M546 Pain in thoracic spine: Secondary | ICD-10-CM | POA: Diagnosis not present

## 2023-02-07 DIAGNOSIS — M9902 Segmental and somatic dysfunction of thoracic region: Secondary | ICD-10-CM | POA: Diagnosis not present

## 2023-02-07 DIAGNOSIS — M9901 Segmental and somatic dysfunction of cervical region: Secondary | ICD-10-CM | POA: Diagnosis not present

## 2023-02-07 DIAGNOSIS — M545 Low back pain, unspecified: Secondary | ICD-10-CM | POA: Diagnosis not present

## 2023-02-13 DIAGNOSIS — M9903 Segmental and somatic dysfunction of lumbar region: Secondary | ICD-10-CM | POA: Diagnosis not present

## 2023-02-13 DIAGNOSIS — M545 Low back pain, unspecified: Secondary | ICD-10-CM | POA: Diagnosis not present

## 2023-02-13 DIAGNOSIS — M542 Cervicalgia: Secondary | ICD-10-CM | POA: Diagnosis not present

## 2023-02-13 DIAGNOSIS — M9901 Segmental and somatic dysfunction of cervical region: Secondary | ICD-10-CM | POA: Diagnosis not present

## 2023-02-13 DIAGNOSIS — M9902 Segmental and somatic dysfunction of thoracic region: Secondary | ICD-10-CM | POA: Diagnosis not present

## 2023-02-13 DIAGNOSIS — M546 Pain in thoracic spine: Secondary | ICD-10-CM | POA: Diagnosis not present

## 2023-02-20 DIAGNOSIS — M9903 Segmental and somatic dysfunction of lumbar region: Secondary | ICD-10-CM | POA: Diagnosis not present

## 2023-02-20 DIAGNOSIS — M542 Cervicalgia: Secondary | ICD-10-CM | POA: Diagnosis not present

## 2023-02-20 DIAGNOSIS — M545 Low back pain, unspecified: Secondary | ICD-10-CM | POA: Diagnosis not present

## 2023-02-20 DIAGNOSIS — M9902 Segmental and somatic dysfunction of thoracic region: Secondary | ICD-10-CM | POA: Diagnosis not present

## 2023-02-20 DIAGNOSIS — M546 Pain in thoracic spine: Secondary | ICD-10-CM | POA: Diagnosis not present

## 2023-02-20 DIAGNOSIS — M9901 Segmental and somatic dysfunction of cervical region: Secondary | ICD-10-CM | POA: Diagnosis not present

## 2023-09-18 ENCOUNTER — Encounter: Payer: Commercial Managed Care - PPO | Admitting: Family Medicine

## 2023-09-19 ENCOUNTER — Other Ambulatory Visit (HOSPITAL_BASED_OUTPATIENT_CLINIC_OR_DEPARTMENT_OTHER): Payer: Self-pay | Admitting: Family Medicine

## 2023-09-19 DIAGNOSIS — Z1231 Encounter for screening mammogram for malignant neoplasm of breast: Secondary | ICD-10-CM

## 2023-10-15 ENCOUNTER — Telehealth (HOSPITAL_BASED_OUTPATIENT_CLINIC_OR_DEPARTMENT_OTHER): Payer: Self-pay

## 2024-02-11 ENCOUNTER — Encounter: Payer: Self-pay | Admitting: Family Medicine

## 2024-06-11 ENCOUNTER — Encounter: Admitting: Family Medicine
